# Patient Record
Sex: Male | Born: 1965 | Race: White | Hispanic: No | Marital: Single | Smoking: Current every day smoker
Health system: Southern US, Community
[De-identification: ages and names within clinical notes are randomized; demographics above are authoritative.]

## PROBLEM LIST (undated history)

## (undated) DIAGNOSIS — F32A Depression, unspecified: Secondary | ICD-10-CM

## (undated) DIAGNOSIS — S92919A Unspecified fracture of unspecified toe(s), initial encounter for closed fracture: Secondary | ICD-10-CM

## (undated) DIAGNOSIS — R200 Anesthesia of skin: Secondary | ICD-10-CM

## (undated) DIAGNOSIS — Z872 Personal history of diseases of the skin and subcutaneous tissue: Secondary | ICD-10-CM

## (undated) DIAGNOSIS — F329 Major depressive disorder, single episode, unspecified: Secondary | ICD-10-CM

## (undated) HISTORY — PX: INCISION AND DRAINAGE FOOT: SHX1800

## (undated) HISTORY — PX: OTHER SURGICAL HISTORY: SHX169

## (undated) HISTORY — PX: PYLOROMYOTOMY: SHX5274

## (undated) HISTORY — PX: CATARACT EXTRACTION W/ INTRAOCULAR LENS IMPLANT: SHX1309

---

## 1998-04-20 HISTORY — PX: HAND SURGERY: SHX662

## 1999-11-26 ENCOUNTER — Inpatient Hospital Stay (HOSPITAL_COMMUNITY): Admission: RE | Admit: 1999-11-26 | Discharge: 1999-11-29 | Payer: Self-pay | Admitting: Orthopedic Surgery

## 2010-09-11 ENCOUNTER — Emergency Department (HOSPITAL_COMMUNITY)
Admission: EM | Admit: 2010-09-11 | Discharge: 2010-09-11 | Disposition: A | Discharge status: Home / Self Care | Payer: Medicaid Other | Attending: Emergency Medicine | Admitting: Emergency Medicine

## 2010-09-11 DIAGNOSIS — R45851 Suicidal ideations: Secondary | ICD-10-CM | POA: Insufficient documentation

## 2010-09-11 DIAGNOSIS — F411 Generalized anxiety disorder: Secondary | ICD-10-CM | POA: Insufficient documentation

## 2010-09-11 DIAGNOSIS — R443 Hallucinations, unspecified: Secondary | ICD-10-CM | POA: Insufficient documentation

## 2010-09-11 LAB — CBC
Platelets: 249 10*3/uL (ref 150–400)
RDW: 12.4 % (ref 11.5–15.5)
WBC: 10.9 10*3/uL — ABNORMAL HIGH (ref 4.0–10.5)

## 2010-09-11 LAB — DIFFERENTIAL
Basophils Absolute: 0 10*3/uL (ref 0.0–0.1)
Eosinophils Absolute: 0.1 10*3/uL (ref 0.0–0.7)
Eosinophils Relative: 1 % (ref 0–5)

## 2010-09-11 LAB — COMPREHENSIVE METABOLIC PANEL
AST: 29 U/L (ref 0–37)
Alkaline Phosphatase: 89 U/L (ref 39–117)
BUN: 9 mg/dL (ref 6–23)
CO2: 23 mEq/L (ref 19–32)
Chloride: 99 mEq/L (ref 96–112)
Creatinine, Ser: 0.82 mg/dL (ref 0.4–1.5)
GFR calc Af Amer: >60 mL/min (ref >60)
GFR calc non Af Amer: >60 mL/min (ref >60)
Potassium: 3.9 mEq/L (ref 3.5–5.1)
Total Bilirubin: 0.2 mg/dL — ABNORMAL LOW (ref 0.3–1.2)

## 2010-09-11 LAB — RAPID URINE DRUG SCREEN, HOSP PERFORMED
Cocaine: NOT DETECTED
Opiates: NOT DETECTED

## 2014-09-25 ENCOUNTER — Encounter (HOSPITAL_COMMUNITY): Payer: Self-pay

## 2014-09-25 ENCOUNTER — Observation Stay (HOSPITAL_COMMUNITY)
Admission: EM | Admit: 2014-09-25 | Discharge: 2014-09-26 | Disposition: A | Discharge status: Home / Self Care | Payer: Medicaid Other | Attending: Orthopedic Surgery | Admitting: Orthopedic Surgery

## 2014-09-25 DIAGNOSIS — S92411A Displaced fracture of proximal phalanx of right great toe, initial encounter for closed fracture: Secondary | ICD-10-CM | POA: Diagnosis not present

## 2014-09-25 DIAGNOSIS — Y92410 Unspecified street and highway as the place of occurrence of the external cause: Secondary | ICD-10-CM | POA: Diagnosis not present

## 2014-09-25 DIAGNOSIS — Z23 Encounter for immunization: Secondary | ICD-10-CM | POA: Diagnosis not present

## 2014-09-25 DIAGNOSIS — M79671 Pain in right foot: Secondary | ICD-10-CM

## 2014-09-25 DIAGNOSIS — S80211A Abrasion, right knee, initial encounter: Secondary | ICD-10-CM | POA: Insufficient documentation

## 2014-09-25 DIAGNOSIS — S92521A Displaced fracture of medial phalanx of right lesser toe(s), initial encounter for closed fracture: Secondary | ICD-10-CM | POA: Diagnosis not present

## 2014-09-25 DIAGNOSIS — S92531A Displaced fracture of distal phalanx of right lesser toe(s), initial encounter for closed fracture: Secondary | ICD-10-CM | POA: Insufficient documentation

## 2014-09-25 DIAGNOSIS — F17210 Nicotine dependence, cigarettes, uncomplicated: Secondary | ICD-10-CM | POA: Diagnosis not present

## 2014-09-25 DIAGNOSIS — S92919A Unspecified fracture of unspecified toe(s), initial encounter for closed fracture: Secondary | ICD-10-CM

## 2014-09-25 DIAGNOSIS — F329 Major depressive disorder, single episode, unspecified: Secondary | ICD-10-CM | POA: Insufficient documentation

## 2014-09-25 DIAGNOSIS — S99921A Unspecified injury of right foot, initial encounter: Secondary | ICD-10-CM | POA: Diagnosis present

## 2014-09-25 DIAGNOSIS — R200 Anesthesia of skin: Secondary | ICD-10-CM

## 2014-09-25 HISTORY — DX: Anesthesia of skin: R20.0

## 2014-09-25 HISTORY — DX: Unspecified fracture of unspecified toe(s), initial encounter for closed fracture: S92.919A

## 2014-09-25 HISTORY — DX: Depression, unspecified: F32.A

## 2014-09-25 HISTORY — DX: Major depressive disorder, single episode, unspecified: F32.9

## 2014-09-25 MED ORDER — TETANUS-DIPHTH-ACELL PERTUSSIS 5-2.5-18.5 LF-MCG/0.5 IM SUSP
0.5000 mL | Freq: Once | INTRAMUSCULAR | Status: AC
  Administered 2014-09-26: 0.5 mL via INTRAMUSCULAR
  Filled 2014-09-25: qty 0.5
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
## 2014-09-26 ENCOUNTER — Emergency Department (HOSPITAL_COMMUNITY): Payer: Medicaid Other

## 2014-09-26 DIAGNOSIS — S92919A Unspecified fracture of unspecified toe(s), initial encounter for closed fracture: Secondary | ICD-10-CM | POA: Diagnosis present

## 2014-09-26 LAB — BASIC METABOLIC PANEL
ANION GAP: 9 (ref 5–15)
BUN: 8 mg/dL (ref 6–20)
CALCIUM: 8.8 mg/dL — AB (ref 8.9–10.3)
CO2: 23 mmol/L (ref 22–32)
CREATININE: 0.86 mg/dL (ref 0.61–1.24)
Chloride: 105 mmol/L (ref 101–111)
GFR calc Af Amer: >60 mL/min (ref >60)
GFR calc non Af Amer: >60 mL/min (ref >60)
GLUCOSE: 115 mg/dL — AB (ref 65–99)
Potassium: 3.7 mmol/L (ref 3.5–5.1)
Sodium: 137 mmol/L (ref 135–145)

## 2014-09-26 LAB — CBC WITH DIFFERENTIAL/PLATELET
BASOS ABS: 0 10*3/uL (ref 0.0–0.1)
Basophils Relative: 0 % (ref 0–1)
EOS ABS: 0.1 10*3/uL (ref 0.0–0.7)
EOS PCT: 1 % (ref 0–5)
HCT: 41.9 % (ref 39.0–52.0)
Hemoglobin: 14.5 g/dL (ref 13.0–17.0)
LYMPHS ABS: 2.2 10*3/uL (ref 0.7–4.0)
Lymphocytes Relative: 24 % (ref 12–46)
MCH: 30.8 pg (ref 26.0–34.0)
MCHC: 34.6 g/dL (ref 30.0–36.0)
MCV: 89 fL (ref 78.0–100.0)
Monocytes Absolute: 0.4 10*3/uL (ref 0.1–1.0)
Monocytes Relative: 4 % (ref 3–12)
NEUTROS PCT: 71 % (ref 43–77)
Neutro Abs: 6.2 10*3/uL (ref 1.7–7.7)
Platelets: 242 10*3/uL (ref 150–400)
RBC: 4.71 MIL/uL (ref 4.22–5.81)
RDW: 12.5 % (ref 11.5–15.5)
WBC: 8.9 10*3/uL (ref 4.0–10.5)

## 2014-09-26 MED ORDER — SENNOSIDES-DOCUSATE SODIUM 8.6-50 MG PO TABS
1.0000 | ORAL_TABLET | Freq: Every evening | ORAL | Status: DC | PRN
  Filled 2014-09-26: qty 1

## 2014-09-26 MED ORDER — OXYCODONE HCL 5 MG PO TABS
5.0000 mg | ORAL_TABLET | ORAL | Status: DC | PRN
  Administered 2014-09-26: 5 mg via ORAL
  Filled 2014-09-26: qty 1

## 2014-09-26 MED ORDER — HYDROMORPHONE HCL 1 MG/ML IJ SOLN
1.0000 mg | Freq: Once | INTRAMUSCULAR | Status: AC
  Administered 2014-09-26: 1 mg via INTRAVENOUS
  Filled 2014-09-26: qty 1

## 2014-09-26 NOTE — ED Notes (Signed)
Right foot is very swollen and top of his foot is blanched  However good cap refill. States he can't feel when pressure is applied to right 2nd and 4th toe. Dr. Littie DeedsGentry at bedside. Positive pulse with doppler.
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
## 2014-09-26 NOTE — ED Notes (Signed)
Hewitt MD came to see pt.  Reported to RN that pt will be discharged and will follow up with specialist next week for surgery.  Prescription for Percocet given.  ED MD made aware.

## 2014-09-26 NOTE — ED Notes (Signed)
Ice applied to right foot. And elevated

## 2014-09-26 NOTE — ED Notes (Signed)
Pt removed self from all monitoring states, "my heart is good."

## 2014-09-27 ENCOUNTER — Encounter (HOSPITAL_COMMUNITY): Payer: Self-pay | Admitting: Emergency Medicine

## 2014-09-27 ENCOUNTER — Emergency Department (HOSPITAL_COMMUNITY)
Admission: EM | Admit: 2014-09-27 | Discharge: 2014-09-28 | Disposition: A | Discharge status: Home / Self Care | Payer: Medicaid Other | Attending: Emergency Medicine | Admitting: Emergency Medicine

## 2014-09-27 DIAGNOSIS — S7011XD Contusion of right thigh, subsequent encounter: Secondary | ICD-10-CM | POA: Insufficient documentation

## 2014-09-27 DIAGNOSIS — S99921D Unspecified injury of right foot, subsequent encounter: Secondary | ICD-10-CM | POA: Diagnosis not present

## 2014-09-27 DIAGNOSIS — Z720 Tobacco use: Secondary | ICD-10-CM | POA: Insufficient documentation

## 2014-09-27 DIAGNOSIS — S9031XD Contusion of right foot, subsequent encounter: Secondary | ICD-10-CM | POA: Insufficient documentation

## 2014-09-27 DIAGNOSIS — Z8781 Personal history of (healed) traumatic fracture: Secondary | ICD-10-CM | POA: Diagnosis not present

## 2014-09-27 DIAGNOSIS — Z79899 Other long term (current) drug therapy: Secondary | ICD-10-CM | POA: Diagnosis not present

## 2014-09-27 DIAGNOSIS — F329 Major depressive disorder, single episode, unspecified: Secondary | ICD-10-CM | POA: Insufficient documentation

## 2014-09-27 DIAGNOSIS — R200 Anesthesia of skin: Secondary | ICD-10-CM | POA: Diagnosis present
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
## 2014-09-28 LAB — CBC WITH DIFFERENTIAL/PLATELET
BASOS PCT: 0 % (ref 0–1)
Basophils Absolute: 0 10*3/uL (ref 0.0–0.1)
Eosinophils Absolute: 0.3 10*3/uL (ref 0.0–0.7)
Eosinophils Relative: 3 % (ref 0–5)
HCT: 38.9 % — ABNORMAL LOW (ref 39.0–52.0)
Hemoglobin: 13.3 g/dL (ref 13.0–17.0)
LYMPHS ABS: 1.7 10*3/uL (ref 0.7–4.0)
Lymphocytes Relative: 15 % (ref 12–46)
MCH: 31.1 pg (ref 26.0–34.0)
MCHC: 34.2 g/dL (ref 30.0–36.0)
MCV: 90.9 fL (ref 78.0–100.0)
Monocytes Absolute: 0.5 10*3/uL (ref 0.1–1.0)
Monocytes Relative: 5 % (ref 3–12)
Neutro Abs: 8.8 10*3/uL — ABNORMAL HIGH (ref 1.7–7.7)
Neutrophils Relative %: 77 % (ref 43–77)
Platelets: 234 10*3/uL (ref 150–400)
RBC: 4.28 MIL/uL (ref 4.22–5.81)
RDW: 12.7 % (ref 11.5–15.5)
WBC: 11.3 10*3/uL — AB (ref 4.0–10.5)

## 2014-09-28 LAB — BASIC METABOLIC PANEL
Anion gap: 9 (ref 5–15)
BUN: 12 mg/dL (ref 6–20)
CO2: 27 mmol/L (ref 22–32)
CREATININE: 0.9 mg/dL (ref 0.61–1.24)
Calcium: 9.2 mg/dL (ref 8.9–10.3)
Chloride: 99 mmol/L — ABNORMAL LOW (ref 101–111)
GFR calc Af Amer: >60 mL/min (ref >60)
Glucose, Bld: 128 mg/dL — ABNORMAL HIGH (ref 65–99)
Potassium: 3.9 mmol/L (ref 3.5–5.1)
Sodium: 135 mmol/L (ref 135–145)

## 2014-09-28 MED ORDER — CEFAZOLIN SODIUM 1-5 GM-% IV SOLN
1.0000 g | Freq: Once | INTRAVENOUS | Status: AC
  Administered 2014-09-28: 1 g via INTRAVENOUS
  Filled 2014-09-28: qty 50

## 2014-09-28 MED ORDER — MORPHINE SULFATE 4 MG/ML IJ SOLN
4.0000 mg | Freq: Once | INTRAMUSCULAR | Status: AC
  Administered 2014-09-28: 4 mg via INTRAVENOUS
  Filled 2014-09-28: qty 1

## 2014-09-28 MED ORDER — CEPHALEXIN 500 MG PO CAPS: 500.0000 mg | ORAL_CAPSULE | Freq: Four times a day (QID) | ORAL | Status: DC

## 2014-09-28 NOTE — Discharge Instructions (Signed)
Call [facility] orthopedic office in the morning to schedule appointment to be seen tomorrow. Do not bear weight on your right lower extremity. Keep the foot elevated above the heart. Take antibiotics as prescribed. Return immediately for worsening pain, swelling, fever or any concerns.

## 2014-10-11 ENCOUNTER — Emergency Department (HOSPITAL_COMMUNITY)
Admission: EM | Admit: 2014-10-11 | Discharge: 2014-10-11 | Disposition: A | Discharge status: Home / Self Care | Payer: Medicaid Other | Attending: Emergency Medicine | Admitting: Emergency Medicine

## 2014-10-11 ENCOUNTER — Emergency Department (HOSPITAL_COMMUNITY): Payer: Medicaid Other

## 2014-10-11 ENCOUNTER — Encounter (HOSPITAL_COMMUNITY): Payer: Self-pay | Admitting: *Deleted

## 2014-10-11 DIAGNOSIS — S92512D Displaced fracture of proximal phalanx of left lesser toe(s), subsequent encounter for fracture with routine healing: Secondary | ICD-10-CM | POA: Diagnosis not present

## 2014-10-11 DIAGNOSIS — F329 Major depressive disorder, single episode, unspecified: Secondary | ICD-10-CM | POA: Diagnosis not present

## 2014-10-11 DIAGNOSIS — Z79899 Other long term (current) drug therapy: Secondary | ICD-10-CM | POA: Insufficient documentation

## 2014-10-11 DIAGNOSIS — L03119 Cellulitis of unspecified part of limb: Secondary | ICD-10-CM

## 2014-10-11 DIAGNOSIS — S92531D Displaced fracture of distal phalanx of right lesser toe(s), subsequent encounter for fracture with routine healing: Secondary | ICD-10-CM | POA: Insufficient documentation

## 2014-10-11 DIAGNOSIS — Z7982 Long term (current) use of aspirin: Secondary | ICD-10-CM | POA: Insufficient documentation

## 2014-10-11 DIAGNOSIS — L03115 Cellulitis of right lower limb: Secondary | ICD-10-CM | POA: Insufficient documentation

## 2014-10-11 DIAGNOSIS — M71071 Abscess of bursa, right ankle and foot: Secondary | ICD-10-CM | POA: Diagnosis not present

## 2014-10-11 DIAGNOSIS — S92911G Unspecified fracture of right toe(s), subsequent encounter for fracture with delayed healing: Secondary | ICD-10-CM

## 2014-10-11 DIAGNOSIS — Z720 Tobacco use: Secondary | ICD-10-CM | POA: Insufficient documentation

## 2014-10-11 DIAGNOSIS — L02619 Cutaneous abscess of unspecified foot: Secondary | ICD-10-CM

## 2014-10-11 DIAGNOSIS — M79671 Pain in right foot: Secondary | ICD-10-CM | POA: Diagnosis present

## 2014-10-11 LAB — CBC WITH DIFFERENTIAL/PLATELET
BASOS PCT: 0 % (ref 0–1)
Basophils Absolute: 0 10*3/uL (ref 0.0–0.1)
Eosinophils Absolute: 0.3 10*3/uL (ref 0.0–0.7)
Eosinophils Relative: 3 % (ref 0–5)
HCT: 36.4 % — ABNORMAL LOW (ref 39.0–52.0)
Hemoglobin: 12.3 g/dL — ABNORMAL LOW (ref 13.0–17.0)
Lymphocytes Relative: 24 % (ref 12–46)
Lymphs Abs: 2.5 10*3/uL (ref 0.7–4.0)
MCH: 30.4 pg (ref 26.0–34.0)
MCHC: 33.8 g/dL (ref 30.0–36.0)
MCV: 89.9 fL (ref 78.0–100.0)
MONOS PCT: 5 % (ref 3–12)
Monocytes Absolute: 0.5 10*3/uL (ref 0.1–1.0)
NEUTROS ABS: 7.1 10*3/uL (ref 1.7–7.7)
Neutrophils Relative %: 68 % (ref 43–77)
Platelets: 487 10*3/uL — ABNORMAL HIGH (ref 150–400)
RBC: 4.05 MIL/uL — AB (ref 4.22–5.81)
RDW: 12.2 % (ref 11.5–15.5)
WBC: 10.4 10*3/uL (ref 4.0–10.5)

## 2014-10-11 LAB — COMPREHENSIVE METABOLIC PANEL
ALT: 16 U/L — AB (ref 17–63)
AST: 16 U/L (ref 15–41)
Albumin: 3.4 g/dL — ABNORMAL LOW (ref 3.5–5.0)
Alkaline Phosphatase: 53 U/L (ref 38–126)
Anion gap: 12 (ref 5–15)
BUN: 19 mg/dL (ref 6–20)
CALCIUM: 9 mg/dL (ref 8.9–10.3)
CHLORIDE: 107 mmol/L (ref 101–111)
CO2: 20 mmol/L — ABNORMAL LOW (ref 22–32)
Creatinine, Ser: 1.01 mg/dL (ref 0.61–1.24)
GFR calc Af Amer: >60 mL/min (ref >60)
GFR calc non Af Amer: >60 mL/min (ref >60)
Glucose, Bld: 83 mg/dL (ref 65–99)
Potassium: 3.8 mmol/L (ref 3.5–5.1)
SODIUM: 139 mmol/L (ref 135–145)
Total Bilirubin: 0.3 mg/dL (ref 0.3–1.2)
Total Protein: 6.9 g/dL (ref 6.5–8.1)

## 2014-10-11 LAB — I-STAT CG4 LACTIC ACID, ED: Lactic Acid, Venous: 0.73 mmol/L (ref 0.5–2.0)

## 2014-10-11 MED ORDER — CEPHALEXIN 500 MG PO CAPS: 500.0000 mg | ORAL_CAPSULE | Freq: Four times a day (QID) | ORAL | Status: DC

## 2014-10-11 MED ORDER — PIPERACILLIN-TAZOBACTAM 3.375 G IVPB 30 MIN
3.3750 g | Freq: Once | INTRAVENOUS | Status: AC
  Administered 2014-10-11: 3.375 g via INTRAVENOUS
  Filled 2014-10-11: qty 50

## 2014-10-11 MED ORDER — ACETAMINOPHEN 500 MG PO TABS
1000.0000 mg | ORAL_TABLET | Freq: Once | ORAL | Status: AC
  Administered 2014-10-11: 1000 mg via ORAL
  Filled 2014-10-11: qty 2

## 2014-10-11 NOTE — ED Notes (Signed)
Rt foot was cleaned with normal saline and wrapped with vaseline gauze, gauze, kling, and ace wrap.
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
## 2014-10-11 NOTE — Discharge Instructions (Signed)
Take keflex as prescribed.   Take tylenol, motrin for pain.   See Dr. Victorino Dike for follow up.   Return to ER if you have fever, worse swelling, more purulent discharge from the wound.

## 2014-10-21 ENCOUNTER — Emergency Department (HOSPITAL_COMMUNITY): Payer: Medicaid Other

## 2014-10-21 ENCOUNTER — Inpatient Hospital Stay (HOSPITAL_COMMUNITY)
Admission: EM | Admit: 2014-10-21 | Discharge: 2014-10-24 | DRG: 603 | Disposition: A | Discharge status: Home / Self Care | Payer: Medicaid Other | Attending: Internal Medicine | Admitting: Internal Medicine

## 2014-10-21 ENCOUNTER — Encounter (HOSPITAL_COMMUNITY): Payer: Self-pay | Admitting: Emergency Medicine

## 2014-10-21 ENCOUNTER — Inpatient Hospital Stay (HOSPITAL_COMMUNITY): Payer: Medicaid Other

## 2014-10-21 DIAGNOSIS — S92911A Unspecified fracture of right toe(s), initial encounter for closed fracture: Secondary | ICD-10-CM | POA: Diagnosis present

## 2014-10-21 DIAGNOSIS — F32A Depression, unspecified: Secondary | ICD-10-CM | POA: Diagnosis present

## 2014-10-21 DIAGNOSIS — D649 Anemia, unspecified: Secondary | ICD-10-CM | POA: Diagnosis present

## 2014-10-21 DIAGNOSIS — Z720 Tobacco use: Secondary | ICD-10-CM | POA: Diagnosis present

## 2014-10-21 DIAGNOSIS — F17210 Nicotine dependence, cigarettes, uncomplicated: Secondary | ICD-10-CM | POA: Diagnosis present

## 2014-10-21 DIAGNOSIS — L089 Local infection of the skin and subcutaneous tissue, unspecified: Secondary | ICD-10-CM

## 2014-10-21 DIAGNOSIS — L0390 Cellulitis, unspecified: Secondary | ICD-10-CM

## 2014-10-21 DIAGNOSIS — B9689 Other specified bacterial agents as the cause of diseases classified elsewhere: Secondary | ICD-10-CM | POA: Diagnosis not present

## 2014-10-21 DIAGNOSIS — B9562 Methicillin resistant Staphylococcus aureus infection as the cause of diseases classified elsewhere: Secondary | ICD-10-CM | POA: Diagnosis not present

## 2014-10-21 DIAGNOSIS — Z872 Personal history of diseases of the skin and subcutaneous tissue: Secondary | ICD-10-CM

## 2014-10-21 DIAGNOSIS — X58XXXS Exposure to other specified factors, sequela: Secondary | ICD-10-CM | POA: Diagnosis present

## 2014-10-21 DIAGNOSIS — L03115 Cellulitis of right lower limb: Secondary | ICD-10-CM | POA: Diagnosis present

## 2014-10-21 DIAGNOSIS — Z885 Allergy status to narcotic agent status: Secondary | ICD-10-CM | POA: Diagnosis not present

## 2014-10-21 DIAGNOSIS — S8011XA Contusion of right lower leg, initial encounter: Secondary | ICD-10-CM | POA: Diagnosis present

## 2014-10-21 DIAGNOSIS — F329 Major depressive disorder, single episode, unspecified: Secondary | ICD-10-CM | POA: Diagnosis present

## 2014-10-21 DIAGNOSIS — S92911G Unspecified fracture of right toe(s), subsequent encounter for fracture with delayed healing: Secondary | ICD-10-CM

## 2014-10-21 DIAGNOSIS — L02415 Cutaneous abscess of right lower limb: Secondary | ICD-10-CM | POA: Diagnosis not present

## 2014-10-21 DIAGNOSIS — S91301S Unspecified open wound, right foot, sequela: Secondary | ICD-10-CM | POA: Diagnosis not present

## 2014-10-21 DIAGNOSIS — S92919A Unspecified fracture of unspecified toe(s), initial encounter for closed fracture: Secondary | ICD-10-CM | POA: Diagnosis present

## 2014-10-21 DIAGNOSIS — M86171 Other acute osteomyelitis, right ankle and foot: Secondary | ICD-10-CM | POA: Diagnosis not present

## 2014-10-21 HISTORY — DX: Personal history of diseases of the skin and subcutaneous tissue: Z87.2

## 2014-10-21 LAB — CBC
HEMATOCRIT: 35.3 % — AB (ref 39.0–52.0)
Hemoglobin: 12.1 g/dL — ABNORMAL LOW (ref 13.0–17.0)
MCH: 30.2 pg (ref 26.0–34.0)
MCHC: 34.3 g/dL (ref 30.0–36.0)
MCV: 88 fL (ref 78.0–100.0)
Platelets: 346 10*3/uL (ref 150–400)
RBC: 4.01 MIL/uL — AB (ref 4.22–5.81)
RDW: 12.5 % (ref 11.5–15.5)
WBC: 10.4 10*3/uL (ref 4.0–10.5)

## 2014-10-21 LAB — BASIC METABOLIC PANEL
Anion gap: 10 (ref 5–15)
BUN: 11 mg/dL (ref 6–20)
CHLORIDE: 98 mmol/L — AB (ref 101–111)
CO2: 23 mmol/L (ref 22–32)
Calcium: 8.5 mg/dL — ABNORMAL LOW (ref 8.9–10.3)
Creatinine, Ser: 0.83 mg/dL (ref 0.61–1.24)
GFR calc Af Amer: >60 mL/min (ref >60)
GFR calc non Af Amer: >60 mL/min (ref >60)
GLUCOSE: 86 mg/dL (ref 65–99)
POTASSIUM: 3.6 mmol/L (ref 3.5–5.1)
SODIUM: 131 mmol/L — AB (ref 135–145)

## 2014-10-21 MED ORDER — ONDANSETRON HCL 4 MG/2ML IJ SOLN
4.0000 mg | Freq: Once | INTRAMUSCULAR | Status: AC
  Administered 2014-10-21: 4 mg via INTRAVENOUS
  Filled 2014-10-21: qty 2

## 2014-10-21 MED ORDER — VANCOMYCIN HCL IN DEXTROSE 1-5 GM/200ML-% IV SOLN
1000.0000 mg | Freq: Three times a day (TID) | INTRAVENOUS | Status: DC
  Administered 2014-10-21 – 2014-10-24 (×8): 1000 mg via INTRAVENOUS
  Filled 2014-10-21 (×10): qty 200

## 2014-10-21 MED ORDER — PIPERACILLIN-TAZOBACTAM 3.375 G IVPB
3.3750 g | Freq: Three times a day (TID) | INTRAVENOUS | Status: DC
  Administered 2014-10-22 – 2014-10-24 (×7): 3.375 g via INTRAVENOUS
  Filled 2014-10-21 (×10): qty 50

## 2014-10-21 MED ORDER — MORPHINE SULFATE 4 MG/ML IJ SOLN
4.0000 mg | INTRAMUSCULAR | Status: DC | PRN
  Administered 2014-10-21: 4 mg via INTRAVENOUS
  Filled 2014-10-21: qty 1

## 2014-10-21 MED ORDER — PIPERACILLIN-TAZOBACTAM 3.375 G IVPB 30 MIN
3.3750 g | Freq: Once | INTRAVENOUS | Status: AC
  Administered 2014-10-22: 3.375 g via INTRAVENOUS
  Filled 2014-10-21: qty 50

## 2014-10-21 NOTE — ED Notes (Signed)
Pt now being transported to MRI first and then to [facility] after MRI.
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
## 2014-10-21 NOTE — Progress Notes (Addendum)
ANTIBIOTIC CONSULT NOTE - INITIAL  Pharmacy Consult for Vancomycin and Zosyn Indication: Pneumonia? Cellulitis?  No Known Allergies  Patient Measurements: Height: 5\' 8"  (172.7 cm) Weight: 211 lb 3 oz (95.794 kg) IBW/kg (Calculated) : 68.4  Vital Signs: Temp: 97.8 F (36.6 C) (07/03 1934) Temp Source: Oral (07/03 1934) BP: 133/69 mmHg (07/03 2043) Pulse Rate: 72 (07/03 2043) Intake/Output from previous day:   Intake/Output from this shift:    Labs:  Recent Labs  10/21/14 1940  WBC 10.4  HGB 12.1*  PLT 346  CREATININE 0.83   Estimated Creatinine Clearance: 122.2 mL/min (by C-G formula based on Cr of 0.83). No results for input(s): VANCOTROUGH, VANCOPEAK, VANCORANDOM, GENTTROUGH, GENTPEAK, GENTRANDOM, TOBRATROUGH, TOBRAPEAK, TOBRARND, AMIKACINPEAK, AMIKACINTROU, AMIKACIN in the last 72 hours.   Microbiology: No results found for this or any previous visit (from the past 720 hour(s)).  Medical History: Past Medical History  Diagnosis Date  . Depression     Assessment: [age] YOM with worsening right foot wound infection for several weeks, with swelling and drainage, no improvement with po keflex. Pharmacy to dose vancomycin. Scr 0.83, est. crcl > 100 ml/min   Goal of Therapy:  Vancomycin trough level 10-15 mcg/ml  Plan:  - Vancomycin 1g IV Q 8 hrs - Monitor renal function, f/u clinical progress and additional indications - vancomycin trough at steady state if needed.  Bayard HuggerMei Bell, PharmD, BCPS  Clinical Pharmacist  Pager: [phone]   10/21/2014,9:04 PM   ADDENDUM: Now to broaden IV ABX w/ Zosyn.  Will start Zosyn 3.375g IV Q8H for CrCl >100. Vernard GamblesVeronda Kristyna Bradstreet, PharmD, BCPS 10/21/2014 11:55 PM
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
## 2014-10-21 NOTE — ED Notes (Signed)
Pt. reports worsening right foot/toes wound infection for several weeks with swelling and drainage , pt. stated he has been treated with oral and IV antibiotics with no improvement , did not follow up with his MD due to financial/insurance constraints.

## 2014-10-22 ENCOUNTER — Encounter (HOSPITAL_COMMUNITY): Payer: Self-pay | Admitting: Internal Medicine

## 2014-10-22 DIAGNOSIS — S92911S Unspecified fracture of right toe(s), sequela: Secondary | ICD-10-CM

## 2014-10-22 DIAGNOSIS — Z720 Tobacco use: Secondary | ICD-10-CM

## 2014-10-22 DIAGNOSIS — F329 Major depressive disorder, single episode, unspecified: Secondary | ICD-10-CM

## 2014-10-22 LAB — TYPE AND SCREEN
ABO/RH(D): A POS
Antibody Screen: NEGATIVE

## 2014-10-22 LAB — RAPID URINE DRUG SCREEN, HOSP PERFORMED
AMPHETAMINES: NOT DETECTED
BENZODIAZEPINES: NOT DETECTED
Barbiturates: NOT DETECTED
COCAINE: NOT DETECTED
OPIATES: POSITIVE — AB
Tetrahydrocannabinol: POSITIVE — AB

## 2014-10-22 LAB — PROCALCITONIN

## 2014-10-22 LAB — CBC
HCT: 34.9 % — ABNORMAL LOW (ref 39.0–52.0)
Hemoglobin: 11.7 g/dL — ABNORMAL LOW (ref 13.0–17.0)
MCH: 29.8 pg (ref 26.0–34.0)
MCHC: 33.5 g/dL (ref 30.0–36.0)
MCV: 89 fL (ref 78.0–100.0)
Platelets: 295 10*3/uL (ref 150–400)
RBC: 3.92 MIL/uL — AB (ref 4.22–5.81)
RDW: 12.7 % (ref 11.5–15.5)
WBC: 9.1 10*3/uL (ref 4.0–10.5)

## 2014-10-22 LAB — COMPREHENSIVE METABOLIC PANEL
ALK PHOS: 56 U/L (ref 38–126)
ALT: 16 U/L — ABNORMAL LOW (ref 17–63)
AST: 14 U/L — ABNORMAL LOW (ref 15–41)
Albumin: 2.7 g/dL — ABNORMAL LOW (ref 3.5–5.0)
Anion gap: 7 (ref 5–15)
BUN: 10 mg/dL (ref 6–20)
CO2: 26 mmol/L (ref 22–32)
CREATININE: 0.85 mg/dL (ref 0.61–1.24)
Calcium: 8.5 mg/dL — ABNORMAL LOW (ref 8.9–10.3)
Chloride: 105 mmol/L (ref 101–111)
GFR calc Af Amer: >60 mL/min (ref >60)
Glucose, Bld: 103 mg/dL — ABNORMAL HIGH (ref 65–99)
POTASSIUM: 4.2 mmol/L (ref 3.5–5.1)
Sodium: 138 mmol/L (ref 135–145)
Total Bilirubin: 0.4 mg/dL (ref 0.3–1.2)
Total Protein: 5.7 g/dL — ABNORMAL LOW (ref 6.5–8.1)

## 2014-10-22 LAB — ABO/RH: ABO/RH(D): A POS

## 2014-10-22 LAB — LACTIC ACID, PLASMA
LACTIC ACID, VENOUS: 0.9 mmol/L (ref 0.5–2.0)
Lactic Acid, Venous: 0.7 mmol/L (ref 0.5–2.0)

## 2014-10-22 LAB — PROTIME-INR
INR: 1.02 (ref 0.00–1.49)
PROTHROMBIN TIME: 13.6 s (ref 11.6–15.2)

## 2014-10-22 LAB — GLUCOSE, CAPILLARY: Glucose-Capillary: 109 mg/dL — ABNORMAL HIGH (ref 65–99)

## 2014-10-22 LAB — APTT: aPTT: 35 seconds (ref 24–37)

## 2014-10-22 LAB — HIV ANTIBODY (ROUTINE TESTING W REFLEX): HIV SCREEN 4TH GENERATION: NONREACTIVE

## 2014-10-22 LAB — SURGICAL PCR SCREEN
MRSA, PCR: NEGATIVE
Staphylococcus aureus: NEGATIVE

## 2014-10-22 MED ORDER — HEPARIN SODIUM (PORCINE) 5000 UNIT/ML IJ SOLN
5000.0000 [IU] | Freq: Three times a day (TID) | INTRAMUSCULAR | Status: DC
  Administered 2014-10-22 – 2014-10-24 (×7): 5000 [IU] via SUBCUTANEOUS
  Filled 2014-10-22 (×7): qty 1

## 2014-10-22 MED ORDER — MORPHINE SULFATE 2 MG/ML IJ SOLN
2.0000 mg | INTRAMUSCULAR | Status: AC | PRN
  Administered 2014-10-22 (×2): 2 mg via INTRAVENOUS
  Filled 2014-10-22 (×2): qty 1

## 2014-10-22 MED ORDER — ALUM & MAG HYDROXIDE-SIMETH 200-200-20 MG/5ML PO SUSP: 30.0000 mL | Freq: Four times a day (QID) | ORAL | Status: DC | PRN

## 2014-10-22 MED ORDER — NICOTINE 21 MG/24HR TD PT24
21.0000 mg | MEDICATED_PATCH | Freq: Every day | TRANSDERMAL | Status: DC
  Administered 2014-10-22: 21 mg via TRANSDERMAL
  Filled 2014-10-22 (×3): qty 1

## 2014-10-22 MED ORDER — ACETAMINOPHEN 650 MG RE SUPP
650.0000 mg | Freq: Four times a day (QID) | RECTAL | Status: DC | PRN
Start: 2014-10-22 — End: 2014-10-24

## 2014-10-22 MED ORDER — OXYCODONE-ACETAMINOPHEN 5-325 MG PO TABS
2.0000 | ORAL_TABLET | Freq: Four times a day (QID) | ORAL | Status: DC | PRN
  Administered 2014-10-22: 2 via ORAL
  Filled 2014-10-22: qty 2

## 2014-10-22 MED ORDER — ACETAMINOPHEN 325 MG PO TABS
650.0000 mg | ORAL_TABLET | Freq: Four times a day (QID) | ORAL | Status: DC | PRN
  Administered 2014-10-22: 650 mg via ORAL
  Filled 2014-10-22: qty 2

## 2014-10-22 MED ORDER — SODIUM CHLORIDE 0.9 % IV BOLUS (SEPSIS): 1000.0000 mL | Freq: Once | INTRAVENOUS | Status: DC

## 2014-10-22 MED ORDER — OXYCODONE-ACETAMINOPHEN 5-325 MG PO TABS
1.0000 | ORAL_TABLET | Freq: Four times a day (QID) | ORAL | Status: DC | PRN
  Administered 2014-10-23 – 2014-10-24 (×5): 2 via ORAL
  Filled 2014-10-22 (×6): qty 2

## 2014-10-22 MED ORDER — ONDANSETRON HCL 4 MG/2ML IJ SOLN: 4.0000 mg | Freq: Three times a day (TID) | INTRAMUSCULAR | Status: DC | PRN

## 2014-10-22 MED ORDER — CITALOPRAM HYDROBROMIDE 20 MG PO TABS
20.0000 mg | ORAL_TABLET | Freq: Every day | ORAL | Status: DC
  Administered 2014-10-22 – 2014-10-24 (×3): 20 mg via ORAL
  Filled 2014-10-22 (×3): qty 1

## 2014-10-22 MED ORDER — SODIUM CHLORIDE 0.9 % IV SOLN
INTRAVENOUS | Status: DC
  Administered 2014-10-22: 1 mL via INTRAVENOUS

## 2014-10-22 NOTE — Care Management (Signed)
UR completed 

## 2014-10-22 NOTE — Care Management (Signed)
Important Message  Patient Details  Name: [name] [name] MRN: [id number] Date of Birth: [birth year]   Medicare Important Message Given:  Yes-second notification given    Bernadette HoitShoffner, Riddik Senna Coleman 10/22/2014, 10:07 AM
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
## 2014-10-23 ENCOUNTER — Inpatient Hospital Stay (HOSPITAL_COMMUNITY): Payer: Medicaid Other

## 2014-10-23 DIAGNOSIS — S92911D Unspecified fracture of right toe(s), subsequent encounter for fracture with routine healing: Secondary | ICD-10-CM

## 2014-10-23 DIAGNOSIS — M7989 Other specified soft tissue disorders: Secondary | ICD-10-CM

## 2014-10-23 DIAGNOSIS — M79609 Pain in unspecified limb: Secondary | ICD-10-CM

## 2014-10-23 DIAGNOSIS — D649 Anemia, unspecified: Secondary | ICD-10-CM

## 2014-10-23 LAB — GLUCOSE, CAPILLARY: GLUCOSE-CAPILLARY: 112 mg/dL — AB (ref 65–99)

## 2014-10-23 NOTE — Care Management Note (Signed)
Case Management Note  Patient Details  Name: [name] [name] MRN: [id number] Date of Birth: [birth year]  Subjective/Objective:                    Action/Plan: [facility] . Provided patient with MATCH letter , patient aware pain medications not covered . Patient states he has no insurance .   Expected Discharge Date:                  Expected Discharge Plan:  Home/Self Care  In-House Referral:     Discharge planning Services  CM Consult, MATCH Program, Medication Assistance, Indigent Health Clinic  Post Acute Care Choice:  Durable Medical Equipment Choice offered to:     DME Arranged:  Walker rolling DME Agency:  [facility]  [facility]     Status of Service:  Completed, signed off  Medicare Important Message Given:  Yes-second notification given Date Medicare IM Given:    Medicare IM give by:    Date Additional Medicare IM Given:    Additional Medicare Important Message give by:     If discussed at Long Length of Stay Meetings, dates discussed:    Additional Comments:  Kingsley PlanWile, Casy Brunetto Marie, RN 10/23/2014, 3:08 PM
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
## 2014-10-23 NOTE — Progress Notes (Signed)
Preliminary results by tech -  Right Lower Ext. Venous Duplex Completed. Negative for deep and superficial vein thrombosis in the right leg. A large hematoma greater than 11 cm is noted in the distal thigh/knee area.  Marilynne Halstedita Lalena Salas, BS, RDMS, RVT

## 2014-10-24 DIAGNOSIS — S92911A Unspecified fracture of right toe(s), initial encounter for closed fracture: Secondary | ICD-10-CM

## 2014-10-24 DIAGNOSIS — L03115 Cellulitis of right lower limb: Principal | ICD-10-CM

## 2014-10-24 LAB — CBC
HCT: 36.7 % — ABNORMAL LOW (ref 39.0–52.0)
Hemoglobin: 12.3 g/dL — ABNORMAL LOW (ref 13.0–17.0)
MCH: 30.1 pg (ref 26.0–34.0)
MCHC: 33.5 g/dL (ref 30.0–36.0)
MCV: 89.7 fL (ref 78.0–100.0)
Platelets: 328 10*3/uL (ref 150–400)
RBC: 4.09 MIL/uL — ABNORMAL LOW (ref 4.22–5.81)
RDW: 12.8 % (ref 11.5–15.5)
WBC: 8.9 10*3/uL (ref 4.0–10.5)

## 2014-10-24 LAB — GLUCOSE, CAPILLARY: Glucose-Capillary: 102 mg/dL — ABNORMAL HIGH (ref 65–99)

## 2014-10-24 LAB — VANCOMYCIN, TROUGH: Vancomycin Tr: 20 ug/mL (ref 10.0–20.0)

## 2014-10-24 MED ORDER — VANCOMYCIN HCL IN DEXTROSE 1-5 GM/200ML-% IV SOLN
1000.0000 mg | Freq: Two times a day (BID) | INTRAVENOUS | Status: DC
  Filled 2014-10-24: qty 200

## 2014-10-24 MED ORDER — IBUPROFEN 200 MG PO TABS: 800.0000 mg | ORAL_TABLET | Freq: Three times a day (TID) | ORAL | Status: DC | PRN

## 2014-10-24 MED ORDER — SULFAMETHOXAZOLE-TRIMETHOPRIM 800-160 MG PO TABS: 1.0000 | ORAL_TABLET | Freq: Two times a day (BID) | ORAL | Status: DC

## 2014-10-24 NOTE — Progress Notes (Signed)
Subjective: Pt says his foot is feeling much better.  Denies f/c//n/v/.   Objective: Vital signs in last 24 hours: Temp:  [98.2 F (36.8 C)-98.9 F (37.2 C)] 98.4 F (36.9 C) (07/06 0558) Pulse Rate:  [78-96] 96 (07/06 0558) Resp:  [16-18] 16 (07/06 0558) BP: (114-117)/(47-66) 115/54 mmHg (07/06 0558) SpO2:  [96 %-98 %] 97 % (07/06 0558)  Intake/Output from previous day: 07/05 0701 - 07/06 0700 In: 702 [P.O.:702] Out: -  Intake/Output this shift:     Recent Labs  10/21/14 1940 10/22/14 0311  HGB 12.1* 11.7*    Recent Labs  10/21/14 1940 10/22/14 0311  WBC 10.4 9.1  RBC 4.01* 3.92*  HCT 35.3* 34.9*  PLT 346 295    Recent Labs  10/21/14 1940 10/22/14 0311  NA 131* 138  K 3.6 4.2  CL 98* 105  CO2 23 26  BUN 11 10  CREATININE 0.83 0.85  GLUCOSE 86 103*  CALCIUM 8.5* 8.5*    Recent Labs  10/22/14 0128  INR 1.02    PE:  wn wd male i nad.  Foot is dressed and dry.  3rd toe with eschar.  No lymphangiitis  Assessment/Plan: R foot cellulitis after crush injury and multiple forefoot fractures - continue NWB immobilization and elevation.  Oral abx per hospitalist.  We'll plan OR for next Thursday for the hallux and hopefully debride some of the dead skin then.   Toni ArthursHEWITT, Vernetta Dizdarevic 10/24/2014, 7:41 AM

## 2014-10-24 NOTE — Progress Notes (Signed)
ANTIBIOTIC CONSULT NOTE - FOLLOW UP  Pharmacy Consult:  Vancomycin / Zosyn Indication:  Cellulitis  Allergies  Allergen Reactions  . Hydrocodone-Acetaminophen     Constipation, bleeding when urinating    Patient Measurements: Height: 5\' 8"  (172.7 cm) Weight: 213 lb 3 oz (96.7 kg) IBW/kg (Calculated) : 68.4  Vital Signs: Temp: 98.4 F (36.9 C) (07/06 0558) Temp Source: Oral (07/06 0558) BP: 115/54 mmHg (07/06 0558) Pulse Rate: 96 (07/06 0558) Intake/Output from previous day: 07/05 0701 - 07/06 0700 In: 702 [P.O.:702] Out: -   Labs:  Recent Labs  10/21/14 1940 10/22/14 0311  WBC 10.4 9.1  HGB 12.1* 11.7*  PLT 346 295  CREATININE 0.83 0.85   Estimated Creatinine Clearance: 119.8 mL/min (by C-G formula based on Cr of 0.85). No results for input(s): VANCOTROUGH, VANCOPEAK, VANCORANDOM, GENTTROUGH, GENTPEAK, GENTRANDOM, TOBRATROUGH, TOBRAPEAK, TOBRARND, AMIKACINPEAK, AMIKACINTROU, AMIKACIN in the last 72 hours.   Microbiology: Recent Results (from the past 720 hour(s))  Surgical pcr screen     Status: None   Collection Time: 10/22/14 12:52 AM  Result Value Ref Range Status   MRSA, PCR NEGATIVE NEGATIVE Final   Staphylococcus aureus NEGATIVE NEGATIVE Final    Comment:        The Xpert SA Assay (FDA approved for NASAL specimens in patients over 49 years of age), is one component of a comprehensive surveillance program.  Test performance has been validated by [facility] for patients greater than or equal to 49 year old. It is not intended to diagnose infection nor to guide or monitor treatment.   Culture, blood (x 2)     Status: None (Preliminary result)   Collection Time: 10/22/14  1:28 AM  Result Value Ref Range Status   Specimen Description BLOOD LEFT ANTECUBITAL  Final   Special Requests BOTTLES DRAWN AEROBIC AND ANAEROBIC 10CC  Final   Culture NO GROWTH 1 DAY  Final   Report Status PENDING  Incomplete  Culture, blood (x 2)     Status: None (Preliminary  result)   Collection Time: 10/22/14  1:38 AM  Result Value Ref Range Status   Specimen Description BLOOD LEFT HAND  Final   Special Requests BOTTLES DRAWN AEROBIC ONLY 10CC  Final   Culture NO GROWTH 1 DAY  Final   Report Status PENDING  Incomplete      Assessment: [age] YOM presented with right foot and leg pain, swelling, and redness.  Pharmacy consulted to manage vancomycin and Zosyn for cellulitis.  MRI negative for osteomyelitis.  Patient's renal function is stable and vancomycin trough is supra-therapeutic.  Vanc 7/3 >> Zosyn 7/4 >>  7/6 VT = 20 mcg/mL  7/4 BCx2 - NGTD 6/4 MRSA - negative   Goal of Therapy:  Vancomycin trough level 10-15 mcg/ml   Plan:  - Change vanc to 1gm IV Q12H - Continue Zosyn 3.375gm IV Q8H, 4 hr infusion - Monitor renal fxn, clinical progress, repeat vanc trough if continues on therapy   Jonuel Butterfield D. Laney Potashang, PharmD, BCPS Pager:  [phone] 10/24/2014, 7:40 AM
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
## 2014-10-26 ENCOUNTER — Other Ambulatory Visit: Payer: Self-pay | Admitting: Orthopedic Surgery

## 2014-10-27 LAB — CULTURE, BLOOD (ROUTINE X 2)
CULTURE: NO GROWTH
Culture: NO GROWTH

## 2014-10-29 ENCOUNTER — Encounter (HOSPITAL_BASED_OUTPATIENT_CLINIC_OR_DEPARTMENT_OTHER): Payer: Self-pay | Admitting: *Deleted

## 2014-11-01 ENCOUNTER — Encounter (HOSPITAL_BASED_OUTPATIENT_CLINIC_OR_DEPARTMENT_OTHER): Payer: Self-pay

## 2014-11-01 ENCOUNTER — Ambulatory Visit (HOSPITAL_BASED_OUTPATIENT_CLINIC_OR_DEPARTMENT_OTHER): Payer: Medicaid Other | Admitting: Certified Registered"

## 2014-11-01 ENCOUNTER — Ambulatory Visit (HOSPITAL_BASED_OUTPATIENT_CLINIC_OR_DEPARTMENT_OTHER)
Admission: RE | Admit: 2014-11-01 | Discharge: 2014-11-01 | Disposition: A | Discharge status: Home / Self Care | Payer: Medicaid Other | Source: Ambulatory Visit | Attending: Orthopedic Surgery | Admitting: Orthopedic Surgery

## 2014-11-01 ENCOUNTER — Encounter (HOSPITAL_BASED_OUTPATIENT_CLINIC_OR_DEPARTMENT_OTHER)
Admission: RE | Disposition: A | Discharge status: Home / Self Care | Payer: Self-pay | Source: Ambulatory Visit | Attending: Orthopedic Surgery

## 2014-11-01 DIAGNOSIS — Z791 Long term (current) use of non-steroidal anti-inflammatories (NSAID): Secondary | ICD-10-CM | POA: Insufficient documentation

## 2014-11-01 DIAGNOSIS — F329 Major depressive disorder, single episode, unspecified: Secondary | ICD-10-CM | POA: Insufficient documentation

## 2014-11-01 DIAGNOSIS — S92411A Displaced fracture of proximal phalanx of right great toe, initial encounter for closed fracture: Secondary | ICD-10-CM | POA: Diagnosis present

## 2014-11-01 DIAGNOSIS — S91301A Unspecified open wound, right foot, initial encounter: Secondary | ICD-10-CM | POA: Diagnosis not present

## 2014-11-01 DIAGNOSIS — S9781XA Crushing injury of right foot, initial encounter: Secondary | ICD-10-CM | POA: Insufficient documentation

## 2014-11-01 DIAGNOSIS — F17210 Nicotine dependence, cigarettes, uncomplicated: Secondary | ICD-10-CM | POA: Insufficient documentation

## 2014-11-01 HISTORY — PX: I&D EXTREMITY: SHX5045

## 2014-11-01 HISTORY — DX: Anesthesia of skin: R20.0

## 2014-11-01 HISTORY — DX: Unspecified fracture of unspecified toe(s), initial encounter for closed fracture: S92.919A

## 2014-11-01 HISTORY — PX: APPLICATION OF A-CELL OF EXTREMITY: SHX6303

## 2014-11-01 HISTORY — PX: ORIF TOE FRACTURE: SHX5032

## 2014-11-01 HISTORY — DX: Personal history of diseases of the skin and subcutaneous tissue: Z87.2

## 2014-11-01 LAB — POCT HEMOGLOBIN-HEMACUE: HEMOGLOBIN: 13.2 g/dL (ref 13.0–17.0)

## 2014-11-01 SURGERY — OPEN REDUCTION INTERNAL FIXATION (ORIF) METATARSAL (TOE) FRACTURE
Anesthesia: General | Site: Toe | Laterality: Right

## 2014-11-01 MED ORDER — OXYCODONE HCL 5 MG PO TABS: 5.0000 mg | ORAL_TABLET | Freq: Once | ORAL | Status: DC | PRN

## 2014-11-01 MED ORDER — MEPERIDINE HCL 25 MG/ML IJ SOLN: 6.2500 mg | INTRAMUSCULAR | Status: DC | PRN

## 2014-11-01 MED ORDER — GLYCOPYRROLATE 0.2 MG/ML IJ SOLN: 0.2000 mg | Freq: Once | INTRAMUSCULAR | Status: DC | PRN

## 2014-11-01 MED ORDER — HYDROMORPHONE HCL 1 MG/ML IJ SOLN
INTRAMUSCULAR | Status: AC
  Filled 2014-11-01: qty 1

## 2014-11-01 MED ORDER — MIDAZOLAM HCL 2 MG/2ML IJ SOLN
1.0000 mg | INTRAMUSCULAR | Status: DC | PRN
  Administered 2014-11-01: 2 mg via INTRAVENOUS

## 2014-11-01 MED ORDER — PROPOFOL 10 MG/ML IV BOLUS
INTRAVENOUS | Status: DC | PRN
  Administered 2014-11-01: 200 mg via INTRAVENOUS

## 2014-11-01 MED ORDER — LACTATED RINGERS IV SOLN
INTRAVENOUS | Status: DC
  Administered 2014-11-01 (×2): via INTRAVENOUS
  Administered 2014-11-01: 10 mL/h via INTRAVENOUS

## 2014-11-01 MED ORDER — 0.9 % SODIUM CHLORIDE (POUR BTL) OPTIME
TOPICAL | Status: DC | PRN
  Administered 2014-11-01: 500 mL

## 2014-11-01 MED ORDER — MIDAZOLAM HCL 2 MG/2ML IJ SOLN
INTRAMUSCULAR | Status: AC
  Filled 2014-11-01: qty 2

## 2014-11-01 MED ORDER — FENTANYL CITRATE (PF) 100 MCG/2ML IJ SOLN
INTRAMUSCULAR | Status: AC
  Filled 2014-11-01: qty 6

## 2014-11-01 MED ORDER — SCOPOLAMINE 1 MG/3DAYS TD PT72: 1.0000 | MEDICATED_PATCH | Freq: Once | TRANSDERMAL | Status: DC | PRN

## 2014-11-01 MED ORDER — OXYCODONE HCL 5 MG/5ML PO SOLN: 5.0000 mg | Freq: Once | ORAL | Status: DC | PRN

## 2014-11-01 MED ORDER — LIDOCAINE HCL (CARDIAC) 20 MG/ML IV SOLN
INTRAVENOUS | Status: DC | PRN
  Administered 2014-11-01: 60 mg via INTRAVENOUS

## 2014-11-01 MED ORDER — OXYCODONE HCL 5 MG PO TABS: 5.0000 mg | ORAL_TABLET | ORAL | Status: DC | PRN

## 2014-11-01 MED ORDER — HYDROMORPHONE HCL 1 MG/ML IJ SOLN
0.2500 mg | INTRAMUSCULAR | Status: DC | PRN
  Administered 2014-11-01 (×3): 0.5 mg via INTRAVENOUS

## 2014-11-01 MED ORDER — DEXAMETHASONE SODIUM PHOSPHATE 10 MG/ML IJ SOLN
INTRAMUSCULAR | Status: DC | PRN
  Administered 2014-11-01: 10 mg via INTRAVENOUS

## 2014-11-01 MED ORDER — CEFAZOLIN SODIUM-DEXTROSE 2-3 GM-% IV SOLR
2.0000 g | INTRAVENOUS | Status: AC
  Administered 2014-11-01: 2 g via INTRAVENOUS

## 2014-11-01 MED ORDER — SODIUM CHLORIDE 0.9 % IR SOLN
Status: DC | PRN
  Administered 2014-11-01: 3000 mL

## 2014-11-01 MED ORDER — FENTANYL CITRATE (PF) 100 MCG/2ML IJ SOLN
50.0000 ug | INTRAMUSCULAR | Status: AC | PRN
  Administered 2014-11-01: 50 ug via INTRAVENOUS
  Administered 2014-11-01: 25 ug via INTRAVENOUS
  Administered 2014-11-01: 50 ug via INTRAVENOUS
  Administered 2014-11-01: 25 ug via INTRAVENOUS
  Administered 2014-11-01: 50 ug via INTRAVENOUS

## 2014-11-01 MED ORDER — CHLORHEXIDINE GLUCONATE 4 % EX LIQD: 60.0000 mL | Freq: Once | CUTANEOUS | Status: DC

## 2014-11-01 SURGICAL SUPPLY — 104 items
BAG DECANTER FOR FLEXI CONT (MISCELLANEOUS) ×2 IMPLANT
BANDAGE ELASTIC 4 VELCRO ST LF (GAUZE/BANDAGES/DRESSINGS) ×2 IMPLANT
BANDAGE ELASTIC 6 VELCRO ST LF (GAUZE/BANDAGES/DRESSINGS) IMPLANT
BANDAGE ESMARK 6X9 LF (GAUZE/BANDAGES/DRESSINGS) IMPLANT
BANDAGE GAUZE 4  KLING STR (GAUZE/BANDAGES/DRESSINGS) IMPLANT
BLADE 10 SAFETY STRL DISP (BLADE) ×2 IMPLANT
BLADE SURG 15 STRL LF DISP TIS (BLADE) ×4 IMPLANT
BLADE SURG 15 STRL SS (BLADE) ×8
BNDG CMPR 9X4 STRL LF SNTH (GAUZE/BANDAGES/DRESSINGS) ×2
BNDG CMPR 9X6 STRL LF SNTH (GAUZE/BANDAGES/DRESSINGS) ×2
BNDG COHESIVE 3X5 TAN STRL LF (GAUZE/BANDAGES/DRESSINGS) IMPLANT
BNDG COHESIVE 4X5 TAN STRL (GAUZE/BANDAGES/DRESSINGS) ×4 IMPLANT
BNDG COHESIVE 6X5 TAN STRL LF (GAUZE/BANDAGES/DRESSINGS) IMPLANT
BNDG CONFORM 2 STRL LF (GAUZE/BANDAGES/DRESSINGS) ×2 IMPLANT
BNDG CONFORM 3 STRL LF (GAUZE/BANDAGES/DRESSINGS) ×2 IMPLANT
BNDG ESMARK 4X9 LF (GAUZE/BANDAGES/DRESSINGS) ×4 IMPLANT
BNDG ESMARK 6X9 LF (GAUZE/BANDAGES/DRESSINGS) ×4
BNDG GAUZE ELAST 4 BULKY (GAUZE/BANDAGES/DRESSINGS) ×2 IMPLANT
BNDG PLASTER X FAST 5X5 WHT LF (CAST SUPPLIES) IMPLANT
BNDG PLSTR 5X5 XFST ST WHT LF (CAST SUPPLIES)
CANISTER SUCT 1200ML W/VALVE (MISCELLANEOUS) ×4 IMPLANT
CAP PIN ORTHO PINK (CAP) ×4 IMPLANT
CAP PIN PROTECTOR ORTHO WHT (CAP) IMPLANT
CHLORAPREP W/TINT 26ML (MISCELLANEOUS) ×4 IMPLANT
COVER BACK TABLE 60X90IN (DRAPES) ×4 IMPLANT
CUFF TOURNIQUET SINGLE 24IN (TOURNIQUET CUFF) ×2 IMPLANT
CUFF TOURNIQUET SINGLE 34IN LL (TOURNIQUET CUFF) IMPLANT
DECANTER SPIKE VIAL GLASS SM (MISCELLANEOUS) IMPLANT
DRAPE EXTREMITY T 121X128X90 (DRAPE) ×4 IMPLANT
DRAPE INCISE IOBAN 66X45 STRL (DRAPES) IMPLANT
DRAPE OEC MINIVIEW 54X84 (DRAPES) ×4 IMPLANT
DRAPE SURG 17X23 STRL (DRAPES) ×4 IMPLANT
DRAPE U-SHAPE 47X51 STRL (DRAPES) ×4 IMPLANT
DRSG ADAPTIC 3X8 NADH LF (GAUZE/BANDAGES/DRESSINGS) IMPLANT
DRSG EMULSION OIL 3X3 NADH (GAUZE/BANDAGES/DRESSINGS) IMPLANT
DRSG MEPITEL 4X7.2 (GAUZE/BANDAGES/DRESSINGS) ×2 IMPLANT
DRSG PAD ABDOMINAL 8X10 ST (GAUZE/BANDAGES/DRESSINGS) ×8 IMPLANT
ELECT REM PT RETURN 9FT ADLT (ELECTROSURGICAL) ×4
ELECTRODE REM PT RTRN 9FT ADLT (ELECTROSURGICAL) ×2 IMPLANT
GAUZE SPONGE 4X4 12PLY STRL (GAUZE/BANDAGES/DRESSINGS) ×4 IMPLANT
GLOVE BIO SURGEON STRL SZ8 (GLOVE) ×4 IMPLANT
GLOVE BIOGEL PI IND STRL 7.5 (GLOVE) IMPLANT
GLOVE BIOGEL PI IND STRL 8 (GLOVE) ×4 IMPLANT
GLOVE BIOGEL PI INDICATOR 7.5 (GLOVE) ×2
GLOVE BIOGEL PI INDICATOR 8 (GLOVE) ×4
GLOVE ECLIPSE 7.5 STRL STRAW (GLOVE) ×4 IMPLANT
GLOVE EXAM NITRILE MD LF STRL (GLOVE) ×2 IMPLANT
GLOVE SURG SS PI 7.5 STRL IVOR (GLOVE) ×2 IMPLANT
GOWN STRL REUS W/ TWL LRG LVL3 (GOWN DISPOSABLE) ×2 IMPLANT
GOWN STRL REUS W/ TWL XL LVL3 (GOWN DISPOSABLE) ×4 IMPLANT
GOWN STRL REUS W/TWL LRG LVL3 (GOWN DISPOSABLE) ×4
GOWN STRL REUS W/TWL XL LVL3 (GOWN DISPOSABLE) ×8
HANDPIECE INTERPULSE COAX TIP (DISPOSABLE)
K-WIRE .054X4 (WIRE) IMPLANT
K-WIRE .062X4 (WIRE) ×8 IMPLANT
MANIFOLD NEPTUNE II (INSTRUMENTS) ×2 IMPLANT
MATRIX SURGICAL PSM 7X10CM (Tissue) ×2 IMPLANT
MICROMATRIX 500MG (Tissue) ×4 IMPLANT
NDL HYPO 25X1 1.5 SAFETY (NEEDLE) IMPLANT
NDL SAFETY ECLIPSE 18X1.5 (NEEDLE) IMPLANT
NEEDLE HYPO 18GX1.5 SHARP (NEEDLE)
NEEDLE HYPO 22GX1.5 SAFETY (NEEDLE) ×2 IMPLANT
NEEDLE HYPO 25X1 1.5 SAFETY (NEEDLE) ×4 IMPLANT
NS IRRIG 1000ML POUR BTL (IV SOLUTION) ×4 IMPLANT
PACK BASIN DAY SURGERY FS (CUSTOM PROCEDURE TRAY) ×4 IMPLANT
PAD CAST 4YDX4 CTTN HI CHSV (CAST SUPPLIES) ×4 IMPLANT
PADDING CAST ABS 4INX4YD NS (CAST SUPPLIES)
PADDING CAST ABS COTTON 4X4 ST (CAST SUPPLIES) IMPLANT
PADDING CAST COTTON 4X4 STRL (CAST SUPPLIES) ×8
PADDING CAST COTTON 6X4 STRL (CAST SUPPLIES) IMPLANT
PENCIL BUTTON HOLSTER BLD 10FT (ELECTRODE) ×4 IMPLANT
SANITIZER HAND PURELL 535ML FO (MISCELLANEOUS) ×4 IMPLANT
SET HNDPC FAN SPRY TIP SCT (DISPOSABLE) IMPLANT
SET IRRIG Y TYPE TUR BLADDER L (SET/KITS/TRAYS/PACK) ×2 IMPLANT
SHEET MEDIUM DRAPE 40X70 STRL (DRAPES) ×4 IMPLANT
SLEEVE SCD COMPRESS KNEE MED (MISCELLANEOUS) ×4 IMPLANT
SOLUTION PARTIC MCRMTRX 500MG (Tissue) IMPLANT
SPLINT FAST PLASTER 5X30 (CAST SUPPLIES)
SPLINT PLASTER CAST FAST 5X30 (CAST SUPPLIES) IMPLANT
SPONGE LAP 18X18 X RAY DECT (DISPOSABLE) ×6 IMPLANT
STAPLER VISISTAT 35W (STAPLE) IMPLANT
STOCKINETTE 6  STRL (DRAPES) ×2
STOCKINETTE 6 STRL (DRAPES) ×2 IMPLANT
SUCTION FRAZIER TIP 10 FR DISP (SUCTIONS) ×4 IMPLANT
SURGILUBE 2OZ TUBE FLIPTOP (MISCELLANEOUS) ×2 IMPLANT
SUT ETHILON 3 0 PS 1 (SUTURE) ×6 IMPLANT
SUT ETHILON 4 0 PS 2 18 (SUTURE) IMPLANT
SUT FIBERWIRE #2 38 T-5 BLUE (SUTURE)
SUT MNCRL AB 3-0 PS2 18 (SUTURE) IMPLANT
SUT SILK 2 0 FS (SUTURE) ×2 IMPLANT
SUT VIC AB 0 SH 27 (SUTURE) IMPLANT
SUT VIC AB 2-0 SH 27 (SUTURE)
SUT VIC AB 2-0 SH 27XBRD (SUTURE) IMPLANT
SUT VIC AB 3-0 PS1 18 (SUTURE)
SUT VIC AB 3-0 PS1 18XBRD (SUTURE) IMPLANT
SUT VIC AB 5-0 PS2 18 (SUTURE) ×2 IMPLANT
SUTURE FIBERWR #2 38 T-5 BLUE (SUTURE) IMPLANT
SYR 20CC LL (SYRINGE) ×2 IMPLANT
SYR BULB 3OZ (MISCELLANEOUS) ×4 IMPLANT
SYR CONTROL 10ML LL (SYRINGE) ×2 IMPLANT
TOWEL OR 17X24 6PK STRL BLUE (TOWEL DISPOSABLE) ×10 IMPLANT
TUBE CONNECTING 20'X1/4 (TUBING) ×1
TUBE CONNECTING 20X1/4 (TUBING) ×3 IMPLANT
UNDERPAD 30X30 (UNDERPADS AND DIAPERS) ×6 IMPLANT

## 2014-11-01 NOTE — Anesthesia Preprocedure Evaluation (Addendum)
Anesthesia Evaluation  Patient identified by MRN, date of birth, ID band Patient awake    Reviewed: Allergy & Precautions, NPO status , Patient's Chart, lab work & pertinent test results  Airway Mallampati: I  TM Distance: >3 FB Neck ROM: Full    Dental  (+) Teeth Intact, Dental Advisory Given   Pulmonary Current Smoker,  breath sounds clear to auscultation        Cardiovascular Rhythm:Regular Rate:Normal     Neuro/Psych PSYCHIATRIC DISORDERS Depression    GI/Hepatic   Endo/Other    Renal/GU      Musculoskeletal   Abdominal   Peds  Hematology   Anesthesia Other Findings   Reproductive/Obstetrics                            Anesthesia Physical Anesthesia Plan  ASA: II  Anesthesia Plan: General   Post-op Pain Management:    Induction: Intravenous  Airway Management Planned: LMA  Additional Equipment:   Intra-op Plan:   Post-operative Plan: Extubation in OR  Informed Consent: I have reviewed the patients History and Physical, chart, labs and discussed the procedure including the risks, benefits and alternatives for the proposed anesthesia with the patient or authorized representative who has indicated his/her understanding and acceptance.   Dental advisory given  Plan Discussed with: CRNA, Anesthesiologist and Surgeon  Anesthesia Plan Comments:         Anesthesia Quick Evaluation

## 2014-11-01 NOTE — Brief Op Note (Signed)
11/01/2014  2:48 PM  PATIENT:  [name] [name]  49 y.o. male  PRE-OPERATIVE DIAGNOSIS:  RIGHT HALLUX FRACTURE AND RIGHT FOOT WOUNDS   POST-OPERATIVE DIAGNOSIS: 1.  Right hallux fracture      2.  Right dorsal foot crush injury with full thickness skin loss 5 cm x 7 cm      3.  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  Procedure(s): 1.  OPEN REDUCTION INTERNAL FIXATION (ORIF) OF RIGHT HALLUX FRACTURE  2.  IRRIGATION AND DEBRIDEMENT RIGHT FOOT WOUNDS 3.  Amputation of right 4th toe tuft 4.  Amputation of right 5th toe distal phalanx 5.  APPLICATION OF A-CELL TO RIGHT DORSAL FOOT wound  SURGEON:  Toni ArthursJohn Aristotle Lieb, MD  ASSISTANT: n/a  ANESTHESIA:   General  EBL:  10 cc  TOURNIQUET:   Total Tourniquet Time Documented: Calf (Right) - 42 minutes Total: Calf (Right) - 42 minutes  COMPLICATIONS:  None apparent  DISPOSITION:  Extubated, awake and stable to recovery.  DICTATION ID:  [id number]

## 2014-11-01 NOTE — Discharge Instructions (Signed)
Toni ArthursJohn Hewitt, MD [facility]  Please read the following information regarding your care after surgery.  Medications  You only need a prescription for the narcotic pain medicine (ex. oxycodone, Percocet, Norco).  All of the other medicines listed below are available over the counter. X acetominophen (Tylenol) 650 mg every 4-6 hours as you need for minor pain X oxycodone as prescribed for moderate to severe pain  Narcotic pain medicine (ex. oxycodone, Percocet, Vicodin) will cause constipation.  To prevent this problem, take the following medicines while you are taking any pain medicine. X docusate sodium (Colace) 100 mg twice a day X senna (Senokot) 2 tablets twice a day  Weight Bearing ? Bear weight when you are able on your operated leg or foot. X Bear weight only on the heel of your operated foot in the post-op shoe. ? Do not bear any weight on the operated leg or foot.  Cast / Splint / Dressing Starting Saturday, change your dressing daily with K-Y jelly on the silicone dressing followed by 4x4 gauze dressings and Kerlix.  Wrap the ace bandage around the Kerlix.  Do not get the wound wet.  Swelling It is normal for you to have swelling where you had surgery.  To reduce swelling and pain, keep your toes above your nose for at least 3 days after surgery.  It may be necessary to keep your foot or leg elevated for several weeks.  If it hurts, it should be elevated.  Follow Up Call my office at [phone] when you are discharged from the hospital or surgery center to schedule an appointment to be seen two weeks after surgery.  Call my office at [phone] if you develop a fever >101.5 F, nausea, vomiting, bleeding from the surgical site or severe pain.      Post Anesthesia Home Care Instructions  Activity: Get plenty of rest for the remainder of the day. A responsible adult should stay with you for 24 hours following the procedure.  For the next 24 hours, DO  NOT: -Drive a car -Advertising copywriterperate machinery -Drink alcoholic beverages -Take any medication unless instructed by your physician -Make any legal decisions or sign important papers.  Meals: Start with liquid foods such as gelatin or soup. Progress to regular foods as tolerated. Avoid greasy, spicy, heavy foods. If nausea and/or vomiting occur, drink only clear liquids until the nausea and/or vomiting subsides. Call your physician if vomiting continues.  Special Instructions/Symptoms: Your throat may feel dry or sore from the anesthesia or the breathing tube placed in your throat during surgery. If this causes discomfort, gargle with warm salt water. The discomfort should disappear within 24 hours.  If you had a scopolamine patch placed behind your ear for the management of post- operative nausea and/or vomiting:  1. The medication in the patch is effective for 72 hours, after which it should be removed.  Wrap patch in a tissue and discard in the trash. Wash hands thoroughly with soap and water. 2. You may remove the patch earlier than 72 hours if you experience unpleasant side effects which may include dry mouth, dizziness or visual disturbances. 3. Avoid touching the patch. Wash your hands with soap and water after contact with the patch.

## 2014-11-01 NOTE — Anesthesia Postprocedure Evaluation (Signed)
  Anesthesia Post-op Note  Patient: [name] [name]  Procedure(s) Performed: Procedure(s): OPEN REDUCTION INTERNAL FIXATION (ORIF) OF RIGHT HALLUX FRACTURE  (Right) IRRIGATION AND DEBRIDEMENT RIGHT FOOT WOUNDS (Right) APPLICATION OF A-CELL TO RIGHT DORSAL FOOT (Right)  Patient Location: PACU  Anesthesia Type: General   Level of Consciousness: awake, alert  and oriented  Airway and Oxygen Therapy: Patient Spontanous Breathing  Post-op Pain: mild  Post-op Assessment: Post-op Vital signs reviewed  Post-op Vital Signs: Reviewed  Last Vitals:  Filed Vitals:   11/01/14 1558  BP: 143/59  Pulse: 82  Temp: 36.9 C  Resp: 18    Complications: No apparent anesthesia complications

## 2014-11-01 NOTE — Transfer of Care (Signed)
Immediate Anesthesia Transfer of Care Note  Patient: Jenkins RougeStephen Loya  Procedure(s) Performed: Procedure(s): OPEN REDUCTION INTERNAL FIXATION (ORIF) OF RIGHT HALLUX FRACTURE  (Right) IRRIGATION AND DEBRIDEMENT RIGHT FOOT WOUNDS (Right) APPLICATION OF A-CELL TO RIGHT DORSAL FOOT (Right)  Patient Location: PACU  Anesthesia Type:General  Level of Consciousness: sedated  Airway & Oxygen Therapy: Patient Spontanous Breathing and Patient connected to face mask oxygen  Post-op Assessment: Report given to RN and Post -op Vital signs reviewed and stable  Post vital signs: Reviewed and stable  Last Vitals:  Filed Vitals:   11/01/14 1113  BP: 126/63  Pulse: 74  Temp: 36.8 C  Resp: 20    Complications: No apparent anesthesia complications

## 2014-11-01 NOTE — Anesthesia Procedure Notes (Signed)
Procedure Name: LMA Insertion Date/Time: 11/01/2014 1:04 PM Performed by: Jerris Keltz D Pre-anesthesia Checklist: Patient identified, Emergency Drugs available, Suction available and Patient being monitored Patient Re-evaluated:Patient Re-evaluated prior to inductionOxygen Delivery Method: Circle System Utilized Preoxygenation: Pre-oxygenation with 100% oxygen Intubation Type: IV induction Ventilation: Mask ventilation without difficulty LMA: LMA inserted LMA Size: 5.0 Number of attempts: 1 Airway Equipment and Method: Bite block Placement Confirmation: positive ETCO2 Tube secured with: Tape Dental Injury: Teeth and Oropharynx as per pre-operative assessment
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
## 2014-11-02 ENCOUNTER — Encounter (HOSPITAL_BASED_OUTPATIENT_CLINIC_OR_DEPARTMENT_OTHER): Payer: Self-pay | Admitting: Orthopedic Surgery
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
## 2014-11-07 ENCOUNTER — Emergency Department (HOSPITAL_COMMUNITY): Payer: Medicaid Other

## 2014-11-07 ENCOUNTER — Inpatient Hospital Stay (HOSPITAL_COMMUNITY)
Admission: EM | Admit: 2014-11-07 | Discharge: 2014-11-16 | DRG: 575 | Disposition: A | Discharge status: Home Health | Payer: Medicaid Other | Attending: Family Medicine | Admitting: Family Medicine

## 2014-11-07 ENCOUNTER — Encounter (HOSPITAL_BASED_OUTPATIENT_CLINIC_OR_DEPARTMENT_OTHER): Payer: Medicaid Other | Attending: Surgery

## 2014-11-07 ENCOUNTER — Encounter (HOSPITAL_COMMUNITY): Payer: Self-pay

## 2014-11-07 DIAGNOSIS — T17908A Unspecified foreign body in respiratory tract, part unspecified causing other injury, initial encounter: Secondary | ICD-10-CM

## 2014-11-07 DIAGNOSIS — F329 Major depressive disorder, single episode, unspecified: Secondary | ICD-10-CM | POA: Diagnosis present

## 2014-11-07 DIAGNOSIS — L97509 Non-pressure chronic ulcer of other part of unspecified foot with unspecified severity: Secondary | ICD-10-CM | POA: Insufficient documentation

## 2014-11-07 DIAGNOSIS — Z885 Allergy status to narcotic agent status: Secondary | ICD-10-CM | POA: Diagnosis not present

## 2014-11-07 DIAGNOSIS — S9781XD Crushing injury of right foot, subsequent encounter: Secondary | ICD-10-CM | POA: Diagnosis not present

## 2014-11-07 DIAGNOSIS — L03115 Cellulitis of right lower limb: Principal | ICD-10-CM | POA: Diagnosis present

## 2014-11-07 DIAGNOSIS — B965 Pseudomonas (aeruginosa) (mallei) (pseudomallei) as the cause of diseases classified elsewhere: Secondary | ICD-10-CM | POA: Diagnosis present

## 2014-11-07 DIAGNOSIS — Z9841 Cataract extraction status, right eye: Secondary | ICD-10-CM | POA: Diagnosis not present

## 2014-11-07 DIAGNOSIS — Z79899 Other long term (current) drug therapy: Secondary | ICD-10-CM | POA: Diagnosis not present

## 2014-11-07 DIAGNOSIS — Z89421 Acquired absence of other right toe(s): Secondary | ICD-10-CM

## 2014-11-07 DIAGNOSIS — Z961 Presence of intraocular lens: Secondary | ICD-10-CM | POA: Diagnosis present

## 2014-11-07 DIAGNOSIS — F17210 Nicotine dependence, cigarettes, uncomplicated: Secondary | ICD-10-CM | POA: Insufficient documentation

## 2014-11-07 DIAGNOSIS — M869 Osteomyelitis, unspecified: Secondary | ICD-10-CM | POA: Diagnosis not present

## 2014-11-07 DIAGNOSIS — L0390 Cellulitis, unspecified: Secondary | ICD-10-CM | POA: Diagnosis present

## 2014-11-07 DIAGNOSIS — Z791 Long term (current) use of non-steroidal anti-inflammatories (NSAID): Secondary | ICD-10-CM | POA: Diagnosis not present

## 2014-11-07 DIAGNOSIS — Z720 Tobacco use: Secondary | ICD-10-CM

## 2014-11-07 DIAGNOSIS — R1110 Vomiting, unspecified: Secondary | ICD-10-CM | POA: Diagnosis not present

## 2014-11-07 DIAGNOSIS — Z9889 Other specified postprocedural states: Secondary | ICD-10-CM | POA: Insufficient documentation

## 2014-11-07 DIAGNOSIS — Z1623 Resistance to quinolones and fluoroquinolones: Secondary | ICD-10-CM | POA: Diagnosis present

## 2014-11-07 DIAGNOSIS — S9781XA Crushing injury of right foot, initial encounter: Secondary | ICD-10-CM | POA: Diagnosis present

## 2014-11-07 DIAGNOSIS — L97513 Non-pressure chronic ulcer of other part of right foot with necrosis of muscle: Secondary | ICD-10-CM

## 2014-11-07 LAB — CBC WITH DIFFERENTIAL/PLATELET
BASOS ABS: 0 10*3/uL (ref 0.0–0.1)
BASOS PCT: 0 % (ref 0–1)
EOS ABS: 1.1 10*3/uL — AB (ref 0.0–0.7)
Eosinophils Relative: 13 % — ABNORMAL HIGH (ref 0–5)
HCT: 36.9 % — ABNORMAL LOW (ref 39.0–52.0)
Hemoglobin: 12.3 g/dL — ABNORMAL LOW (ref 13.0–17.0)
Lymphocytes Relative: 23 % (ref 12–46)
Lymphs Abs: 2 10*3/uL (ref 0.7–4.0)
MCH: 30.3 pg (ref 26.0–34.0)
MCHC: 33.3 g/dL (ref 30.0–36.0)
MCV: 90.9 fL (ref 78.0–100.0)
MONOS PCT: 5 % (ref 3–12)
Monocytes Absolute: 0.4 10*3/uL (ref 0.1–1.0)
Neutro Abs: 4.9 10*3/uL (ref 1.7–7.7)
Neutrophils Relative %: 59 % (ref 43–77)
Platelets: 339 10*3/uL (ref 150–400)
RBC: 4.06 MIL/uL — ABNORMAL LOW (ref 4.22–5.81)
RDW: 13.4 % (ref 11.5–15.5)
WBC: 8.4 10*3/uL (ref 4.0–10.5)

## 2014-11-07 LAB — COMPREHENSIVE METABOLIC PANEL
ALT: 17 U/L (ref 17–63)
AST: 18 U/L (ref 15–41)
Albumin: 3.3 g/dL — ABNORMAL LOW (ref 3.5–5.0)
Alkaline Phosphatase: 61 U/L (ref 38–126)
Anion gap: 10 (ref 5–15)
BUN: 16 mg/dL (ref 6–20)
CHLORIDE: 101 mmol/L (ref 101–111)
CO2: 24 mmol/L (ref 22–32)
Calcium: 8.9 mg/dL (ref 8.9–10.3)
Creatinine, Ser: 1.06 mg/dL (ref 0.61–1.24)
GFR calc non Af Amer: >60 mL/min (ref >60)
GLUCOSE: 164 mg/dL — AB (ref 65–99)
Potassium: 4.1 mmol/L (ref 3.5–5.1)
Sodium: 135 mmol/L (ref 135–145)
Total Bilirubin: 0.2 mg/dL — ABNORMAL LOW (ref 0.3–1.2)
Total Protein: 7.1 g/dL (ref 6.5–8.1)

## 2014-11-07 LAB — SEDIMENTATION RATE: Sed Rate: 53 mm/hr — ABNORMAL HIGH (ref 0–16)

## 2014-11-07 MED ORDER — SODIUM CHLORIDE 0.9 % IV SOLN: 250.0000 mL | INTRAVENOUS | Status: DC | PRN

## 2014-11-07 MED ORDER — OXYCODONE-ACETAMINOPHEN 5-325 MG PO TABS
2.0000 | ORAL_TABLET | Freq: Once | ORAL | Status: AC
  Administered 2014-11-07: 2 via ORAL
  Filled 2014-11-07: qty 2

## 2014-11-07 MED ORDER — VANCOMYCIN HCL IN DEXTROSE 1-5 GM/200ML-% IV SOLN
1000.0000 mg | Freq: Three times a day (TID) | INTRAVENOUS | Status: DC
  Administered 2014-11-08 – 2014-11-15 (×22): 1000 mg via INTRAVENOUS
  Filled 2014-11-07 (×28): qty 200

## 2014-11-07 MED ORDER — ACETAMINOPHEN 650 MG RE SUPP: 650.0000 mg | Freq: Four times a day (QID) | RECTAL | Status: DC | PRN

## 2014-11-07 MED ORDER — HYDROCODONE-ACETAMINOPHEN 5-325 MG PO TABS
1.0000 | ORAL_TABLET | Freq: Four times a day (QID) | ORAL | Status: DC | PRN
  Administered 2014-11-08 (×2): 1 via ORAL
  Filled 2014-11-07 (×2): qty 1

## 2014-11-07 MED ORDER — PIPERACILLIN-TAZOBACTAM 3.375 G IVPB
3.3750 g | Freq: Three times a day (TID) | INTRAVENOUS | Status: DC
  Administered 2014-11-08 – 2014-11-16 (×24): 3.375 g via INTRAVENOUS
  Filled 2014-11-07 (×29): qty 50

## 2014-11-07 MED ORDER — VANCOMYCIN HCL IN DEXTROSE 1-5 GM/200ML-% IV SOLN: 1000.0000 mg | Freq: Once | INTRAVENOUS | Status: DC

## 2014-11-07 MED ORDER — SODIUM CHLORIDE 0.9 % IJ SOLN
3.0000 mL | Freq: Two times a day (BID) | INTRAMUSCULAR | Status: DC
  Administered 2014-11-09 – 2014-11-15 (×9): 3 mL via INTRAVENOUS

## 2014-11-07 MED ORDER — POLYETHYLENE GLYCOL 3350 17 G PO PACK: 17.0000 g | PACK | Freq: Every day | ORAL | Status: DC | PRN

## 2014-11-07 MED ORDER — PIPERACILLIN-TAZOBACTAM 3.375 G IVPB 30 MIN
3.3750 g | Freq: Once | INTRAVENOUS | Status: AC
  Administered 2014-11-07: 3.375 g via INTRAVENOUS
  Filled 2014-11-07: qty 50

## 2014-11-07 MED ORDER — CITALOPRAM HYDROBROMIDE 10 MG PO TABS
10.0000 mg | ORAL_TABLET | Freq: Every day | ORAL | Status: DC
  Administered 2014-11-08 – 2014-11-16 (×9): 10 mg via ORAL
  Filled 2014-11-07 (×10): qty 1

## 2014-11-07 MED ORDER — ACETAMINOPHEN 325 MG PO TABS
650.0000 mg | ORAL_TABLET | Freq: Four times a day (QID) | ORAL | Status: DC | PRN
  Administered 2014-11-09 – 2014-11-12 (×5): 650 mg via ORAL
  Filled 2014-11-07 (×5): qty 2

## 2014-11-07 MED ORDER — SODIUM CHLORIDE 0.9 % IJ SOLN: 3.0000 mL | INTRAMUSCULAR | Status: DC | PRN

## 2014-11-07 MED ORDER — HEPARIN SODIUM (PORCINE) 5000 UNIT/ML IJ SOLN
5000.0000 [IU] | Freq: Three times a day (TID) | INTRAMUSCULAR | Status: DC
  Administered 2014-11-08 – 2014-11-14 (×19): 5000 [IU] via SUBCUTANEOUS
  Filled 2014-11-07 (×20): qty 1
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
## 2014-11-07 NOTE — ED Notes (Signed)
Attempted report 
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
## 2014-11-08 MED ORDER — MORPHINE SULFATE 2 MG/ML IJ SOLN
2.0000 mg | INTRAMUSCULAR | Status: DC | PRN
  Administered 2014-11-08 – 2014-11-13 (×15): 2 mg via INTRAVENOUS
  Filled 2014-11-08 (×18): qty 1
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
## 2014-11-08 NOTE — Progress Notes (Addendum)
Family Medicine Teaching Service Daily Progress Note Intern Pager: [phone]  Patient name: [name] Medical record number: [id number] Date of birth: 06/03/1965 Age: 49 y.o. Gender: male  Primary Care Provider: No PCP Per Patient Consultants: ortho Code Status: full  Pt Overview and Major Events to Date:  7/20: patient admitted with RLE swelling. Started on vanc and zosyn  Assessment and Plan:  [name] is a 49 y.o. male presenting with right foot cellulitis . PMH is significant for depression and smoking  RLE cellulitis: improved. Concerning for osteomyelitis. S/p three days of vanc and zosyn and two weeks of Bactrim. Circular clean appearing wound about 2 inch in diameter over the dorsum right foot on admission. Swelling and erythema to knee level on admission and improved this morning. Still warm to touch. No tenderness to palpation, reports burning pain that is not controlled on vicodin. Some diminished sensation over medial aspect of right leg that has resolved. Good range of motion at knee and ankle. No s&s of systemic infection. CMP and CBC WNL. Sed R of 53. Foot X-ray positive for increased osteolysis involving second through fifth metatarsal heads, couldn't exclude osteomyelitis. Less likely to be DVT given wells score of 1. Paraesthesia and swelling concerning for compartment syndrome but no tenderness, weakness or pain with dorsi- and plantar flexion at ankle. His swelling and paraesthesia improved this morning. Ortho (Dr. Victorino Dike) saw him this morning and discussed with him about a plan for possible further debridement under nerve block. - Admit to med-surge, Dr. Randolm Idol attending  - Ortho on board - Vanc and zosyn - Morphine 2 mg q3h prn severe pain. - Vicodin 5/325 mg q6h prn moderate to severe pain - Tylenol 650 mg q6h prn mild pain  Depression: on celexa 10 mg at home - continue home celexa  Smoking: 25PY - stress quit smoking - Nicotine patch if  requested  FEN/GI: Miralax daily PRN, protonix Prophylaxis: heparin SubQ  FEN/GI: protonix  PPx: hepain Subq  Disposition: floor.  Subjective:  Reports burning pain. Reports his numbness has resolved, swelling and skin redness has improved. Reports that Dr. Victorino Dike saw him this morning and discussed about possible debridement with nerve block tomorrow.   Objective: Temp:  [98.4 F (36.9 C)-98.7 F (37.1 C)] 98.4 F (36.9 C) (07/20 2330) Pulse Rate:  [60-97] 62 (07/20 2330) Resp:  [18] 18 (07/20 2330) BP: (103-143)/(57-79) 131/79 mmHg (07/20 2330) SpO2:  [92 %-100 %] 100 % (07/20 2330) Weight:  [205 lb 8 oz (93.214 kg)] 205 lb 8 oz (93.214 kg) (07/20 1945) Physical Exam: Gen: No acute distress. Alert, cooperative with exam CV: RRR. No murmurs Resp: CTAB. No wheezing, crackles, or rhonchi noted. Abd: +BS. Soft, non-distended, non-tender. No rebound or guarding.  RLE: foot dressed this morning. Swelling and erythema improved. No tenderness to palpation,  sensation intact. good range of motion at knee and ankle, skin warmer to touch. Pins in place in his right great toe . Neuro: Alert and oriented, No gross focal deficits Laboratory:  Recent Labs Lab 11/01/14 1138 11/07/14 2006  WBC  --  8.4  HGB 13.2 12.3*  HCT  --  36.9*  PLT  --  339    Recent Labs Lab 11/07/14 2006  NA 135  K 4.1  CL 101  CO2 24  BUN 16  CREATININE 1.06  CALCIUM 8.9  PROT 7.1  BILITOT 0.2*  ALKPHOS 61  ALT 17  AST 18  GLUCOSE 164*    Imaging/Diagnostic Tests: Dg Foot  Complete Right  11/07/2014   IMPRESSION: Internal fixation of comminuted fracture of proximal phalanx of great toe. Stable appearance of fractures of the phalanges of the second through fifth toes.  Increased osteolysis involving second through fifth metatarsal heads ; osteomyelitis cannot be excluded.   Electronically Signed   By: Myles Rosenthal M.D.   On: 11/07/2014 20:44   The above note was prepared by: Almon Hercules,  MD 11/08/2014, 9:30 AM PGY-1, [facility] Family Medicine FPTS Intern pager: [phone], text pages welcome

## 2014-11-08 NOTE — Progress Notes (Signed)
Utilization review completed.  

## 2014-11-08 NOTE — Consult Note (Signed)
Attempted to see patient but he was not in room.  Nurse said he had left the unit.  Will try again.

## 2014-11-09 ENCOUNTER — Encounter (HOSPITAL_COMMUNITY): Payer: Self-pay | Admitting: Physician Assistant

## 2014-11-09 ENCOUNTER — Inpatient Hospital Stay (HOSPITAL_COMMUNITY): Payer: Medicaid Other

## 2014-11-09 MED ORDER — PANTOPRAZOLE SODIUM 40 MG PO TBEC
40.0000 mg | DELAYED_RELEASE_TABLET | Freq: Every day | ORAL | Status: DC
  Administered 2014-11-09 – 2014-11-16 (×8): 40 mg via ORAL
  Filled 2014-11-09 (×8): qty 1

## 2014-11-09 MED ORDER — RENA-VITE PO TABS
1.0000 | ORAL_TABLET | Freq: Every day | ORAL | Status: DC
  Administered 2014-11-09 – 2014-11-15 (×7): 1 via ORAL
  Filled 2014-11-09 (×7): qty 1

## 2014-11-09 MED ORDER — VITAMIN C 500 MG PO TABS
500.0000 mg | ORAL_TABLET | Freq: Two times a day (BID) | ORAL | Status: DC
  Administered 2014-11-09 – 2014-11-16 (×15): 500 mg via ORAL
  Filled 2014-11-09 (×15): qty 1

## 2014-11-09 MED ORDER — IBUPROFEN 200 MG PO TABS
800.0000 mg | ORAL_TABLET | Freq: Three times a day (TID) | ORAL | Status: DC
  Administered 2014-11-09 – 2014-11-16 (×21): 800 mg via ORAL
  Filled 2014-11-09 (×23): qty 4

## 2014-11-09 MED ORDER — ENSURE ENLIVE PO LIQD
237.0000 mL | Freq: Every day | ORAL | Status: DC
  Administered 2014-11-09 – 2014-11-13 (×4): 237 mL via ORAL

## 2014-11-09 MED ORDER — GADOBENATE DIMEGLUMINE 529 MG/ML IV SOLN
20.0000 mL | Freq: Once | INTRAVENOUS | Status: AC | PRN
  Administered 2014-11-09: 20 mL via INTRAVENOUS

## 2014-11-09 NOTE — Consult Note (Signed)
Reason for Consult: soft tissue deficit to right foot after crush injury Referring Physician:Dr. Desmin [name] is an 49 y.o. male.  HPI: [name] [name] is a 49 yo male who sustained a crush injury to the right foot on 09/25/14. He was found to have 2nd and 5th toe fractures and was going to undergo surgery, but was arrested. He was readmitted to the hospital 10/21/14-10/24/14 with cellulitis of the foot and an MRI during that admission showed fractures of the 1st through 5th toes and hematoma. He underwent ORIF of the right hallux fracture, amputation of the 4th toe tuft, right 5th toe amputation and I&D of the wounds with Acell placement on 11/01/14. He was seen in the [facility] and readmitted due to worsening cellulitis and edema. He is currently on IV Zosyn and Vancomycin. We are asked to see to assist with wound management.  We are planning to take the patient to the OR on Monday afternoon for repeat I&D, surgical prep and placement of Acell and possibly the VAC.   Past Medical History  Diagnosis Date  . Depression   . Fracture of multiple toes 09/25/2014    right foot  . History of cellulitis 10/21/2014    right foot  . Numbness in right leg 09/25/2014    from knee down, per pt.    Past Surgical History  Procedure Laterality Date  . Cataract extraction w/ intraocular lens implant Right   . Pyloromyotomy      age 1 weeks  . Hand surgery Left 2000    dog bite  . Orif toe fracture Right 11/01/2014    Procedure: OPEN REDUCTION INTERNAL FIXATION (ORIF) OF RIGHT HALLUX FRACTURE ;  Surgeon: [name] Simmer, MD;  Location: [facility];  Service: Orthopedics;  Laterality: Right;  . I&d extremity Right 11/01/2014    Procedure: IRRIGATION AND DEBRIDEMENT RIGHT FOOT WOUNDS;  Surgeon: [name] Simmer, MD;  Location: [facility];  Service: Orthopedics;  Laterality: Right;  . Application of a-cell of extremity Right 11/01/2014    Procedure: APPLICATION OF A-CELL TO RIGHT  DORSAL FOOT;  Surgeon: [name] Simmer, MD;  Location: [facility];  Service: Orthopedics;  Laterality: Right;    Family History  Problem Relation Age of Onset  . Stroke Father   . Heart attack Father   . Bipolar disorder Brother   . Heart attack Brother     Social History:  reports that he has been smoking Cigarettes.  He has a 30 pack-year smoking history. He has never used smokeless tobacco. He reports that he drinks alcohol. He reports that he does not use illicit drugs.  Allergies:  Allergies  Allergen Reactions  . Hydrocodone-Acetaminophen Itching    Medications: I have reviewed the patient's current medications.  Results for orders placed or performed during the hospital encounter of 11/07/14 (from the past 48 hour(s))  Sedimentation rate     Status: Abnormal   Collection Time: 11/07/14  7:50 PM  Result Value Ref Range   Sed Rate 53 (H) 0 - 16 mm/hr  CBC with Differential     Status: Abnormal   Collection Time: 11/07/14  8:06 PM  Result Value Ref Range   WBC 8.4 4.0 - 10.5 K/uL   RBC 4.06 (L) 4.22 - 5.81 MIL/uL   Hemoglobin 12.3 (L) 13.0 - 17.0 g/dL   HCT 36.9 (L) 39.0 - 52.0 %   MCV 90.9 78.0 - 100.0 fL   MCH 30.3 26.0 -  34.0 pg   MCHC 33.3 30.0 - 36.0 g/dL   RDW 13.4 11.5 - 15.5 %   Platelets 339 150 - 400 K/uL   Neutrophils Relative % 59 43 - 77 %   Neutro Abs 4.9 1.7 - 7.7 K/uL   Lymphocytes Relative 23 12 - 46 %   Lymphs Abs 2.0 0.7 - 4.0 K/uL   Monocytes Relative 5 3 - 12 %   Monocytes Absolute 0.4 0.1 - 1.0 K/uL   Eosinophils Relative 13 (H) 0 - 5 %   Eosinophils Absolute 1.1 (H) 0.0 - 0.7 K/uL   Basophils Relative 0 0 - 1 %   Basophils Absolute 0.0 0.0 - 0.1 K/uL  Comprehensive metabolic panel     Status: Abnormal   Collection Time: 11/07/14  8:06 PM  Result Value Ref Range   Sodium 135 135 - 145 mmol/L   Potassium 4.1 3.5 - 5.1 mmol/L   Chloride 101 101 - 111 mmol/L   CO2 24 22 - 32 mmol/L   Glucose, Bld 164 (H) 65 - 99 mg/dL   BUN 16  6 - 20 mg/dL   Creatinine, Ser 1.06 0.61 - 1.24 mg/dL   Calcium 8.9 8.9 - 10.3 mg/dL   Total Protein 7.1 6.5 - 8.1 g/dL   Albumin 3.3 (L) 3.5 - 5.0 g/dL   AST 18 15 - 41 U/L   ALT 17 17 - 63 U/L   Alkaline Phosphatase 61 38 - 126 U/L   Total Bilirubin 0.2 (L) 0.3 - 1.2 mg/dL   GFR calc non Af Amer >60 >60 mL/min   GFR calc Af Amer >60 >60 mL/min    Comment: (NOTE) The eGFR has been calculated using the CKD EPI equation. This calculation has not been validated in all clinical situations. eGFR's persistently <60 mL/min signify possible Chronic Kidney Disease.    Anion gap 10 5 - 15    Dg Foot Complete Right  11/07/2014   CLINICAL DATA:  Right foot cellulitis. Recent right foot surgery for great toe fracture.  EXAM: RIGHT FOOT COMPLETE - 3+ VIEW  COMPARISON:  10/21/2014  FINDINGS: K-wires are now seen transfixing a comminuted fracture of the proximal phalanx of the great toe. No fractures of the phalanges of the second through fifth toes are again demonstrated.  Increased osteolysis is seen involving the second through fifth metatarsal heads, and osteomyelitis cannot be excluded. Plantar calcaneal bone spur again noted.  IMPRESSION: Internal fixation of comminuted fracture of proximal phalanx of great toe. Stable appearance of fractures of the phalanges of the second through fifth toes.  Increased osteolysis involving second through fifth metatarsal heads ; osteomyelitis cannot be excluded.   Electronically Signed   By: [name] [name] M.D.   On: 11/07/2014 20:44    Review of Systems  Constitutional: Positive for fever (low grade).  HENT: Negative.   Eyes: Negative.   Respiratory:       Continues to smoke cigarettes daily  Cardiovascular: Negative.   Genitourinary: Negative.   Skin: Negative.   Neurological:       Reports numbness of right lower leg and foot following injury  Psychiatric/Behavioral: Positive for depression.   Blood pressure 119/53, pulse 82, temperature 98 F (36.7 C),  temperature source Oral, resp. rate 18, height _0  (1.727 m), weight 93.214 kg (205 lb 8 oz), SpO2 96 %. Physical Exam  Constitutional: He is oriented to person, place, and time. He appears well-developed and well-nourished. No distress.  Sitting up in bed  eating breakfast  HENT:  Head: Normocephalic and atraumatic.  Nose: Nose normal.  Eyes: Conjunctivae and EOM are normal. Pupils are equal, round, and reactive to light.  Neck: Normal range of motion. Neck supple. No tracheal deviation present. No thyromegaly present.  Cardiovascular: Normal rate, regular rhythm and normal heart sounds.   Respiratory: Effort normal. No stridor. No respiratory distress.  GI: Soft. He exhibits no distension.  Musculoskeletal:  Right lower leg with pitting edema, erythema from knee through foot. Right foot dressings not removed. Pin intact over right great toe.   Neurological: He is alert and oriented to person, place, and time.    Assessment/Plan: Crush injury with multiple fractures of right toes with open wound of dorsal foot-We are planning to take the patient to the OR on Monday afternoon for repeat I&D, surgical prep and placement of Acell and possibly the VAC. Continue IV antibiotics, add protein supplement and vitamins.  Plan OR for Monday 11/12/14.   Maebelle Sulton,PA-C Plastic Surgery [phone]

## 2014-11-09 NOTE — Discharge Summary (Signed)
Family Medicine Teaching [facility] Discharge Summary  Patient name: [name] [name] Medical record number: [id number] Date of birth: 03-May-1965 Age: 49 y.o. Gender: male Date of Admission: 11/07/2014  Date of Discharge:11/16/2014 Admitting Physician: Uvaldo Rising, MD  Primary Care Provider: No PCP Per Patient Consultants: ortho, plastics  Indication for Hospitalization: Cellulitis  Discharge Diagnoses/Problem List:  Patient Active Problem List   Diagnosis Date Noted  . Cellulitis of right foot 11/07/2014  . Depression   . Tobacco abuse   . Fracture of multiple toes 09/26/2014   Disposition: home  Discharge Condition: stable  Discharge Exam: see the progress note from the date of discharge  Brief Hospital Course:  [name] [name] is a 48 y.o. male presenting with right foot cellulitis . PMH is significant for depression and smoking  RLE cellulitis: improved. S/p three days of vanc and zosyn and two weeks of Bactrim in the past. Circular clean appearing wound about 2 inch in diameter over the dorsum right foot on admission. Swelling and erythema to knee level and paraesthesia over medial aspect of right leg on admission . Good range of motion at knee and ankle. No s&s of systemic infection. CMP and CBC WNL. Sed R of 53. Foot X-ray positive for increased osteolysis involving second through fifth metatarsal heads, couldn't exclude osteomyelitis. Less likely to be DVT given wells score of 1. Started vanc and zosyn (7/20-7/28). Swelling, erythema and pain improved over the course of hospitalization. MRI of the right foot negative for osteo. Irrigation and debridement on 11/12/2014. Deep wound culture grew Pseudomonas. ID consulted and recommended continuing Zosyn until 12/06/2014. PICC line placed on 11/16/2014. Patient discharged with home wound vac, paper prescription for zosyn and HH for IV medicine at home.  Follow up apts set with plastic surgery & PCP. He will call set up a follow up  with orthopedics.  Smoking: 25PY. Discussed about the impact on his wound healing multiple times. He declined nicotine patch or meds saying that he can quit on his own.  Issues for Follow Up:  1. Wound vac change 2. Orthopedics in three weeks 3. Smoking: discussed about the impact this has on his wound healing multiple times. Declined patches or pills saying that he quit cold [place] in the past. Recommend stressing on this again at follow up.   Significant Procedures: irrigation, debridement, A-cell placement and wound vac  Significant Labs and Imaging:   Recent Labs Lab 11/13/14 0544 11/16/14 0422  WBC 8.8 11.1*  HGB 11.5* 11.2*  HCT 35.2* 33.2*  PLT 346 322    Recent Labs Lab 11/13/14 0544 11/16/14 0422  NA 137 138  K 4.1 4.2  CL 103 104  CO2 25 27  GLUCOSE 115* 109*  BUN 11 16  CREATININE 0.82 1.06  CALCIUM 9.1 9.4    Results/Tests Pending at Time of Discharge: none  Discharge Medications:    Medication List    STOP taking these medications        sulfamethoxazole-trimethoprim 800-160 MG per tablet  Commonly known as:  BACTRIM DS,SEPTRA DS      TAKE these medications        citalopram 10 MG tablet  Commonly known as:  CELEXA  Take 10 mg by mouth daily.     feeding supplement (ENSURE ENLIVE) Liqd  Take 237 mLs by mouth 2 (two) times daily between meals.     HYDROcodone-acetaminophen 5-325 MG per tablet  Commonly known as:  NORCO/VICODIN  Take 1 tablet by mouth every 6 (  six) hours as needed for moderate pain.     ibuprofen 200 MG tablet  Commonly known as:  ADVIL,MOTRIN  Take 4 tablets (800 mg total) by mouth every 8 (eight) hours as needed for mild pain or moderate pain.     oxyCODONE 5 MG immediate release tablet  Commonly known as:  ROXICODONE  Take 1-2 tablets (5-10 mg total) by mouth every 4 (four) hours as needed for moderate pain or severe pain.     piperacillin-tazobactam 3.375 GM/50ML IVPB  Commonly known as:  ZOSYN  Inject 50 mLs  (3.375 g total) into the vein every 8 (eight) hours. Stop on 12/06/2014.     polyethylene glycol packet  Commonly known as:  MIRALAX / GLYCOLAX  Take 17 g by mouth daily as needed for mild constipation.        Discharge Instructions: Please refer to Patient Instructions section of EMR for full details.  Patient was counseled important signs and symptoms that should prompt return to medical care, changes in medications, dietary instructions, activity restrictions, and follow up appointments.   Follow-Up Appointments: Follow-up Information    Follow up with [facility]              On 11/19/2014.   Why:  You have an appointment for 11:00 am on Monday 11/19/2014   Contact information:   [address] [place] [address] [phone]      Follow up with Toni Arthurs, MD. Schedule an appointment as soon as possible for a visit in 3 weeks.   Specialty:  Orthopedic Surgery   Contact information:   [address] [place] [address] [phone]       Follow up with [facility].   Why:  They will contact you to schedule nurse visits.   Contact information:   [address] [place] [address] [phone]       Follow up with [facility]     On 11/23/2014.   Why:  appointment at 11:30am Friday 11/23/14, arrive at 11:56min    Contact information:   [address] [place] [address] [phone]      Follow up with Toni Arthurs, MD. Schedule an appointment as soon as possible for a visit in 3 weeks.   Specialty:  Orthopedic Surgery   Contact information:   [address] [place] [address] [phone]      Almon Hercules, MD 11/17/2014, 11:11 AM PGY-1, [facility]

## 2014-11-09 NOTE — Progress Notes (Signed)
Pt went off floor again had disconnected  IV.

## 2014-11-09 NOTE — Progress Notes (Signed)
Pt went off floor security found him going to smoke escorted back to floor told pt he is not to leave floor to smoke.

## 2014-11-09 NOTE — Progress Notes (Signed)
Subjective:   Procedure(s) (LRB): IRRIGATION AND DEBRIDEMENT WITH SURGICAL PREP OF RIGHT FOOT WOUND  (Right) APPLICATION OF A-CELL AND POSSIBLE VAC  (Right)  Patient reports pain as mild.  Nurse reports that patient was outside smoking with Dr. Kelly Splinter attempted to evaluate him last night.  Reports that his foot and leg feel better after being on IV abx.  He was resting in NAD eating breakfast this morning.  Objective:   VITALS:  Temp:  [98 F (36.7 C)-98.9 F (37.2 C)] 98 F (36.7 C) (07/22 0536) Pulse Rate:  [82-84] 82 (07/22 0536) Resp:  [18-20] 18 (07/22 0536) BP: (119-143)/(53-74) 119/53 mmHg (07/22 0536) SpO2:  [96 %-100 %] 96 % (07/22 0536)  Neurologically intact ABD soft Neurovascular intact Sensation intact distally Dorsiflexion/Plantar flexion intact Erythema noted from distal R LE to just above medial knee with mild-mod edema.   Popliteal pulse 2+ Able to perform quad set.  Full ankle ROM compared bilaterally.  LABS  Recent Labs  11/07/14 2006  HGB 12.3*  WBC 8.4  PLT 339    Recent Labs  11/07/14 2006  NA 135  K 4.1  CL 101  CO2 24  BUN 16  CREATININE 1.06  GLUCOSE 164*   No results for input(s): LABPT, INR in the last 72 hours.   Assessment/Plan:   Procedure(s) (LRB): IRRIGATION AND DEBRIDEMENT WITH SURGICAL PREP OF RIGHT FOOT WOUND  (Right) APPLICATION OF A-CELL AND POSSIBLE VAC  (Right)  Continue IV abx per medicine. Up with therapy WBAT on his heel in CAM boot Added IBU  TID per patient request for inflammation.   Alfredo Martinez, PA-C, [facility] [employer] Office:  [phone]

## 2014-11-09 NOTE — Progress Notes (Signed)
Family Medicine Teaching Service Daily Progress Note Intern Pager: [phone]  Patient name: [name] [name] Medical record number: [id number] Date of birth: January 17, 1966 Age: 49 y.o. Gender: male  Primary Care Provider: No PCP Per Patient Consultants: ortho Code Status: full  Pt Overview and Major Events to Date:  7/20: patient admitted with RLE swelling. Started on vanc and zosyn  Assessment and Plan:  [name] [name] is a 49 y.o. male presenting with right foot cellulitis . PMH is significant for depression and smoking  RLE cellulitis: improved. Concerning for osteomyelitis. S/p three days of vanc and zosyn and two weeks of Bactrim. Circular clean appearing wound about 2 inch in diameter over the dorsum right foot on admission. Swelling and erythema to knee level on admission and improved this morning. Some diminished sensation over medial aspect of right leg that has resolved. Good range of motion at knee and ankle. No s&s of systemic infection. CMP and CBC WNL. Sed R of 53. Foot X-ray positive for increased osteolysis involving second through fifth metatarsal heads, couldn't exclude osteomyelitis. Less likely to be DVT given wells score of 1.  His swelling and paraesthesia improved. Pain improved with ibuprofen. Ortho (Dr. Victorino Dike) is not sure if the osteolysis is concerning for osteo. He discussed with him about a plan for possible further debridement under nerve block on 7/21. Dr. Earlie Server, who was to do a debridement went by his room but couldn't find him in the room. She will try again today per her note. - Admit to med-surge, Dr. Randolm Idol attending  - Continue Vanc and zosyn - Consider MRI - Morphine 2 mg q3h prn severe pain. - D/c Vicodin given hx of allergy. Not helpful either - Tylenol 650 mg q6h prn mild pain - Ibuprofen 800 mg tid  Depression: on celexa 10 mg at home - continue home celexa  Smoking: 25PY. Discussed about its impact on his wound healing. He declined nicotine  patch or meds. He said he can quit on his own. - Nicotine patch if requested  FEN/GI: Miralax daily PRN, protonix Prophylaxis: heparin SubQ  FEN/GI: protonix  PPx: hepain Subq  Disposition: floor.  Subjective:  Reports pain has improved with ibuprofen. Swelling and reddness has also improved. Able ambulate.  Objective: Temp:  [98 F (36.7 C)-98.9 F (37.2 C)] 98 F (36.7 C) (07/22 0536) Pulse Rate:  [82-84] 82 (07/22 0536) Resp:  [18-20] 18 (07/22 0536) BP: (119-143)/(53-74) 119/53 mmHg (07/22 0536) SpO2:  [96 %-100 %] 96 % (07/22 0536) Physical Exam: Gen: No acute distress. Alert, cooperative with exam CV: RRR. No murmurs Resp: CTAB. No wheezing, crackles, or rhonchi noted. Abd: +BS. Soft, non-distended, non-tender. No rebound or guarding.  RLE: foot dressed this morning. Swelling and erythema improved. No tenderness to palpation,  sensation intact. good range of motion at knee and ankle. Pins in place in his right great toe . Neuro: Alert and oriented, No gross focal deficits Laboratory:  Recent Labs Lab 11/07/14 2006  WBC 8.4  HGB 12.3*  HCT 36.9*  PLT 339    Recent Labs Lab 11/07/14 2006  NA 135  K 4.1  CL 101  CO2 24  BUN 16  CREATININE 1.06  CALCIUM 8.9  PROT 7.1  BILITOT 0.2*  ALKPHOS 61  ALT 17  AST 18  GLUCOSE 164*    Imaging/Diagnostic Tests: Dg Foot Complete Right  11/07/2014   IMPRESSION: Internal fixation of comminuted fracture of proximal phalanx of great toe. Stable appearance of fractures of the phalanges of  the second through fifth toes.  Increased osteolysis involving second through fifth metatarsal heads ; osteomyelitis cannot be excluded.   Electronically Signed   By: Myles Rosenthal M.D.   On: 11/07/2014 20:44   The above note was prepared by: Almon Hercules, MD 11/09/2014, 8:58 AM PGY-1, [facility] FPTS Intern pager: [phone], text pages welcome

## 2014-11-09 NOTE — Progress Notes (Signed)
Initial Nutrition Assessment  DOCUMENTATION CODES:   Obesity unspecified  INTERVENTION:   Provide Ensure Enlive po once daily, each supplement provides 350 kcal and 20 grams of protein.  Encourage adequate PO intake.   NUTRITION DIAGNOSIS:   Increased nutrient needs related to wound healing as evidenced by estimated needs.  GOAL:   Patient will meet greater than or equal to 90% of their needs  MONITOR:   PO intake, Supplement acceptance, Weight trends, Labs, I & O's  REASON FOR ASSESSMENT:   Consult Wound healing  ASSESSMENT:   49 yo male who sustained a crush injury to the right foot on 09/25/14. He was found to have 2nd and 5th toe fractures and was going to undergo surgery, but was arrested. He was readmitted to the hospital 10/21/14-10/24/14 with cellulitis of the foot and an MRI during that admission showed fractures of the 1st through 5th toes and hematoma. He underwent ORIF of the right hallux fracture, amputation of the 4th toe tuft, right 5th toe amputation and I&D of the wounds with Acell placement on 11/01/14. He [facility] and readmitted due to worsening cellulitis and edema. Plan for OR on Monday for repeat I&D.  Pt reports having a good appetite currently and PTA with no other difficulties. Meal completion has been 100%. Pt does endorse weight loss, however it has been intentional as pt has been wanting to lose weight. Pt is agreeable to Ensure to aid in caloric and protein needs for wound healing. RD to order.   Pt with no observed significant fat or muscle mass loss.   Labs and medications reviewed.   Diet Order:  Diet regular Room service appropriate?: Yes; Fluid consistency:: Thin  Skin:  Wound (see comment) (Incision and wound on R foot, +1 RLE edema)  Last BM:  7/20  Height:   Ht Readings from Last 1 Encounters:  11/07/14  (1.727 m)    Weight:   Wt Readings from Last 1 Encounters:  11/07/14 205 lb 8 oz (93.214 kg)     Ideal Body Weight:  70 kg  Wt Readings from Last 10 Encounters:  11/07/14 205 lb 8 oz (93.214 kg)  11/01/14 202 lb (91.627 kg)  10/22/14 213 lb 3 oz (96.7 kg)    BMI:  Body mass index is 31.25 kg/(m^2).  Estimated Nutritional Needs:   Kcal:  2050-2250  Protein:  110-125 grams  Fluid:  2- 2.2 L/day  EDUCATION NEEDS:   No education needs identified at this time  Roslyn Smiling, MS, RD, LDN Pager # [phone] After hours/ weekend pager # [phone]

## 2014-11-10 DIAGNOSIS — L97519 Non-pressure chronic ulcer of other part of right foot with unspecified severity: Secondary | ICD-10-CM
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
## 2014-11-11 ENCOUNTER — Other Ambulatory Visit: Payer: Self-pay | Admitting: Plastic Surgery

## 2014-11-11 DIAGNOSIS — L0390 Cellulitis, unspecified: Secondary | ICD-10-CM

## 2014-11-11 DIAGNOSIS — T17998A Other foreign object in respiratory tract, part unspecified causing other injury, initial encounter: Secondary | ICD-10-CM

## 2014-11-11 DIAGNOSIS — L97513 Non-pressure chronic ulcer of other part of right foot with necrosis of muscle: Secondary | ICD-10-CM

## 2014-11-11 DIAGNOSIS — L97523 Non-pressure chronic ulcer of other part of left foot with necrosis of muscle: Secondary | ICD-10-CM

## 2014-11-11 DIAGNOSIS — M869 Osteomyelitis, unspecified: Secondary | ICD-10-CM

## 2014-11-11 LAB — VANCOMYCIN, TROUGH: Vancomycin Tr: 15 ug/mL (ref 10.0–20.0)

## 2014-11-11 NOTE — Progress Notes (Signed)
ANTIBIOTIC CONSULT NOTE - FOLLOW UP  Pharmacy Consult for Vanc and Zosyn  Indication: Cellulitis  Allergies  Allergen Reactions  . Hydrocodone-Acetaminophen Itching    Patient Measurements: Height:  (172.7 cm) Weight: 205 lb 8 oz (93.214 kg) IBW/kg (Calculated) : 68.4  Vital Signs: Temp: 98.9 F (37.2 C) (07/24 1348) Temp Source: Oral (07/24 1348) BP: 128/59 mmHg (07/24 1348) Pulse Rate: 80 (07/24 1348) Intake/Output from previous day: 07/23 0701 - 07/24 0700 In: 2430 [P.O.:1280; IV Piggyback:1150] Out: -  Intake/Output from this shift: Total I/O In: 360 [P.O.:360] Out: -   Labs: No results for input(s): WBC, HGB, PLT, LABCREA, CREATININE in the last 72 hours. Estimated Creatinine Clearance: 94.4 mL/min (by C-G formula based on Cr of 1.06).  Recent Labs  11/11/14 1350  VANCOTROUGH 15     Microbiology: Recent Results (from the past 720 hour(s))  Surgical pcr screen     Status: None   Collection Time: 10/22/14 12:52 AM  Result Value Ref Range Status   MRSA, PCR NEGATIVE NEGATIVE Final   Staphylococcus aureus NEGATIVE NEGATIVE Final    Comment:        The Xpert SA Assay (FDA approved for NASAL specimens in patients over 19 years of age), is one component of a comprehensive surveillance program.  Test performance has been validated by [facility] for patients greater than or equal to 52 year old. It is not intended to diagnose infection nor to guide or monitor treatment.   Culture, blood (x 2)     Status: None   Collection Time: 10/22/14  1:28 AM  Result Value Ref Range Status   Specimen Description BLOOD LEFT ANTECUBITAL  Final   Special Requests BOTTLES DRAWN AEROBIC AND ANAEROBIC 10CC  Final   Culture NO GROWTH 5 DAYS  Final   Report Status 10/27/2014 FINAL  Final  Culture, blood (x 2)     Status: None   Collection Time: 10/22/14  1:38 AM  Result Value Ref Range Status   Specimen Description BLOOD LEFT HAND  Final   Special Requests BOTTLES  DRAWN AEROBIC ONLY 10CC  Final   Culture NO GROWTH 5 DAYS  Final   Report Status 10/27/2014 FINAL  Final    Anti-infectives    Start     Dose/Rate Route Frequency Ordered Stop   11/08/14 0400  piperacillin-tazobactam (ZOSYN) IVPB 3.375 g     3.375 g 12.5 mL/hr over 240 Minutes Intravenous Every 8 hours 11/07/14 2351     11/08/14 0000  vancomycin (VANCOCIN) IVPB 1000 mg/200 mL premix     1,000 mg 200 mL/hr over 60 Minutes Intravenous Every 8 hours 11/07/14 2351     11/07/14 2245  vancomycin (VANCOCIN) IVPB 1000 mg/200 mL premix  Status:  Discontinued     1,000 mg 200 mL/hr over 60 Minutes Intravenous  Once 11/07/14 2232 11/07/14 2343   11/07/14 2245  piperacillin-tazobactam (ZOSYN) IVPB 3.375 g     3.375 g 100 mL/hr over 30 Minutes Intravenous  Once 11/07/14 2232 11/07/14 2309      Assessment: [name] YOM being treated for RLE cellulitis (osteo cannot be ruled out) With Vanc and Zosyn (day 5)   Vanc trough on 7/24 = 15   Goal of Therapy:  Vanc through level 15-20 mcg/ml  Plan:  -Continue treatment w/ Vanc and Zosyn   -monitor renal function   Laquandra Carrillo C. Marvis Moeller, PharmD Pharmacy Resident  Pager: [phone] 11/11/2014 3:04 PM
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
## 2014-11-12 ENCOUNTER — Inpatient Hospital Stay (HOSPITAL_COMMUNITY): Payer: Medicaid Other | Admitting: Certified Registered"

## 2014-11-12 ENCOUNTER — Encounter (HOSPITAL_COMMUNITY)
Admission: EM | Disposition: A | Discharge status: Home Health | Payer: Self-pay | Source: Home / Self Care | Attending: Family Medicine

## 2014-11-12 ENCOUNTER — Inpatient Hospital Stay (HOSPITAL_COMMUNITY): Payer: Medicaid Other

## 2014-11-12 ENCOUNTER — Encounter (HOSPITAL_COMMUNITY): Payer: Self-pay | Admitting: Anesthesiology

## 2014-11-12 HISTORY — PX: APPLICATION OF A-CELL OF EXTREMITY: SHX6303

## 2014-11-12 HISTORY — PX: I&D EXTREMITY: SHX5045

## 2014-11-12 HISTORY — PX: APPLICATION OF WOUND VAC: SHX5189

## 2014-11-12 SURGERY — IRRIGATION AND DEBRIDEMENT EXTREMITY
Anesthesia: General | Site: Foot | Laterality: Right

## 2014-11-12 MED ORDER — LIDOCAINE HCL (CARDIAC) 20 MG/ML IV SOLN
INTRAVENOUS | Status: DC | PRN
  Administered 2014-11-12: 100 mg via INTRAVENOUS

## 2014-11-12 MED ORDER — HYDROMORPHONE HCL 1 MG/ML IJ SOLN
INTRAMUSCULAR | Status: AC
  Filled 2014-11-12: qty 1

## 2014-11-12 MED ORDER — MIDAZOLAM HCL 5 MG/5ML IJ SOLN
INTRAMUSCULAR | Status: DC | PRN
  Administered 2014-11-12: 2 mg via INTRAVENOUS

## 2014-11-12 MED ORDER — FENTANYL CITRATE (PF) 100 MCG/2ML IJ SOLN
INTRAMUSCULAR | Status: DC | PRN
  Administered 2014-11-12 (×2): 50 ug via INTRAVENOUS

## 2014-11-12 MED ORDER — EPHEDRINE SULFATE 50 MG/ML IJ SOLN
INTRAMUSCULAR | Status: DC | PRN
  Administered 2014-11-12 (×2): 10 mg via INTRAVENOUS

## 2014-11-12 MED ORDER — PROPOFOL 10 MG/ML IV BOLUS
INTRAVENOUS | Status: AC
  Filled 2014-11-12: qty 20

## 2014-11-12 MED ORDER — PROPOFOL 10 MG/ML IV BOLUS
INTRAVENOUS | Status: DC | PRN
  Administered 2014-11-12: 200 mg via INTRAVENOUS

## 2014-11-12 MED ORDER — ONDANSETRON HCL 4 MG/2ML IJ SOLN
INTRAMUSCULAR | Status: DC | PRN
  Administered 2014-11-12: 8 mg via INTRAVENOUS

## 2014-11-12 MED ORDER — HYDROMORPHONE HCL 1 MG/ML IJ SOLN
0.5000 mg | INTRAMUSCULAR | Status: AC | PRN
  Administered 2014-11-12 (×4): 0.5 mg via INTRAVENOUS

## 2014-11-12 MED ORDER — FENTANYL CITRATE (PF) 250 MCG/5ML IJ SOLN
INTRAMUSCULAR | Status: AC
  Filled 2014-11-12: qty 5

## 2014-11-12 MED ORDER — 0.9 % SODIUM CHLORIDE (POUR BTL) OPTIME
TOPICAL | Status: DC | PRN
  Administered 2014-11-12: 1000 mL

## 2014-11-12 MED ORDER — ONDANSETRON HCL 4 MG/2ML IJ SOLN: 4.0000 mg | Freq: Once | INTRAMUSCULAR | Status: DC | PRN

## 2014-11-12 MED ORDER — SODIUM CHLORIDE 0.9 % IR SOLN
Status: DC | PRN
  Administered 2014-11-12: 500 mL

## 2014-11-12 MED ORDER — PHENYLEPHRINE HCL 10 MG/ML IJ SOLN
INTRAMUSCULAR | Status: DC | PRN
  Administered 2014-11-12 (×3): 80 ug via INTRAVENOUS

## 2014-11-12 MED ORDER — LACTATED RINGERS IV SOLN
INTRAVENOUS | Status: DC | PRN
  Administered 2014-11-12: 14:00:00 via INTRAVENOUS

## 2014-11-12 MED ORDER — MIDAZOLAM HCL 2 MG/2ML IJ SOLN
INTRAMUSCULAR | Status: AC
  Filled 2014-11-12: qty 2

## 2014-11-12 SURGICAL SUPPLY — 58 items
APL SKNCLS STERI-STRIP NONHPOA (GAUZE/BANDAGES/DRESSINGS) ×1
BANDAGE ELASTIC 4 VELCRO ST LF (GAUZE/BANDAGES/DRESSINGS) ×2 IMPLANT
BENZOIN TINCTURE PRP APPL 2/3 (GAUZE/BANDAGES/DRESSINGS) ×2 IMPLANT
BLADE SURG ROTATE 9660 (MISCELLANEOUS) IMPLANT
BNDG GAUZE ELAST 4 BULKY (GAUZE/BANDAGES/DRESSINGS) ×2 IMPLANT
CANISTER SUCTION 2500CC (MISCELLANEOUS) ×3 IMPLANT
CANISTER WOUND CARE 500ML ATS (WOUND CARE) ×2 IMPLANT
CHLORAPREP W/TINT 26ML (MISCELLANEOUS) IMPLANT
COVER SURGICAL LIGHT HANDLE (MISCELLANEOUS) ×5 IMPLANT
DRAPE EXTREMITY T 121X128X90 (DRAPE) IMPLANT
DRAPE ORTHO SPLIT 77X108 STRL (DRAPES)
DRAPE SURG ORHT 6 SPLT 77X108 (DRAPES) IMPLANT
DRSG ADAPTIC 3X8 NADH LF (GAUZE/BANDAGES/DRESSINGS) ×2 IMPLANT
DRSG PAD ABDOMINAL 8X10 ST (GAUZE/BANDAGES/DRESSINGS) IMPLANT
DRSG VAC ATS LRG SENSATRAC (GAUZE/BANDAGES/DRESSINGS) IMPLANT
DRSG VAC ATS MED SENSATRAC (GAUZE/BANDAGES/DRESSINGS) ×2 IMPLANT
DRSG VAC ATS SM SENSATRAC (GAUZE/BANDAGES/DRESSINGS) IMPLANT
ELECT CAUTERY BLADE 6.4 (BLADE) ×3 IMPLANT
ELECT REM PT RETURN 9FT ADLT (ELECTROSURGICAL) ×3
ELECTRODE REM PT RTRN 9FT ADLT (ELECTROSURGICAL) ×1 IMPLANT
GAUZE SPONGE 4X4 12PLY STRL (GAUZE/BANDAGES/DRESSINGS) IMPLANT
GLOVE BIO SURGEON STRL SZ 6.5 (GLOVE) ×4 IMPLANT
GLOVE BIO SURGEONS STRL SZ 6.5 (GLOVE) ×2
GLOVE BIOGEL PI IND STRL 6.5 (GLOVE) IMPLANT
GLOVE BIOGEL PI INDICATOR 6.5 (GLOVE) ×4
GLOVE SURG SS PI 7.0 STRL IVOR (GLOVE) ×2 IMPLANT
GOWN STRL REUS W/ TWL LRG LVL3 (GOWN DISPOSABLE) ×3 IMPLANT
GOWN STRL REUS W/ TWL XL LVL3 (GOWN DISPOSABLE) IMPLANT
GOWN STRL REUS W/TWL LRG LVL3 (GOWN DISPOSABLE) ×9
GOWN STRL REUS W/TWL XL LVL3 (GOWN DISPOSABLE) ×3
HANDPIECE INTERPULSE COAX TIP (DISPOSABLE)
KIT BASIN OR (CUSTOM PROCEDURE TRAY) ×3 IMPLANT
KIT ROOM TURNOVER OR (KITS) ×3 IMPLANT
MATRIX SURGICAL PSM 7X10CM (Tissue) ×2 IMPLANT
MICROMATRIX 1000MG (Tissue) ×6 IMPLANT
NS IRRIG 1000ML POUR BTL (IV SOLUTION) ×3 IMPLANT
PACK GENERAL/GYN (CUSTOM PROCEDURE TRAY) ×1 IMPLANT
PACK ORTHO EXTREMITY (CUSTOM PROCEDURE TRAY) ×3 IMPLANT
PAD ABD 8X10 STRL (GAUZE/BANDAGES/DRESSINGS) IMPLANT
PAD ARMBOARD 7.5X6 YLW CONV (MISCELLANEOUS) ×6 IMPLANT
PAD NEG PRESSURE SENSATRAC (MISCELLANEOUS) IMPLANT
PENCIL BUTTON HOLSTER BLD 10FT (ELECTRODE) ×2 IMPLANT
SET HNDPC FAN SPRY TIP SCT (DISPOSABLE) IMPLANT
SOLUTION PARTIC MCRMTRX 1000MG (Tissue) IMPLANT
STOCKINETTE IMPERVIOUS 9X36 MD (GAUZE/BANDAGES/DRESSINGS) IMPLANT
STOCKINETTE IMPERVIOUS LG (DRAPES) IMPLANT
SURGILUBE 2OZ TUBE FLIPTOP (MISCELLANEOUS) ×2 IMPLANT
SUT SILK 4 0 PS 2 (SUTURE) ×2 IMPLANT
SUT VIC AB 5-0 PS2 18 (SUTURE) ×8 IMPLANT
SWAB COLLECTION DEVICE MRSA (MISCELLANEOUS) ×2 IMPLANT
SWAB CULTURE LIQUID MINI MALE (MISCELLANEOUS) ×2 IMPLANT
TOWEL OR 17X24 6PK STRL BLUE (TOWEL DISPOSABLE) ×3 IMPLANT
TOWEL OR 17X26 10 PK STRL BLUE (TOWEL DISPOSABLE) ×3 IMPLANT
TUBE CONNECTING 12'X1/4 (SUCTIONS) ×1
TUBE CONNECTING 12X1/4 (SUCTIONS) ×2 IMPLANT
UNDERPAD 30X30 INCONTINENT (UNDERPADS AND DIAPERS) ×3 IMPLANT
WATER STERILE IRR 1000ML POUR (IV SOLUTION) IMPLANT
YANKAUER SUCT BULB TIP NO VENT (SUCTIONS) ×3 IMPLANT
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
## 2014-11-12 NOTE — Anesthesia Postprocedure Evaluation (Signed)
  Anesthesia Post-op Note  Patient: [name] [name]  Procedure(s) Performed: Procedure(s): IRRIGATION AND DEBRIDEMENT WITH SURGICAL PREP OF RIGHT FOOT WOUND  (Right) APPLICATION OF A-CELL  (Right) APPLICATION OF WOUND VAC (Right)  Patient Location: PACU  Anesthesia Type:General  Level of Consciousness: awake, alert , oriented and patient cooperative  Airway and Oxygen Therapy: Patient Spontanous Breathing  Post-op Pain: mild  Post-op Assessment: Post-op Vital signs reviewed, Patient's Cardiovascular Status Stable, Respiratory Function Stable, Patent Airway, No signs of Nausea or vomiting and Pain level controlled     RLE Motor Response: Purposeful movement, Responds to commands, No tremor RLE Sensation: Decreased, Pain      Post-op Vital Signs: stable  Last Vitals:  Filed Vitals:   11/12/14 1600  BP: 126/66  Pulse: 86  Temp:   Resp: 9    Complications: No apparent anesthesia complications

## 2014-11-12 NOTE — Interval H&P Note (Signed)
History and Physical Interval Note:  11/12/2014 2:29 PM  [name] [name]  has presented today for surgery, with the diagnosis of RIGHT FOOT WOUND   The various methods of treatment have been discussed with the patient and family. After consideration of risks, benefits and other options for treatment, the patient has consented to  Procedure(s): IRRIGATION AND DEBRIDEMENT WITH SURGICAL PREP OF RIGHT FOOT WOUND  (Right) APPLICATION OF A-CELL AND POSSIBLE VAC  (Right) as a surgical intervention .  The patient's history has been reviewed, patient examined, no change in status, stable for surgery.  I have reviewed the patient's chart and labs.  Questions were answered to the patient's satisfaction.     SANGER,Luigi Stuckey
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
## 2014-11-12 NOTE — Progress Notes (Signed)
Writer was advised by security that the patient had been found earlier in the evening outside the [employer] smoking.  I advised the pt the we are a tobacco free campus and smoking was not permitted on the campus.  He advised that "I don't smoke."  I advised the patient if that was the case, he had no reason to leave the unit.  I explained the safety issues related to him leaving the unit.  He assured me that he would not leave the unit again.  I advised staffing to call me and/or security for any issues or concerns.
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
## 2014-11-12 NOTE — Transfer of Care (Signed)
Immediate Anesthesia Transfer of Care Note  Patient: [name] [name]  Procedure(s) Performed: Procedure(s): IRRIGATION AND DEBRIDEMENT WITH SURGICAL PREP OF RIGHT FOOT WOUND  (Right) APPLICATION OF A-CELL  (Right) APPLICATION OF WOUND VAC (Right)  Patient Location: PACU  Anesthesia Type:General  Level of Consciousness: awake, alert , oriented and patient cooperative  Airway & Oxygen Therapy: Patient Spontanous Breathing and Patient connected to nasal cannula oxygen  Post-op Assessment: Report given to RN and Post -op Vital signs reviewed and stable  Post vital signs: Reviewed and stable  Last Vitals:  Filed Vitals:   11/12/14 1530  BP:   Pulse:   Temp: 36.5 C  Resp:     Complications: respiratory complications  Possible aspiration with LMA...patient admits to drinking some soda and eating a plum mid morning...previously he reported that his last po intake was 2300 11/12/14

## 2014-11-12 NOTE — Progress Notes (Signed)
Family Medicine Teaching Service Daily Progress Note Intern Pager: [phone]  Patient name: [name] [name] Medical record number: [id number] Date of birth: 20-Dec-1965 Age: 49 y.o. Gender: male  Primary Care Provider: No PCP Per Patient Consultants: ortho Code Status: full  Pt Overview and Major Events to Date:  7/20: patient admitted with RLE swelling. Started on vanc and zosyn  Assessment and Plan:  [name] [name] is a 48 y.o. male presenting with right foot cellulitis . PMH is significant for depression and smoking  RLE cellulitis: improving. Concerning for osteomyelitis although MRI does not suggest. Currently stable on antibiotics. - Continue Vanc and zosyn. Vanc trough 15 7/24 - Morphine 2 mg q3h prn severe pain - Tylenol 650 mg q6h prn mild pain - Ibuprofen 800 mg tid - A1c today - BMP and CBC in AM (7/26)   Depression: on celexa 10 mg at home - continue home celexa  Smoking: 25PY. Discussed about its impact on his wound healing previously. He declined nicotine patch or meds. He said he can quit on his own. - will discuss about this again today - Nicotine patch if requested  FEN/GI: Miralax daily PRN, protonix Prophylaxis: heparin SubQ  FEN/GI: protonix  PPx: hepain Subq  Disposition: home pending surgery on 7/25  Subjective:  Patient reports no problems overnight. Denies pain. Swelling and erythema has greatly improved. Aware of the plan with plastic surgery. There was a note from overnight RN saying that patient was smoking in hallway.   Objective: Temp:  [98.5 F (36.9 C)-98.9 F (37.2 C)] 98.5 F (36.9 C) (07/25 0705) Pulse Rate:  [74-80] 74 (07/25 0705) Resp:  [18-20] 18 (07/25 0705) BP: (128-144)/(59-75) 129/70 mmHg (07/25 0705) SpO2:  [97 %-100 %] 97 % (07/25 0705)  Physical Exam: Gen: No acute distress. Alert, cooperative with exam CV: RRR. No murmurs Resp: CTAB. No wheezing, crackles, or rhonchi noted. Abd: +BS. Soft, non-distended,  non-tender. No rebound or guarding RLE: foot wound with dressing off,  trace edema. No tenderness. Pins in place in his right great toe Neuro: Alert and oriented, No gross focal deficits  Laboratory:  Recent Labs Lab 11/07/14 2006  WBC 8.4  HGB 12.3*  HCT 36.9*  PLT 339    Recent Labs Lab 11/07/14 2006  NA 135  K 4.1  CL 101  CO2 24  BUN 16  CREATININE 1.06  CALCIUM 8.9  PROT 7.1  BILITOT 0.2*  ALKPHOS 61  ALT 17  AST 18  GLUCOSE 164*    Imaging/Diagnostic Tests: No results found.  Almon Hercules, MD 11/12/2014, 9:49 AM PGY-1, [facility] FPTS Intern pager: [phone], text pages welcome

## 2014-11-12 NOTE — Anesthesia Procedure Notes (Signed)
Procedure Name: LMA Insertion Performed by: Renae Fickle Pre-anesthesia Checklist: Patient identified, Emergency Drugs available, Suction available and Patient being monitored Patient Re-evaluated:Patient Re-evaluated prior to inductionOxygen Delivery Method: Circle system utilized Preoxygenation: Pre-oxygenation with 100% oxygen Intubation Type: IV induction Ventilation: Mask ventilation without difficulty LMA: LMA with gastric port inserted LMA Size: 4.0 Number of attempts: 1

## 2014-11-12 NOTE — Brief Op Note (Signed)
11/07/2014 - 11/12/2014  2:29 PM  PATIENT:  [name] [name]  49 y.o. male  PRE-OPERATIVE DIAGNOSIS:  RIGHT FOOT WOUND   POST-OPERATIVE DIAGNOSIS:  RIGHT FOOT WOUND   PROCEDURE:  Procedure(s): IRRIGATION AND DEBRIDEMENT WITH SURGICAL PREP OF RIGHT FOOT WOUND  (Right) APPLICATION OF A-CELL AND POSSIBLE VAC  (Right)  SURGEON:  Surgeon(s) and Role:    * Claire Sanger, DO - Primary  PHYSICIAN ASSISTANT: Shawn Rayburn, PA  ASSISTANTS: none   ANESTHESIA:   general  EBL:     BLOOD ADMINISTERED:none  DRAINS: none   LOCAL MEDICATIONS USED:  NONE  SPECIMEN:  Source of Specimen:  right foot wound  DISPOSITION OF SPECIMEN:  micro  COUNTS:  YES  TOURNIQUET:  * No tourniquets in log *  DICTATION: .Dragon Dictation  PLAN OF CARE: Admit to inpatient   PATIENT DISPOSITION:  PACU - hemodynamically stable.   Delay start of Pharmacological VTE agent (>24hrs) due to surgical blood loss or risk of bleeding: no

## 2014-11-12 NOTE — Anesthesia Preprocedure Evaluation (Addendum)
Anesthesia Evaluation  Patient identified by MRN, date of birth, ID band Patient awake    Airway Mallampati: II  TM Distance: >3 FB Neck ROM: full    Dental  (+) Teeth Intact, Dental Advidsory Given   Pulmonary Current Smoker,  breath sounds clear to auscultation        Cardiovascular Exercise Tolerance: Good negative cardio ROS  Rhythm:regular     Neuro/Psych    GI/Hepatic negative GI ROS, Neg liver ROS,   Endo/Other  negative endocrine ROS  Renal/GU negative Renal ROS     Musculoskeletal negative musculoskeletal ROS (+)   Abdominal   Peds  Hematology negative hematology ROS (+)   Anesthesia Other Findings   Reproductive/Obstetrics                            Anesthesia Physical Anesthesia Plan  ASA: II  Anesthesia Plan:    Post-op Pain Management:    Induction:   Airway Management Planned:   Additional Equipment:   Intra-op Plan:   Post-operative Plan:   Informed Consent:   Plan Discussed with:   Anesthesia Plan Comments:         Anesthesia Quick Evaluation

## 2014-11-13 ENCOUNTER — Encounter (HOSPITAL_COMMUNITY): Payer: Self-pay | Admitting: Plastic Surgery

## 2014-11-13 LAB — BASIC METABOLIC PANEL
ANION GAP: 9 (ref 5–15)
BUN: 11 mg/dL (ref 6–20)
CO2: 25 mmol/L (ref 22–32)
Calcium: 9.1 mg/dL (ref 8.9–10.3)
Chloride: 103 mmol/L (ref 101–111)
Creatinine, Ser: 0.82 mg/dL (ref 0.61–1.24)
GFR calc Af Amer: >60 mL/min (ref >60)
GLUCOSE: 115 mg/dL — AB (ref 65–99)
POTASSIUM: 4.1 mmol/L (ref 3.5–5.1)
SODIUM: 137 mmol/L (ref 135–145)

## 2014-11-13 LAB — CBC
HCT: 35.2 % — ABNORMAL LOW (ref 39.0–52.0)
HEMOGLOBIN: 11.5 g/dL — AB (ref 13.0–17.0)
MCH: 30.1 pg (ref 26.0–34.0)
MCHC: 32.7 g/dL (ref 30.0–36.0)
MCV: 92.1 fL (ref 78.0–100.0)
Platelets: 346 10*3/uL (ref 150–400)
RBC: 3.82 MIL/uL — AB (ref 4.22–5.81)
RDW: 13.5 % (ref 11.5–15.5)
WBC: 8.8 10*3/uL (ref 4.0–10.5)

## 2014-11-13 LAB — GLUCOSE, CAPILLARY
GLUCOSE-CAPILLARY: 101 mg/dL — AB (ref 65–99)
GLUCOSE-CAPILLARY: 104 mg/dL — AB (ref 65–99)
GLUCOSE-CAPILLARY: 91 mg/dL (ref 65–99)
Glucose-Capillary: 105 mg/dL — ABNORMAL HIGH (ref 65–99)
Glucose-Capillary: 117 mg/dL — ABNORMAL HIGH (ref 65–99)

## 2014-11-13 MED ORDER — OXYCODONE-ACETAMINOPHEN 5-325 MG PO TABS
1.0000 | ORAL_TABLET | ORAL | Status: DC | PRN
  Administered 2014-11-13 – 2014-11-16 (×11): 1 via ORAL
  Filled 2014-11-13 (×11): qty 1

## 2014-11-13 MED ORDER — ACETAMINOPHEN 650 MG RE SUPP: 650.0000 mg | Freq: Four times a day (QID) | RECTAL | Status: DC

## 2014-11-13 MED ORDER — MORPHINE SULFATE 4 MG/ML IJ SOLN
4.0000 mg | INTRAMUSCULAR | Status: DC | PRN
  Administered 2014-11-13: 4 mg via INTRAVENOUS
  Filled 2014-11-13 (×2): qty 1

## 2014-11-13 MED ORDER — MORPHINE SULFATE 4 MG/ML IJ SOLN: 3.0000 mg | INTRAMUSCULAR | Status: DC | PRN

## 2014-11-13 MED ORDER — ACETAMINOPHEN 325 MG PO TABS
650.0000 mg | ORAL_TABLET | Freq: Four times a day (QID) | ORAL | Status: DC
  Administered 2014-11-13 – 2014-11-16 (×8): 650 mg via ORAL
  Filled 2014-11-13 (×8): qty 2

## 2014-11-13 MED ORDER — ENSURE ENLIVE PO LIQD
237.0000 mL | Freq: Two times a day (BID) | ORAL | Status: DC
  Administered 2014-11-14 – 2014-11-16 (×5): 237 mL via ORAL
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
## 2014-11-13 NOTE — Discharge Instructions (Addendum)
It is nice taking care of you. You came in with right leg cellulitis (infection of soft tissue). We have given you antibiotics to treat the infection through your vein. We will be sending you home with more medications to be given through PICC line.  You have multiple follow ups scheduled. We strongly recommed you go to those follow up. The address and time are listed on the discharge summary under follow up section.  We also recommend following up with your orthopedics in three weeks. Please give them a call as soon as possible. The address and phone number are listed under follow up section.  Bear weight on your injured foot in a cam walker boot.  Keep the pins protected.  Cellulitis Cellulitis is an infection of the skin and the tissue beneath it. The infected area is usually red and tender. Cellulitis occurs most often in the arms and lower legs.  CAUSES  Cellulitis is caused by bacteria that enter the skin through cracks or cuts in the skin. The most common types of bacteria that cause cellulitis are staphylococci and streptococci. SIGNS AND SYMPTOMS   Redness and warmth.  Swelling.  Tenderness or pain.  Fever. DIAGNOSIS  Your health care provider can usually determine what is wrong based on a physical exam. Blood tests may also be done. TREATMENT  Treatment usually involves taking an antibiotic medicine. HOME CARE INSTRUCTIONS   Take your antibiotic medicine as directed by your health care provider. Finish the antibiotic even if you start to feel better.  Keep the infected arm or leg elevated to reduce swelling.  Apply a warm cloth to the affected area up to 4 times per day to relieve pain.  Take medicines only as directed by your health care provider.  Keep all follow-up visits as directed by your health care provider. SEEK MEDICAL CARE IF:   You notice red streaks coming from the infected area.  Your red area gets larger or turns dark in color.  Your bone or joint  underneath the infected area becomes painful after the skin has healed.  Your infection returns in the same area or another area.  You notice a swollen bump in the infected area.  You develop new symptoms.  You have a fever. SEEK IMMEDIATE MEDICAL CARE IF:   You feel very sleepy.  You develop vomiting or diarrhea.  You have a general ill feeling (malaise) with muscle aches and pains. MAKE SURE YOU:   Understand these instructions.  Will watch your condition.  Will get help right away if you are not doing well or get worse. Document Released: 01/14/2005 Document Revised: 08/21/2013 Document Reviewed: 06/22/2011 [facility] Patient Information 2015 [place], [place]. This information is not intended to replace advice given to you by your health care provider. Make sure you discuss any questions you have with your health care provider.

## 2014-11-14 ENCOUNTER — Ambulatory Visit (HOSPITAL_COMMUNITY)
Admission: RE | Admit: 2014-11-14 | Payer: No Typology Code available for payment source | Source: Ambulatory Visit | Admitting: Plastic Surgery

## 2014-11-14 ENCOUNTER — Encounter (HOSPITAL_COMMUNITY): Admission: RE | Payer: Self-pay | Source: Ambulatory Visit

## 2014-11-14 LAB — GLUCOSE, CAPILLARY
GLUCOSE-CAPILLARY: 124 mg/dL — AB (ref 65–99)
Glucose-Capillary: 88 mg/dL (ref 65–99)

## 2014-11-14 LAB — HEMOGLOBIN A1C
Hgb A1c MFr Bld: 5.8 % — ABNORMAL HIGH (ref 4.8–5.6)
MEAN PLASMA GLUCOSE: 120 mg/dL

## 2014-11-14 SURGERY — IRRIGATION AND DEBRIDEMENT EXTREMITY
Anesthesia: General | Laterality: Right

## 2014-11-14 MED ORDER — DIAZEPAM 5 MG PO TABS
5.0000 mg | ORAL_TABLET | Freq: Once | ORAL | Status: AC
  Administered 2014-11-14: 5 mg via ORAL
  Filled 2014-11-14: qty 1

## 2014-11-14 NOTE — Care Management Note (Signed)
Case Management Note  Patient Details  Name: [name] [name] MRN: [id number] Date of Birth: 1965/08/07  Subjective/Objective:        Cellulitis of right foot            Action/Plan: Spoke with patient about possible home wound VAC and possible home IV antibiotics. Patient states that he lives with 49yo son and patient's mother who is a retired Patent examiner assists him prn. Patient agreeable to treatment as needed and stated that he does not anticipate needing assistance with paying for medications. Will follow to set up HHC and VAC as ordered. VAC forms on chart for MD signature if needed.   Expected Discharge Date:                  Expected Discharge Plan:  Home w Home Health Services  In-House Referral:  Financial Counselor  Discharge planning Services  CM Consult  Post Acute Care Choice:    Choice offered to:     DME Arranged:    DME Agency:     HH Arranged:    HH Agency:     Status of Service:  In process, will continue to follow  Medicare Important Message Given:    Date Medicare IM Given:    Medicare IM give by:    Date Additional Medicare IM Given:    Additional Medicare Important Message give by:     If discussed at Long Length of Stay Meetings, dates discussed:    Additional Comments:  Monica Becton, RN 11/14/2014, 12:11 PM

## 2014-11-14 NOTE — Progress Notes (Signed)
ANTIBIOTIC CONSULT NOTE - FOLLOW UP  Pharmacy Consult for Vancomycin and Zosyn Indication: Cellulitis  Allergies  Allergen Reactions  . Hydrocodone-Acetaminophen Itching    Patient Measurements: Height:  (172.7 cm) Weight: 205 lb 8 oz (93.214 kg) IBW/kg (Calculated) : 68.4 Adjusted Body Weight:   Vital Signs: Temp: 97.8 F (36.6 C) (07/27 0607) Temp Source: Oral (07/27 0607) BP: 136/68 mmHg (07/27 0607) Pulse Rate: 71 (07/27 0607) Intake/Output from previous day: 07/26 0701 - 07/27 0700 In: 1710 [P.O.:960; IV Piggyback:750] Out: -  Intake/Output from this shift:    Labs:  Recent Labs  11/13/14 0544  WBC 8.8  HGB 11.5*  PLT 346  CREATININE 0.82   Estimated Creatinine Clearance: 122 mL/min (by C-G formula based on Cr of 0.82). No results for input(s): VANCOTROUGH, VANCOPEAK, VANCORANDOM, GENTTROUGH, GENTPEAK, GENTRANDOM, TOBRATROUGH, TOBRAPEAK, TOBRARND, AMIKACINPEAK, AMIKACINTROU, AMIKACIN in the last 72 hours.   Microbiology: Recent Results (from the past 720 hour(s))  Surgical pcr screen     Status: None   Collection Time: 10/22/14 12:52 AM  Result Value Ref Range Status   MRSA, PCR NEGATIVE NEGATIVE Final   Staphylococcus aureus NEGATIVE NEGATIVE Final    Comment:        The Xpert SA Assay (FDA approved for NASAL specimens in patients over 27 years of age), is one component of a comprehensive surveillance program.  Test performance has been validated by [facility] for patients greater than or equal to 14 year old. It is not intended to diagnose infection nor to guide or monitor treatment.   Culture, blood (x 2)     Status: None   Collection Time: 10/22/14  1:28 AM  Result Value Ref Range Status   Specimen Description BLOOD LEFT ANTECUBITAL  Final   Special Requests BOTTLES DRAWN AEROBIC AND ANAEROBIC 10CC  Final   Culture NO GROWTH 5 DAYS  Final   Report Status 10/27/2014 FINAL  Final  Culture, blood (x 2)     Status: None   Collection  Time: 10/22/14  1:38 AM  Result Value Ref Range Status   Specimen Description BLOOD LEFT HAND  Final   Special Requests BOTTLES DRAWN AEROBIC ONLY 10CC  Final   Culture NO GROWTH 5 DAYS  Final   Report Status 10/27/2014 FINAL  Final  Anaerobic culture     Status: None (Preliminary result)   Collection Time: 11/12/14  2:55 PM  Result Value Ref Range Status   Specimen Description WOUND FOOT RIGHT  Final   Special Requests PT ON ZOSYN  Final   Gram Stain   Final    RARE WBC PRESENT,BOTH PMN AND MONONUCLEAR NO SQUAMOUS EPITHELIAL CELLS SEEN NO ORGANISMS SEEN Performed at [facility]    Culture   Final    NO ANAEROBES ISOLATED; CULTURE IN PROGRESS FOR 5 DAYS Performed at [facility]    Report Status PENDING  Incomplete  Wound culture     Status: None (Preliminary result)   Collection Time: 11/12/14  2:55 PM  Result Value Ref Range Status   Specimen Description WOUND FOOT RIGHT  Final   Special Requests PT ON ZOSYN  Final   Gram Stain   Final    RARE WBC PRESENT,BOTH PMN AND MONONUCLEAR NO SQUAMOUS EPITHELIAL CELLS SEEN NO ORGANISMS SEEN Performed at [facility]    Culture   Final    FEW GRAM NEGATIVE RODS Performed at [facility]    Report Status PENDING  Incomplete  Anti-infectives    Start     Dose/Rate Route Frequency Ordered Stop   11/12/14 1453  polymyxin B 500,000 Units, bacitracin 50,000 Units in sodium chloride irrigation 0.9 % 500 mL irrigation  Status:  Discontinued       As needed 11/12/14 1453 11/12/14 1527   11/08/14 0400  piperacillin-tazobactam (ZOSYN) IVPB 3.375 g     3.375 g 12.5 mL/hr over 240 Minutes Intravenous Every 8 hours 11/07/14 2351     11/08/14 0000  vancomycin (VANCOCIN) IVPB 1000 mg/200 mL premix     1,000 mg 200 mL/hr over 60 Minutes Intravenous Every 8 hours 11/07/14 2351     11/07/14 2245  vancomycin (VANCOCIN) IVPB 1000 mg/200 mL premix  Status:  Discontinued     1,000 mg 200 mL/hr over 60 Minutes  Intravenous  Once 11/07/14 2232 11/07/14 2343   11/07/14 2245  piperacillin-tazobactam (ZOSYN) IVPB 3.375 g     3.375 g 100 mL/hr over 30 Minutes Intravenous  Once 11/07/14 2232 11/07/14 2309      Assessment: 48yo male with cellulitis/possible osteomyelitis per 7/20 Xray, on Vancomycin 1000mg  IV q8 and Zosyn 3.375g IV q8.  Wound culture is (+) for a few GNR.  BMet on 7/26 with Cr < 1 and lytes wnl, CBC with WBC wnl.  Pt is afebrile with no tenderness/erythema.  I discussed the possibility of stopping Vancomycin due to (+)GNR; he has been trying to get in touch with plastic surgeon & wants to wait to contact him.  Goal of Therapy:  Vancomycin trough level 15-20 mcg/ml  Plan:  Continue current doses of antibiotics Watch renal fxn F/U plans for Vancomycin  Marisue Humble, PharmD Clinical Pharmacist [facility]

## 2014-11-14 NOTE — Progress Notes (Signed)
Family Medicine Teaching Service Daily Progress Note Intern Pager: [phone]  Patient name: [name] [name] Medical record number: [id number] Date of birth: [birth year] Age: 49 y.o. Gender: male  Primary Care Provider: No PCP Per Patient Consultants: ortho Code Status: full  Pt Overview and Major Events to Date:  7/20: patient admitted with RLE swelling. Started on vanc and zosyn  Assessment and Plan:  [name] [name] is a 49 y.o. male presenting with right foot cellulitis . PMH is significant for depression and smoking  RLE cellulitis: improving. Concerning for osteomyelitis although MRI does not suggest. Currently stable on antibiotics. S/p post irrigation, debridement and A-cell placement on 02/25. Had an episode of emesis while in OR. Denies SOB or chest pain. CXR w/o cardiopulmonary processes. A1c 5.8. Talked to his orthopedics who recommended continuing his abx, wearing CAM boot and follow up in 3 weeks. Attempt to get in touch with his plastic surgeon unsuccessful. - will attempt to touch base with plastic again - Vanc and zosyn (7/20>).Vanc trough 15 7/24 - will consider switching to PO - D/cd  Morphine - Started percocet 5/325 mg Po q4h prn pain - Tylenol 650 mg q6h scheduled - Ibuprofen 800 mg tid  Depression: on celexa 10 mg at home - continue home celexa  Smoking: 25PY. Discussed about its impact on his wound healing previously.He said he stopped cold [place] in the past for two years. He states he can do that again. He declines patch or pills for the second time. - will discuss about this again today - Nicotine patch if requested  FEN/GI: Miralax daily PRN, protonix Prophylaxis: heparin SubQ  FEN/GI: protonix  PPx: hepain Subq  Disposition: home on PO abxs pending surgery rec about wound vac.  Subjective: Saw patient in the room standing with cigarette in his hand. No smoke. Has his cycling glove on. He admits smoking this morning. Otherwise, denied SOB, CP and leg  pain. CAM boot in place. Wound vac draining about 20 mls of serosanguinous fluid.  D/cd his morphine and started on percocet.  Objective: Temp:  [97.8 F (36.6 C)-98.1 F (36.7 C)] 97.8 F (36.6 C) (07/27 0607) Pulse Rate:  [71-78] 71 (07/27 0607) Resp:  [16-18] 18 (07/27 0607) BP: (119-139)/(62-94) 136/68 mmHg (07/27 0607) SpO2:  [98 %-100 %] 100 % (07/27 1308)  Physical Exam: Gen: No acute distress. Alert, cooperative with exam CV: RRR. No murmurs Resp: CTAB. No wheezing, crackles, or rhonchi noted. Abd: +BS. Soft, non-distended, non-tender. No rebound or guarding RLE: foot wound with dressing with A-cell and pins in palce,  trace edema. No tenderness.  great toe. No erythema Neuro: Alert and oriented, No gross focal deficits  Laboratory:  Recent Labs Lab 11/07/14 2006 11/13/14 0544  WBC 8.4 8.8  HGB 12.3* 11.5*  HCT 36.9* 35.2*  PLT 339 346    Recent Labs Lab 11/07/14 2006 11/13/14 0544  NA 135 137  K 4.1 4.1  CL 101 103  CO2 24 25  BUN 16 11  CREATININE 1.06 0.82  CALCIUM 8.9 9.1  PROT 7.1  --   BILITOT 0.2*  --   ALKPHOS 61  --   ALT 17  --   AST 18  --   GLUCOSE 164* 115*    Imaging/Diagnostic Tests: Dg Chest Port 1 View  11/12/2014   CLINICAL DATA:  Evaluate for aspiration.  Postop.  EXAM: PORTABLE CHEST - 1 VIEW  COMPARISON:  None.  FINDINGS: The study is severely limited. The lung bases are not included  on this study. Aspiration pneumonia cannot be excluded. The visualized upper right mid lung zones are clear. Normal heart size. No pneumothorax. Chronic right mid posterior rib deformity.  IMPRESSION: Limited exam. Lung bases are not included. Aspiration pneumonia cannot be excluded by this examination. No visualized evidence of acute cardiopulmonary disease.   Electronically Signed   By: Jolaine Click M.D.   On: 11/12/2014 16:19    Almon Hercules, MD 11/14/2014, 9:18 AM PGY-1, [facility] FPTS Intern pager: [phone], text pages  welcome
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
## 2014-11-15 DIAGNOSIS — L97513 Non-pressure chronic ulcer of other part of right foot with necrosis of muscle: Secondary | ICD-10-CM

## 2014-11-15 DIAGNOSIS — L97509 Non-pressure chronic ulcer of other part of unspecified foot with unspecified severity: Secondary | ICD-10-CM | POA: Insufficient documentation

## 2014-11-15 DIAGNOSIS — B965 Pseudomonas (aeruginosa) (mallei) (pseudomallei) as the cause of diseases classified elsewhere: Secondary | ICD-10-CM

## 2014-11-15 DIAGNOSIS — L03115 Cellulitis of right lower limb: Principal | ICD-10-CM

## 2014-11-15 NOTE — Progress Notes (Signed)
Family Medicine Teaching Service Daily Progress Note Intern Pager: [phone]  Patient name: [name] [name] Medical record number: [id number] Date of birth: 01-04-1966 Age: 49 y.o. Gender: male  Primary Care Provider: No PCP Per Patient Consultants: ortho Code Status: full  Pt Overview and Major Events to Date:  7/20: patient admitted with RLE swelling. Started on vanc and zosyn  Assessment and Plan:  [name] [name] is a 49 y.o. male presenting with right foot cellulitis . PMH is significant for depression and smoking  RLE cellulitis: improving. Concerning for osteomyelitis although MRI does not suggest. Currently stable on antibiotics. S/p post irrigation, debridement and A-cell placement on 02/25. Had an episode of emesis while in OR. Denies SOB or chest pain. CXR w/o cardiopulmonary processes. A1c 5.8. Talked to his orthopedics who recommended continuing his abx, wearing CAM boot and follow up in 3 weeks. However, his deep wound culture grew Pseudomonas. ID consulted 7/28. - f/u ID recs - Vanc and zosyn (7/20>).Vanc trough 15 7/24 - D/cd  Morphine - Ordered HH RN and wound vac for home use upon d/c - Started percocet 5/325 mg Po q4h prn pain - Tylenol 650 mg q6h scheduled - Ibuprofen 800 mg tid  Depression: on celexa 10 mg at home - continue home celexa  Smoking: 25PY. Discussed about its impact on his wound healing previously.He said he stopped cold [place] in the past for two years. He states he can do that again. He declines patch or pills for the second time. - will discuss about this again today - Nicotine patch if requested  FEN/GI: Miralax daily PRN, protonix Prophylaxis: heparin SubQ  FEN/GI: protonix  PPx: hepain Subq  Disposition: home on wound vacc pending ID recs about pseudomonas from wound culture.  Subjective: Saw pt in the room. Wound vac in place. Endorses mild burning pain in his right leg. Denies SOB & CP.  Objective: Temp:  [98.2 F (36.8 C)-98.4 F  (36.9 C)] 98.4 F (36.9 C) (07/28 0629) Pulse Rate:  [74-76] 74 (07/28 0629) Resp:  [18] 18 (07/28 0629) BP: (134-138)/(64-69) 134/69 mmHg (07/28 0629) SpO2:  [100 %] 100 % (07/28 0629)  Physical Exam: Gen: No acute distress. Alert, cooperative with exam CV: RRR. No murmurs Resp: CTAB. No wheezing, crackles, or rhonchi noted. Abd: +BS. Soft, non-distended, non-tender. No rebound or guarding RLE: in CAM boot with underlying dressing, trace edema. No tenderness. No erythema Neuro: Alert and oriented, No gross focal deficits  Laboratory:  Recent Labs Lab 11/13/14 0544  WBC 8.8  HGB 11.5*  HCT 35.2*  PLT 346    Recent Labs Lab 11/13/14 0544  NA 137  K 4.1  CL 103  CO2 25  BUN 11  CREATININE 0.82  CALCIUM 9.1  GLUCOSE 115*    Imaging/Diagnostic Tests: No results found.  Almon Hercules, MD 11/15/2014, 7:28 AM PGY-1, [facility] FPTS Intern pager: [phone], text pages welcome

## 2014-11-15 NOTE — Care Management Note (Signed)
Case Management Note  Patient Details  Name: [name] [name] MRN: [id number] Date of Birth: Oct 20, 1965  Subjective/Objective:         Right foot cellulitis           Action/Plan: Spoke with Shawn Rayburn PA and verified that patient will need VAC at home and that Dr. Kelly Splinter will sign forms after surgery today.  Contacted Rickie with [employer] regarding patient needing home VAC. Faxed H and P,op note and signed charity care application to [phone].Contacted Miranda at [facility] to set up [facility], they will work with patient if home IV antibiotics are needed or VAC dressing changes needed. Will continue to follow.        Expected Discharge Date:                  Expected Discharge Plan:  Home w Home Health Services  In-House Referral:  Financial Counselor  Discharge planning Services  CM Consult  Post Acute Care Choice:  Durable Medical Equipment, Home Health Choice offered to:  Patient  DME Arranged:  Vac DME Agency:  [employer]  HH Arranged:  RN HH Agency:  [facility]  Status of Service:  In process, will continue to follow  Medicare Important Message Given:    Date Medicare IM Given:    Medicare IM give by:    Date Additional Medicare IM Given:    Additional Medicare Important Message give by:     If discussed at Long Length of Stay Meetings, dates discussed:    Additional Comments:  Monica Becton, RN 11/15/2014, 1:35 PM
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
## 2014-11-16 LAB — CBC
HCT: 33.2 % — ABNORMAL LOW (ref 39.0–52.0)
HEMOGLOBIN: 11.2 g/dL — AB (ref 13.0–17.0)
MCH: 30.9 pg (ref 26.0–34.0)
MCHC: 33.7 g/dL (ref 30.0–36.0)
MCV: 91.5 fL (ref 78.0–100.0)
Platelets: 322 10*3/uL (ref 150–400)
RBC: 3.63 MIL/uL — AB (ref 4.22–5.81)
RDW: 13.3 % (ref 11.5–15.5)
WBC: 11.1 10*3/uL — ABNORMAL HIGH (ref 4.0–10.5)

## 2014-11-16 LAB — WOUND CULTURE

## 2014-11-16 LAB — BASIC METABOLIC PANEL
ANION GAP: 7 (ref 5–15)
BUN: 16 mg/dL (ref 6–20)
CHLORIDE: 104 mmol/L (ref 101–111)
CO2: 27 mmol/L (ref 22–32)
Calcium: 9.4 mg/dL (ref 8.9–10.3)
Creatinine, Ser: 1.06 mg/dL (ref 0.61–1.24)
GFR calc non Af Amer: >60 mL/min (ref >60)
GLUCOSE: 109 mg/dL — AB (ref 65–99)
POTASSIUM: 4.2 mmol/L (ref 3.5–5.1)
SODIUM: 138 mmol/L (ref 135–145)

## 2014-11-16 MED ORDER — PIPERACILLIN-TAZOBACTAM 3.375 G IVPB: 3.3750 g | Freq: Three times a day (TID) | INTRAVENOUS | Status: DC

## 2014-11-16 MED ORDER — POLYETHYLENE GLYCOL 3350 17 G PO PACK: 17.0000 g | PACK | Freq: Every day | ORAL | Status: DC | PRN

## 2014-11-16 MED ORDER — ENSURE ENLIVE PO LIQD: 237.0000 mL | Freq: Two times a day (BID) | ORAL | Status: DC

## 2014-11-16 MED ORDER — MORPHINE SULFATE 4 MG/ML IJ SOLN: 4.0000 mg | Freq: Once | INTRAMUSCULAR | Status: DC

## 2014-11-16 MED ORDER — SODIUM CHLORIDE 0.9 % IJ SOLN: 10.0000 mL | INTRAMUSCULAR | Status: DC | PRN

## 2014-11-16 NOTE — Progress Notes (Signed)
Received call from [place] at [facility], they will be delivering a home VAC to the patient's room this afternoon,should be before 6pm. Made patient an appointment for follow up with [facility] on 11/23/14 at 11:00am. Patient is set up with [facility] for IV antibiotics,RN and social work visits. Patient has an appointment at wound clinic with Dr. Kelly Splinter on 11/19/14 to have wound vac dressing change. Patient has been made aware of all appointments.

## 2014-11-16 NOTE — Progress Notes (Signed)
[facility]  Patient Status: New pt for [facility] this admission  [facility] is providing the following services: HHRN and home infusion pharmacy for home IV ABX.    If patient discharges after hours, please call [phone].   Sedalia Muta 11/16/2014, 4:34 PM

## 2014-11-16 NOTE — Progress Notes (Signed)
Family Medicine Teaching Service Daily Progress Note Intern Pager: [phone]  Patient name: [name] [name] Medical record number: [id number] Date of birth: 06/07/1965 Age: 49 y.o. Gender: male  Primary Care Provider: No PCP Per Patient Consultants: ortho Code Status: full  Pt Overview and Major Events to Date:  7/20: patient admitted with RLE swelling. Started on vanc and zosyn  Assessment and Plan:  Rafeal Skibicki is a 49 y.o. male presenting with right foot cellulitis . PMH is significant for depression and smoking  RLE cellulitis: improving. Concerning for osteomyelitis although MRI does not suggest. Currently stable on antibiotics. S/p post irrigation, debridement and A-cell placement on 02/25. Had an episode of emesis while in OR. Denies SOB or chest pain. CXR w/o cardiopulmonary processes. A1c 5.8. Talked to his orthopedics who recommended continuing his abx, wearing CAM boot and follow up in 3 weeks. However, his deep wound culture grew Pseudomonas. - ID consulted and app recs:  -zosyn 3.375 gm tid to be stopped on 12/06/2014  -PICC line for home abx - Vanc and zosyn (7/20>).Vanc trough 15 7/24 - D/cd  Morphine - Ordered HH RN and wound vac for home use upon d/c - Started percocet 5/325 mg Po q4h prn pain - Tylenol 650 mg q6h scheduled - Ibuprofen 800 mg tid  Depression: on celexa 10 mg at home - continue home celexa  Smoking: 25PY. Discussed about its impact on his wound healing previously.He said he stopped cold [place] in the past for two years. He states he can do that again. He declines patch or pills for the second time. - will discuss about this again today - Nicotine patch if requested  FEN/GI: Miralax daily PRN, protonix Prophylaxis: heparin SubQ  FEN/GI: protonix  PPx: hepain Subq  Disposition: home on wound vacc and zosyn 11/16/2014.  Subjective: Saw pt in the room. Wound vac in place. Endorses mild burning pain in his right leg. Denies SOB &  CP.  Objective: Temp:  [98 F (36.7 C)-98.4 F (36.9 C)] 98.1 F (36.7 C) (07/29 0513) Pulse Rate:  [69-76] 76 (07/29 0513) Resp:  [18-20] 20 (07/29 0513) BP: (131-140)/(71-86) 131/71 mmHg (07/29 0513) SpO2:  [93 %-100 %] 100 % (07/29 0513)  Physical Exam: Gen: No acute distress. Alert, cooperative with exam CV: RRR. No murmurs Resp: CTAB. No wheezing, crackles, or rhonchi noted. Abd: +BS. Soft, non-distended, non-tender. No rebound or guarding RLE: in CAM boot with underlying dressing, trace edema. No tenderness. No erythema Neuro: Alert and oriented, No gross focal deficits  Laboratory:  Recent Labs Lab 11/13/14 0544 11/16/14 0422  WBC 8.8 11.1*  HGB 11.5* 11.2*  HCT 35.2* 33.2*  PLT 346 322    Recent Labs Lab 11/13/14 0544 11/16/14 0422  NA 137 138  K 4.1 4.2  CL 103 104  CO2 25 27  BUN 11 16  CREATININE 0.82 1.06  CALCIUM 9.1 9.4  GLUCOSE 115* 109*    Imaging/Diagnostic Tests: No results found.  Almon Hercules, MD 11/16/2014, 5:47 PM PGY-1, [facility] FPTS Intern pager: [phone], text pages welcome

## 2014-11-16 NOTE — Progress Notes (Signed)
Peripherally Inserted Central Catheter/Midline Placement  The IV Nurse has discussed with the patient and/or persons authorized to consent for the patient, the purpose of this procedure and the potential benefits and risks involved with this procedure.  The benefits include less needle sticks, lab draws from the catheter and patient may be discharged home with the catheter.  Risks include, but not limited to, infection, bleeding, blood clot (thrombus formation), and puncture of an artery; nerve damage and irregular heat beat.  Alternatives to this procedure were also discussed.  PICC/Midline Placement Documentation        [name] [name] 11/16/2014, 5:14 PM

## 2014-11-16 NOTE — Progress Notes (Signed)
Arrived at patient's room to insert PICC however he was not there.   Was informed he was out walking outside.   Primary RN made aware we could not wait for him.    I talked with RN this morning who informed the pt he needed to stay on the unit because we could be coming to insert his PICC so he could be discharged.

## 2014-11-17 LAB — ANAEROBIC CULTURE

## 2014-11-18 ENCOUNTER — Telehealth: Payer: Self-pay | Admitting: Family Medicine
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
## 2014-11-19 ENCOUNTER — Encounter (HOSPITAL_BASED_OUTPATIENT_CLINIC_OR_DEPARTMENT_OTHER): Payer: Medicaid Other | Attending: Plastic Surgery

## 2014-11-19 ENCOUNTER — Telehealth: Payer: Self-pay | Admitting: Family Medicine

## 2014-11-19 ENCOUNTER — Telehealth: Payer: Self-pay | Admitting: *Deleted

## 2014-11-19 ENCOUNTER — Telehealth: Payer: Self-pay | Admitting: Internal Medicine

## 2014-11-19 DIAGNOSIS — L97512 Non-pressure chronic ulcer of other part of right foot with fat layer exposed: Secondary | ICD-10-CM | POA: Insufficient documentation

## 2014-11-19 DIAGNOSIS — L03115 Cellulitis of right lower limb: Secondary | ICD-10-CM | POA: Diagnosis not present

## 2014-11-19 DIAGNOSIS — S9781XD Crushing injury of right foot, subsequent encounter: Secondary | ICD-10-CM | POA: Diagnosis not present

## 2014-11-19 NOTE — Telephone Encounter (Signed)
Called by [facility] regarding [name].  He was showing inappropriate behavior with the home health nurse there and due to safety concerns was discharged from home health.  I have asked for PICC to be removed either by police escort with home health or to ED or our clinic for removal.  I have prescribed cipro which was intermediate in sensitivities as the only option since IV is not an option.   Staci Righter, MD
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
## 2014-11-19 NOTE — Telephone Encounter (Signed)
Have the police bring him in for picc removal or to ED.  He was sent by [facility] an oral prescription for cipro.  thanks

## 2014-11-19 NOTE — Telephone Encounter (Signed)
[facility] called to let facility know that they are discharging patient due to non compliance and safety as well as behavioral outbursts.

## 2014-11-20 NOTE — Telephone Encounter (Signed)
Left message on patient's phone to confirm that he is taking the oral prescription and to set up a time/way for him to have the PICC removed.  Confirmed with Coretta at [facility] pharmacy that the patient has been discharged from them, [facility] nursing will not be able to d/c the PICC.  Patient has a supply of IV antibiotics through 8/4 per Coretta.

## 2014-11-23 ENCOUNTER — Inpatient Hospital Stay: Payer: Medicaid Other | Admitting: Family Medicine

## 2014-11-26 ENCOUNTER — Encounter: Payer: Self-pay | Admitting: Internal Medicine

## 2014-11-26 ENCOUNTER — Encounter (HOSPITAL_BASED_OUTPATIENT_CLINIC_OR_DEPARTMENT_OTHER): Payer: No Typology Code available for payment source

## 2014-11-26 ENCOUNTER — Ambulatory Visit (INDEPENDENT_AMBULATORY_CARE_PROVIDER_SITE_OTHER): Payer: Medicaid Other | Admitting: Internal Medicine

## 2014-11-26 VITALS — BP 154/89 | HR 75 | Temp 98.0°F | Ht 68.0 in | Wt 205.0 lb

## 2014-11-26 DIAGNOSIS — L97512 Non-pressure chronic ulcer of other part of right foot with fat layer exposed: Secondary | ICD-10-CM | POA: Diagnosis not present

## 2014-11-26 MED ORDER — CIPROFLOXACIN HCL 500 MG PO TABS
500.0000 mg | ORAL_TABLET | Freq: Two times a day (BID) | ORAL | Status: AC
Start: 2014-11-26 — End: 2014-12-24
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
## 2014-11-26 NOTE — Assessment & Plan Note (Signed)
Deep wound infection.  Did get some IV therapy though unable to continue without home health. I did pull picc line today and he will use cipro, though intermediate for one month and stop.  Will return as needed or if new concerns.  Redressed wound.

## 2014-11-27 DIAGNOSIS — S9781XD Crushing injury of right foot, subsequent encounter: Secondary | ICD-10-CM | POA: Diagnosis not present

## 2014-11-27 DIAGNOSIS — L03115 Cellulitis of right lower limb: Secondary | ICD-10-CM | POA: Diagnosis not present

## 2014-11-27 DIAGNOSIS — L97512 Non-pressure chronic ulcer of other part of right foot with fat layer exposed: Secondary | ICD-10-CM | POA: Diagnosis not present

## 2014-12-03 DIAGNOSIS — S9781XD Crushing injury of right foot, subsequent encounter: Secondary | ICD-10-CM | POA: Diagnosis not present

## 2014-12-03 DIAGNOSIS — L03115 Cellulitis of right lower limb: Secondary | ICD-10-CM | POA: Diagnosis not present

## 2014-12-03 DIAGNOSIS — L97512 Non-pressure chronic ulcer of other part of right foot with fat layer exposed: Secondary | ICD-10-CM | POA: Diagnosis not present

## 2014-12-10 DIAGNOSIS — L97512 Non-pressure chronic ulcer of other part of right foot with fat layer exposed: Secondary | ICD-10-CM | POA: Diagnosis not present

## 2014-12-10 DIAGNOSIS — S9781XD Crushing injury of right foot, subsequent encounter: Secondary | ICD-10-CM | POA: Diagnosis not present

## 2014-12-10 DIAGNOSIS — L03115 Cellulitis of right lower limb: Secondary | ICD-10-CM | POA: Diagnosis not present

## 2014-12-11 ENCOUNTER — Other Ambulatory Visit: Payer: Self-pay | Admitting: Plastic Surgery

## 2014-12-11 ENCOUNTER — Encounter (HOSPITAL_COMMUNITY): Payer: Self-pay | Admitting: *Deleted

## 2014-12-11 DIAGNOSIS — L97519 Non-pressure chronic ulcer of other part of right foot with unspecified severity: Secondary | ICD-10-CM

## 2014-12-12 ENCOUNTER — Encounter (HOSPITAL_COMMUNITY): Payer: Self-pay | Admitting: Anesthesiology

## 2014-12-12 ENCOUNTER — Encounter (HOSPITAL_COMMUNITY)
Admission: RE | Disposition: A | Discharge status: Home / Self Care | Payer: Self-pay | Source: Ambulatory Visit | Attending: Plastic Surgery

## 2014-12-12 ENCOUNTER — Ambulatory Visit (HOSPITAL_COMMUNITY): Payer: Medicaid Other | Admitting: Anesthesiology

## 2014-12-12 ENCOUNTER — Ambulatory Visit (HOSPITAL_COMMUNITY)
Admission: RE | Admit: 2014-12-12 | Discharge: 2014-12-12 | Disposition: A | Discharge status: Home / Self Care | Payer: Medicaid Other | Source: Ambulatory Visit | Attending: Plastic Surgery | Admitting: Plastic Surgery

## 2014-12-12 DIAGNOSIS — Z792 Long term (current) use of antibiotics: Secondary | ICD-10-CM | POA: Insufficient documentation

## 2014-12-12 DIAGNOSIS — S91301A Unspecified open wound, right foot, initial encounter: Secondary | ICD-10-CM | POA: Insufficient documentation

## 2014-12-12 DIAGNOSIS — S9781XA Crushing injury of right foot, initial encounter: Secondary | ICD-10-CM | POA: Diagnosis not present

## 2014-12-12 DIAGNOSIS — F17210 Nicotine dependence, cigarettes, uncomplicated: Secondary | ICD-10-CM | POA: Insufficient documentation

## 2014-12-12 DIAGNOSIS — L97519 Non-pressure chronic ulcer of other part of right foot with unspecified severity: Secondary | ICD-10-CM | POA: Diagnosis present

## 2014-12-12 HISTORY — PX: APPLICATION OF A-CELL OF EXTREMITY: SHX6303

## 2014-12-12 HISTORY — PX: I&D EXTREMITY: SHX5045

## 2014-12-12 LAB — CBC
HEMATOCRIT: 41.7 % (ref 39.0–52.0)
Hemoglobin: 14.4 g/dL (ref 13.0–17.0)
MCH: 30.8 pg (ref 26.0–34.0)
MCHC: 34.5 g/dL (ref 30.0–36.0)
MCV: 89.3 fL (ref 78.0–100.0)
PLATELETS: 293 10*3/uL (ref 150–400)
RBC: 4.67 MIL/uL (ref 4.22–5.81)
RDW: 13.5 % (ref 11.5–15.5)
WBC: 7 10*3/uL (ref 4.0–10.5)

## 2014-12-12 SURGERY — IRRIGATION AND DEBRIDEMENT EXTREMITY
Anesthesia: General | Site: Foot | Laterality: Right

## 2014-12-12 MED ORDER — CIPROFLOXACIN IN D5W 400 MG/200ML IV SOLN
INTRAVENOUS | Status: DC | PRN
  Administered 2014-12-12: 400 mg via INTRAVENOUS

## 2014-12-12 MED ORDER — ONDANSETRON HCL 4 MG/2ML IJ SOLN
INTRAMUSCULAR | Status: AC
  Filled 2014-12-12: qty 2

## 2014-12-12 MED ORDER — SODIUM CHLORIDE 0.9 % IR SOLN
Status: DC | PRN
  Administered 2014-12-12: 500 mL

## 2014-12-12 MED ORDER — FENTANYL CITRATE (PF) 100 MCG/2ML IJ SOLN
INTRAMUSCULAR | Status: DC | PRN
  Administered 2014-12-12 (×5): 50 ug via INTRAVENOUS

## 2014-12-12 MED ORDER — SODIUM CHLORIDE 0.9 % IR SOLN
Status: DC | PRN
  Administered 2014-12-12: 2000 mL

## 2014-12-12 MED ORDER — MIDAZOLAM HCL 5 MG/5ML IJ SOLN
INTRAMUSCULAR | Status: DC | PRN
  Administered 2014-12-12: 2 mg via INTRAVENOUS

## 2014-12-12 MED ORDER — MIDAZOLAM HCL 2 MG/2ML IJ SOLN
INTRAMUSCULAR | Status: AC
  Filled 2014-12-12: qty 4

## 2014-12-12 MED ORDER — DEXAMETHASONE SODIUM PHOSPHATE 4 MG/ML IJ SOLN
INTRAMUSCULAR | Status: AC
  Filled 2014-12-12: qty 2

## 2014-12-12 MED ORDER — PROPOFOL 10 MG/ML IV BOLUS
INTRAVENOUS | Status: DC | PRN
  Administered 2014-12-12: 200 mg via INTRAVENOUS

## 2014-12-12 MED ORDER — CEFAZOLIN SODIUM-DEXTROSE 2-3 GM-% IV SOLR
2.0000 g | INTRAVENOUS | Status: AC
  Administered 2014-12-12: 2 g via INTRAVENOUS
  Filled 2014-12-12: qty 50

## 2014-12-12 MED ORDER — CIPROFLOXACIN IN D5W 400 MG/200ML IV SOLN
400.0000 mg | INTRAVENOUS | Status: DC
  Filled 2014-12-12: qty 200

## 2014-12-12 MED ORDER — FENTANYL CITRATE (PF) 250 MCG/5ML IJ SOLN
INTRAMUSCULAR | Status: AC
  Filled 2014-12-12: qty 5

## 2014-12-12 MED ORDER — LIDOCAINE HCL (CARDIAC) 20 MG/ML IV SOLN
INTRAVENOUS | Status: DC | PRN
  Administered 2014-12-12: 80 mg via INTRAVENOUS

## 2014-12-12 MED ORDER — CEFAZOLIN SODIUM-DEXTROSE 2-3 GM-% IV SOLR
INTRAVENOUS | Status: AC
  Filled 2014-12-12: qty 50

## 2014-12-12 MED ORDER — LIDOCAINE HCL (CARDIAC) 20 MG/ML IV SOLN
INTRAVENOUS | Status: AC
  Filled 2014-12-12: qty 5

## 2014-12-12 MED ORDER — ONDANSETRON HCL 4 MG/2ML IJ SOLN
INTRAMUSCULAR | Status: DC | PRN
  Administered 2014-12-12: 4 mg via INTRAVENOUS

## 2014-12-12 MED ORDER — LACTATED RINGERS IV SOLN
INTRAVENOUS | Status: DC
  Administered 2014-12-12: 14:00:00 via INTRAVENOUS

## 2014-12-12 MED ORDER — DEXAMETHASONE SODIUM PHOSPHATE 10 MG/ML IJ SOLN
INTRAMUSCULAR | Status: DC | PRN
  Administered 2014-12-12: 4 mg via INTRAVENOUS

## 2014-12-12 MED ORDER — DEXAMETHASONE SODIUM PHOSPHATE 4 MG/ML IJ SOLN
INTRAMUSCULAR | Status: AC
  Filled 2014-12-12: qty 1

## 2014-12-12 MED ORDER — PROPOFOL 10 MG/ML IV BOLUS
INTRAVENOUS | Status: AC
  Filled 2014-12-12: qty 20

## 2014-12-12 SURGICAL SUPPLY — 58 items
BANDAGE ELASTIC 4 VELCRO ST LF (GAUZE/BANDAGES/DRESSINGS) ×2 IMPLANT
BLADE SURG ROTATE 9660 (MISCELLANEOUS) IMPLANT
BNDG GAUZE ELAST 4 BULKY (GAUZE/BANDAGES/DRESSINGS) ×4 IMPLANT
CANISTER SUCTION 2500CC (MISCELLANEOUS) ×3 IMPLANT
CHLORAPREP W/TINT 26ML (MISCELLANEOUS) IMPLANT
COVER SURGICAL LIGHT HANDLE (MISCELLANEOUS) ×3 IMPLANT
DRAPE EXTREMITY T 121X128X90 (DRAPE) IMPLANT
DRAPE ORTHO SPLIT 77X108 STRL (DRAPES) ×6
DRAPE SURG ORHT 6 SPLT 77X108 (DRAPES) IMPLANT
DRSG ADAPTIC 3X8 NADH LF (GAUZE/BANDAGES/DRESSINGS) ×2 IMPLANT
DRSG PAD ABDOMINAL 8X10 ST (GAUZE/BANDAGES/DRESSINGS) IMPLANT
DRSG VAC ATS LRG SENSATRAC (GAUZE/BANDAGES/DRESSINGS) IMPLANT
DRSG VAC ATS MED SENSATRAC (GAUZE/BANDAGES/DRESSINGS) IMPLANT
DRSG VAC ATS SM SENSATRAC (GAUZE/BANDAGES/DRESSINGS) IMPLANT
ELECT CAUTERY BLADE 6.4 (BLADE) ×3 IMPLANT
ELECT REM PT RETURN 9FT ADLT (ELECTROSURGICAL) ×3
ELECTRODE REM PT RTRN 9FT ADLT (ELECTROSURGICAL) ×1 IMPLANT
GAUZE SPONGE 4X4 12PLY STRL (GAUZE/BANDAGES/DRESSINGS) IMPLANT
GLOVE BIO SURGEON STRL SZ 6.5 (GLOVE) ×4 IMPLANT
GLOVE BIO SURGEON STRL SZ7 (GLOVE) ×2 IMPLANT
GLOVE BIO SURGEONS STRL SZ 6.5 (GLOVE) ×2
GLOVE BIOGEL PI IND STRL 6.5 (GLOVE) IMPLANT
GLOVE BIOGEL PI INDICATOR 6.5 (GLOVE) ×4
GLOVE ECLIPSE 6.5 STRL STRAW (GLOVE) ×2 IMPLANT
GLOVE SURG SS PI 7.0 STRL IVOR (GLOVE) ×2 IMPLANT
GOWN STRL REUS W/ TWL LRG LVL3 (GOWN DISPOSABLE) ×3 IMPLANT
GOWN STRL REUS W/ TWL XL LVL3 (GOWN DISPOSABLE) IMPLANT
GOWN STRL REUS W/TWL LRG LVL3 (GOWN DISPOSABLE) ×9
GOWN STRL REUS W/TWL XL LVL3 (GOWN DISPOSABLE) ×3
HANDPIECE INTERPULSE COAX TIP (DISPOSABLE)
KIT BASIN OR (CUSTOM PROCEDURE TRAY) ×3 IMPLANT
KIT ROOM TURNOVER OR (KITS) ×3 IMPLANT
MATRIX SURGICAL PSM 7X10CM (Tissue) ×2 IMPLANT
MICROMATRIX 1000MG (Tissue) ×3 IMPLANT
NS IRRIG 1000ML POUR BTL (IV SOLUTION) ×5 IMPLANT
PACK GENERAL/GYN (CUSTOM PROCEDURE TRAY) ×3 IMPLANT
PACK ORTHO EXTREMITY (CUSTOM PROCEDURE TRAY) ×1 IMPLANT
PAD ABD 8X10 STRL (GAUZE/BANDAGES/DRESSINGS) IMPLANT
PAD ARMBOARD 7.5X6 YLW CONV (MISCELLANEOUS) ×6 IMPLANT
PAD NEG PRESSURE SENSATRAC (MISCELLANEOUS) IMPLANT
SET HNDPC FAN SPRY TIP SCT (DISPOSABLE) IMPLANT
SOLUTION PARTIC MCRMTRX 1000MG (Tissue) IMPLANT
SPONGE GAUZE 4X4 12PLY STER LF (GAUZE/BANDAGES/DRESSINGS) ×2 IMPLANT
STOCKINETTE IMPERVIOUS 9X36 MD (GAUZE/BANDAGES/DRESSINGS) IMPLANT
STOCKINETTE IMPERVIOUS LG (DRAPES) IMPLANT
SUT MON AB 5-0 PS2 18 (SUTURE) ×2 IMPLANT
SUT VIC AB 5-0 P-3 18XBRD (SUTURE) IMPLANT
SUT VIC AB 5-0 P3 18 (SUTURE) ×6
SUT VIC AB 5-0 PS2 18 (SUTURE) ×2 IMPLANT
SWAB COLLECTION DEVICE MRSA (MISCELLANEOUS) ×2 IMPLANT
TOWEL OR 17X24 6PK STRL BLUE (TOWEL DISPOSABLE) ×3 IMPLANT
TOWEL OR 17X26 10 PK STRL BLUE (TOWEL DISPOSABLE) ×3 IMPLANT
TUBE ANAEROBIC SPECIMEN COL (MISCELLANEOUS) ×2 IMPLANT
TUBE CONNECTING 12'X1/4 (SUCTIONS) ×1
TUBE CONNECTING 12X1/4 (SUCTIONS) ×2 IMPLANT
UNDERPAD 30X30 INCONTINENT (UNDERPADS AND DIAPERS) ×3 IMPLANT
WATER STERILE IRR 1000ML POUR (IV SOLUTION) IMPLANT
YANKAUER SUCT BULB TIP NO VENT (SUCTIONS) ×3 IMPLANT
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
## 2014-12-12 NOTE — Progress Notes (Signed)
Prior to IV insertion, a soft swollen area noticed on left wrist, near radial pulse.  IV inserted to left wrist and swollen area unchanged.

## 2014-12-12 NOTE — Discharge Instructions (Signed)
Apply KY gel to the right foot daily starting on Friday Keep foot elevated

## 2014-12-12 NOTE — Progress Notes (Signed)
No one present in room with patient prior to surgery.  Patient states that mother will be picking him up after procedure.  Patient educated on need from someone to be with him for 24 hours post anesthesia and patient verbalized understanding.  Mother's contact listed in chart.

## 2014-12-12 NOTE — Anesthesia Postprocedure Evaluation (Signed)
  Anesthesia Post-op Note  Patient: [name] [name]  Procedure(s) Performed: Procedure(s): IRRIGATION AND DEBRIDEMENT OF RIGHT FOOT WOUND  (Right) PLACEMENT OF A-CELL  (Right)  Patient Location: PACU  Anesthesia Type:General  Level of Consciousness: awake, alert  and oriented  Airway and Oxygen Therapy: Patient Spontanous Breathing  Post-op Pain: mild  Post-op Assessment: Post-op Vital signs reviewed and Patient's Cardiovascular Status Stable              Post-op Vital Signs: Reviewed and stable  Last Vitals:  Filed Vitals:   12/12/14 1333  BP: 124/89  Pulse: 90  Temp: 37.2 C  Resp: 18    Complications: No apparent anesthesia complications

## 2014-12-12 NOTE — Transfer of Care (Signed)
Immediate Anesthesia Transfer of Care Note  Patient: [name] [name]  Procedure(s) Performed: Procedure(s): IRRIGATION AND DEBRIDEMENT OF RIGHT FOOT WOUND  (Right) PLACEMENT OF A-CELL  (Right)  Patient Location: PACU  Anesthesia Type:General  Level of Consciousness: awake, alert  and oriented  Airway & Oxygen Therapy: Patient Spontanous Breathing and Patient connected to nasal cannula oxygen  Post-op Assessment: Report given to RN  Post vital signs: Reviewed  Last Vitals:  Filed Vitals:   12/12/14 1710  BP: 119/71  Pulse: 74  Temp:   Resp:     Complications: No apparent anesthesia complications

## 2014-12-12 NOTE — Interval H&P Note (Signed)
History and Physical Interval Note:  12/12/2014 7:39 AM  [name] [name]  has presented today for surgery, with the diagnosis of CHRONIC RIGHT FOOT ULCER   The various methods of treatment have been discussed with the patient and family. After consideration of risks, benefits and other options for treatment, the patient has consented to  Procedure(s): IRRIGATION AND DEBRIDEMENT OF RIGHT FOOT WOUND  (Right) PLACEMENT OF A-CELL  (Right) as a surgical intervention .  The patient's history has been reviewed, patient examined, no change in status, stable for surgery.  I have reviewed the patient's chart and labs.  Questions were answered to the patient's satisfaction.     SANGER,Aadil Sur

## 2014-12-12 NOTE — Brief Op Note (Addendum)
12/12/2014  2:36 PM  PATIENT:  [name] [name]  49 y.o. male  PRE-OPERATIVE DIAGNOSIS:  CHRONIC RIGHT FOOT ULCER   POST-OPERATIVE DIAGNOSIS:  CHRONIC RIGHT FOOT ULCER   PROCEDURE:  Procedure(s): IRRIGATION AND DEBRIDEMENT OF RIGHT FOOT WOUND  (Right) PLACEMENT OF A-CELL  (Right)  SURGEON:  Surgeon(s) and Role:    * Claire [facility], DO - Primary  PHYSICIAN ASSISTANT: Shawn Rayburn, PA  ASSISTANTS: none   ANESTHESIA:   general  EBL:     BLOOD ADMINISTERED:none  DRAINS: none   LOCAL MEDICATIONS USED:  NONE  SPECIMEN:  Right foot tissue  DISPOSITION OF SPECIMEN:  micro  COUNTS:  YES  TOURNIQUET:  * No tourniquets in log *  DICTATION: .Dragon Dictation  PLAN OF CARE: Discharge to home after PACU  PATIENT DISPOSITION:  PACU - hemodynamically stable.   Delay start of Pharmacological VTE agent (>24hrs) due to surgical blood loss or risk of bleeding: no

## 2014-12-12 NOTE — Anesthesia Procedure Notes (Signed)
Procedure Name: LMA Insertion Date/Time: 12/12/2014 2:43 PM Performed by: Jhonnie Garner Pre-anesthesia Checklist: Patient identified, Emergency Drugs available, Suction available and Patient being monitored Patient Re-evaluated:Patient Re-evaluated prior to inductionOxygen Delivery Method: Circle system utilized Preoxygenation: Pre-oxygenation with 100% oxygen Intubation Type: IV induction Ventilation: Mask ventilation without difficulty LMA: LMA inserted LMA Size: 4.0 Number of attempts: 1 Tube secured with: Tape Dental Injury: Teeth and Oropharynx as per pre-operative assessment

## 2014-12-12 NOTE — Progress Notes (Signed)
Epic Down while pt in Phase 2 . Paper charting OR and PACU. No pain meds needed, dressing dry

## 2014-12-12 NOTE — Anesthesia Preprocedure Evaluation (Addendum)
Anesthesia Evaluation  Patient identified by MRN, date of birth, ID band Patient awake    Reviewed: Allergy & Precautions, NPO status , Patient's Chart, lab work & pertinent test results  History of Anesthesia Complications Negative for: history of anesthetic complications  Airway Mallampati: II  TM Distance: >3 FB Neck ROM: Full    Dental  (+) Teeth Intact   Pulmonary Current Smoker,  breath sounds clear to auscultation        Cardiovascular negative cardio ROS  Rhythm:Regular Rate:Normal     Neuro/Psych PSYCHIATRIC DISORDERS Anxiety Depression negative neurological ROS     GI/Hepatic negative GI ROS, Neg liver ROS,   Endo/Other  negative endocrine ROS  Renal/GU negative Renal ROS  negative genitourinary   Musculoskeletal negative musculoskeletal ROS (+)   Abdominal   Peds negative pediatric ROS (+)  Hematology negative hematology ROS (+)   Anesthesia Other Findings   Reproductive/Obstetrics negative OB ROS                           Anesthesia Physical Anesthesia Plan  ASA: II  Anesthesia Plan: General   Post-op Pain Management:    Induction: Intravenous  Airway Management Planned: LMA  Additional Equipment:   Intra-op Plan:   Post-operative Plan: Extubation in OR  Informed Consent: I have reviewed the patients History and Physical, chart, labs and discussed the procedure including the risks, benefits and alternatives for the proposed anesthesia with the patient or authorized representative who has indicated his/her understanding and acceptance.   Dental advisory given  Plan Discussed with: CRNA  Anesthesia Plan Comments:         Anesthesia Quick Evaluation
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
## 2014-12-13 ENCOUNTER — Encounter (HOSPITAL_COMMUNITY): Payer: Self-pay | Admitting: Plastic Surgery

## 2014-12-16 LAB — WOUND CULTURE

## 2014-12-17 LAB — ANAEROBIC CULTURE

## 2014-12-28 ENCOUNTER — Encounter (HOSPITAL_COMMUNITY): Payer: Self-pay | Admitting: Emergency Medicine

## 2014-12-28 ENCOUNTER — Emergency Department (HOSPITAL_COMMUNITY)
Admission: EM | Admit: 2014-12-28 | Discharge: 2014-12-28 | Discharge status: Against Medical Advice | Payer: Medicaid Other | Attending: Emergency Medicine | Admitting: Emergency Medicine

## 2014-12-28 ENCOUNTER — Emergency Department (HOSPITAL_COMMUNITY)
Admission: EM | Admit: 2014-12-28 | Discharge: 2014-12-28 | Disposition: A | Discharge status: Home / Self Care | Payer: Medicaid Other | Attending: Emergency Medicine | Admitting: Emergency Medicine

## 2014-12-28 ENCOUNTER — Emergency Department (HOSPITAL_COMMUNITY): Payer: Medicaid Other

## 2014-12-28 ENCOUNTER — Encounter (HOSPITAL_COMMUNITY): Payer: Self-pay

## 2014-12-28 DIAGNOSIS — Y9389 Activity, other specified: Secondary | ICD-10-CM | POA: Diagnosis not present

## 2014-12-28 DIAGNOSIS — Z79899 Other long term (current) drug therapy: Secondary | ICD-10-CM | POA: Diagnosis not present

## 2014-12-28 DIAGNOSIS — X58XXXA Exposure to other specified factors, initial encounter: Secondary | ICD-10-CM | POA: Insufficient documentation

## 2014-12-28 DIAGNOSIS — Z872 Personal history of diseases of the skin and subcutaneous tissue: Secondary | ICD-10-CM | POA: Insufficient documentation

## 2014-12-28 DIAGNOSIS — Y9289 Other specified places as the place of occurrence of the external cause: Secondary | ICD-10-CM | POA: Insufficient documentation

## 2014-12-28 DIAGNOSIS — S91301A Unspecified open wound, right foot, initial encounter: Secondary | ICD-10-CM | POA: Insufficient documentation

## 2014-12-28 DIAGNOSIS — Y998 Other external cause status: Secondary | ICD-10-CM | POA: Insufficient documentation

## 2014-12-28 DIAGNOSIS — M79673 Pain in unspecified foot: Secondary | ICD-10-CM | POA: Diagnosis not present

## 2014-12-28 DIAGNOSIS — F329 Major depressive disorder, single episode, unspecified: Secondary | ICD-10-CM | POA: Insufficient documentation

## 2014-12-28 DIAGNOSIS — L089 Local infection of the skin and subcutaneous tissue, unspecified: Secondary | ICD-10-CM | POA: Insufficient documentation

## 2014-12-28 DIAGNOSIS — Z8781 Personal history of (healed) traumatic fracture: Secondary | ICD-10-CM | POA: Diagnosis not present

## 2014-12-28 DIAGNOSIS — Z720 Tobacco use: Secondary | ICD-10-CM | POA: Diagnosis not present

## 2014-12-28 DIAGNOSIS — M79671 Pain in right foot: Secondary | ICD-10-CM

## 2014-12-28 DIAGNOSIS — S99921A Unspecified injury of right foot, initial encounter: Secondary | ICD-10-CM | POA: Insufficient documentation

## 2014-12-28 MED ORDER — IBUPROFEN 200 MG PO TABS
600.0000 mg | ORAL_TABLET | Freq: Once | ORAL | Status: AC
  Administered 2014-12-28: 600 mg via ORAL
  Filled 2014-12-28 (×2): qty 1

## 2014-12-28 MED ORDER — IBUPROFEN 600 MG PO TABS: 600.0000 mg | ORAL_TABLET | Freq: Four times a day (QID) | ORAL | Status: AC | PRN

## 2014-12-28 NOTE — Discharge Instructions (Signed)
You were evaluated today for right foot pain. You have extensive injury to right foot. There is no signs of obvious infection. You need to follow-up closely with your surgeon and wound care clinic. If you develop fevers or any new or worsening symptoms you should be reevaluated.
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
## 2014-12-28 NOTE — ED Notes (Signed)
Pt walking down hall  Asked pt if he was leaving and pt threw his hands in the air and stated "I havent been seen yet"  Explained to pt he had to wait to be seen  Pt states he waited an hour and a half and he was leaving
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
## 2014-12-31 ENCOUNTER — Encounter (HOSPITAL_BASED_OUTPATIENT_CLINIC_OR_DEPARTMENT_OTHER): Payer: Medicaid Other | Attending: Plastic Surgery

## 2014-12-31 DIAGNOSIS — M869 Osteomyelitis, unspecified: Secondary | ICD-10-CM | POA: Insufficient documentation

## 2014-12-31 DIAGNOSIS — S9781XD Crushing injury of right foot, subsequent encounter: Secondary | ICD-10-CM | POA: Diagnosis not present

## 2014-12-31 DIAGNOSIS — X58XXXD Exposure to other specified factors, subsequent encounter: Secondary | ICD-10-CM | POA: Insufficient documentation

## 2014-12-31 DIAGNOSIS — L97512 Non-pressure chronic ulcer of other part of right foot with fat layer exposed: Secondary | ICD-10-CM | POA: Diagnosis present

## 2015-01-05 ENCOUNTER — Inpatient Hospital Stay (HOSPITAL_COMMUNITY)
Admission: EM | Admit: 2015-01-05 | Discharge: 2015-01-11 | DRG: 574 | Disposition: A | Discharge status: Home / Self Care | Payer: Medicaid Other | Attending: Plastic Surgery | Admitting: Plastic Surgery

## 2015-01-05 ENCOUNTER — Encounter (HOSPITAL_COMMUNITY)
Admission: EM | Disposition: A | Discharge status: Home / Self Care | Payer: Self-pay | Source: Home / Self Care | Attending: Orthopedic Surgery

## 2015-01-05 ENCOUNTER — Emergency Department (HOSPITAL_COMMUNITY): Payer: Medicaid Other

## 2015-01-05 ENCOUNTER — Emergency Department (HOSPITAL_COMMUNITY): Payer: Medicaid Other | Admitting: Certified Registered Nurse Anesthetist

## 2015-01-05 ENCOUNTER — Encounter (HOSPITAL_COMMUNITY): Payer: Self-pay | Admitting: Emergency Medicine

## 2015-01-05 DIAGNOSIS — R7302 Impaired glucose tolerance (oral): Secondary | ICD-10-CM | POA: Diagnosis present

## 2015-01-05 DIAGNOSIS — L02415 Cutaneous abscess of right lower limb: Principal | ICD-10-CM | POA: Diagnosis present

## 2015-01-05 DIAGNOSIS — B9689 Other specified bacterial agents as the cause of diseases classified elsewhere: Secondary | ICD-10-CM | POA: Diagnosis not present

## 2015-01-05 DIAGNOSIS — F17210 Nicotine dependence, cigarettes, uncomplicated: Secondary | ICD-10-CM | POA: Diagnosis present

## 2015-01-05 DIAGNOSIS — Z885 Allergy status to narcotic agent status: Secondary | ICD-10-CM

## 2015-01-05 DIAGNOSIS — L0291 Cutaneous abscess, unspecified: Secondary | ICD-10-CM | POA: Diagnosis present

## 2015-01-05 DIAGNOSIS — F329 Major depressive disorder, single episode, unspecified: Secondary | ICD-10-CM | POA: Diagnosis present

## 2015-01-05 DIAGNOSIS — B9562 Methicillin resistant Staphylococcus aureus infection as the cause of diseases classified elsewhere: Secondary | ICD-10-CM | POA: Diagnosis not present

## 2015-01-05 DIAGNOSIS — M869 Osteomyelitis, unspecified: Secondary | ICD-10-CM | POA: Diagnosis present

## 2015-01-05 DIAGNOSIS — S8001XA Contusion of right knee, initial encounter: Secondary | ICD-10-CM | POA: Diagnosis present

## 2015-01-05 DIAGNOSIS — L03115 Cellulitis of right lower limb: Secondary | ICD-10-CM | POA: Diagnosis present

## 2015-01-05 DIAGNOSIS — E876 Hypokalemia: Secondary | ICD-10-CM | POA: Diagnosis not present

## 2015-01-05 DIAGNOSIS — M86171 Other acute osteomyelitis, right ankle and foot: Secondary | ICD-10-CM | POA: Diagnosis not present

## 2015-01-05 DIAGNOSIS — L97509 Non-pressure chronic ulcer of other part of unspecified foot with unspecified severity: Secondary | ICD-10-CM | POA: Diagnosis present

## 2015-01-05 DIAGNOSIS — S70919A Unspecified superficial injury of unspecified hip, initial encounter: Secondary | ICD-10-CM

## 2015-01-05 DIAGNOSIS — L97519 Non-pressure chronic ulcer of other part of right foot with unspecified severity: Secondary | ICD-10-CM | POA: Diagnosis present

## 2015-01-05 DIAGNOSIS — S70929A Unspecified superficial injury of unspecified thigh, initial encounter: Secondary | ICD-10-CM

## 2015-01-05 DIAGNOSIS — S91301S Unspecified open wound, right foot, sequela: Secondary | ICD-10-CM | POA: Diagnosis not present

## 2015-01-05 DIAGNOSIS — Z79899 Other long term (current) drug therapy: Secondary | ICD-10-CM | POA: Diagnosis not present

## 2015-01-05 DIAGNOSIS — S81001A Unspecified open wound, right knee, initial encounter: Secondary | ICD-10-CM

## 2015-01-05 DIAGNOSIS — S92919A Unspecified fracture of unspecified toe(s), initial encounter for closed fracture: Secondary | ICD-10-CM | POA: Diagnosis present

## 2015-01-05 DIAGNOSIS — S90919A Unspecified superficial injury of unspecified ankle, initial encounter: Secondary | ICD-10-CM

## 2015-01-05 DIAGNOSIS — A419 Sepsis, unspecified organism: Secondary | ICD-10-CM

## 2015-01-05 DIAGNOSIS — L97512 Non-pressure chronic ulcer of other part of right foot with fat layer exposed: Secondary | ICD-10-CM

## 2015-01-05 DIAGNOSIS — S80929A Unspecified superficial injury of unspecified lower leg, initial encounter: Secondary | ICD-10-CM

## 2015-01-05 DIAGNOSIS — F32A Depression, unspecified: Secondary | ICD-10-CM | POA: Diagnosis present

## 2015-01-05 DIAGNOSIS — L089 Local infection of the skin and subcutaneous tissue, unspecified: Secondary | ICD-10-CM | POA: Diagnosis present

## 2015-01-05 HISTORY — PX: I&D EXTREMITY: SHX5045

## 2015-01-05 LAB — CBC
HEMATOCRIT: 36.2 % — AB (ref 39.0–52.0)
HEMOGLOBIN: 12.2 g/dL — AB (ref 13.0–17.0)
MCH: 29.8 pg (ref 26.0–34.0)
MCHC: 33.7 g/dL (ref 30.0–36.0)
MCV: 88.3 fL (ref 78.0–100.0)
Platelets: 298 10*3/uL (ref 150–400)
RBC: 4.1 MIL/uL — ABNORMAL LOW (ref 4.22–5.81)
RDW: 12.9 % (ref 11.5–15.5)
WBC: 10.5 10*3/uL (ref 4.0–10.5)

## 2015-01-05 LAB — I-STAT CG4 LACTIC ACID, ED: Lactic Acid, Venous: 2.22 mmol/L (ref 0.5–2.0)

## 2015-01-05 LAB — COMPREHENSIVE METABOLIC PANEL
ALBUMIN: 2.9 g/dL — AB (ref 3.5–5.0)
ALT: 29 U/L (ref 17–63)
ANION GAP: 9 (ref 5–15)
AST: 25 U/L (ref 15–41)
Alkaline Phosphatase: 78 U/L (ref 38–126)
BILIRUBIN TOTAL: 0.5 mg/dL (ref 0.3–1.2)
BUN: 17 mg/dL (ref 6–20)
CO2: 23 mmol/L (ref 22–32)
Calcium: 9.3 mg/dL (ref 8.9–10.3)
Chloride: 102 mmol/L (ref 101–111)
Creatinine, Ser: 0.88 mg/dL (ref 0.61–1.24)
GFR calc Af Amer: >60 mL/min (ref >60)
GLUCOSE: 199 mg/dL — AB (ref 65–99)
POTASSIUM: 3.6 mmol/L (ref 3.5–5.1)
Sodium: 134 mmol/L — ABNORMAL LOW (ref 135–145)
TOTAL PROTEIN: 7.6 g/dL (ref 6.5–8.1)

## 2015-01-05 LAB — PROCALCITONIN: Procalcitonin: 0.15 ng/mL

## 2015-01-05 SURGERY — IRRIGATION AND DEBRIDEMENT EXTREMITY
Anesthesia: General | Site: Leg Lower | Laterality: Right

## 2015-01-05 MED ORDER — ACETAMINOPHEN 10 MG/ML IV SOLN: 1000.0000 mg | Freq: Once | INTRAVENOUS | Status: DC

## 2015-01-05 MED ORDER — VANCOMYCIN HCL IN DEXTROSE 1-5 GM/200ML-% IV SOLN: 1000.0000 mg | Freq: Once | INTRAVENOUS | Status: DC

## 2015-01-05 MED ORDER — PROPOFOL 10 MG/ML IV BOLUS
INTRAVENOUS | Status: AC
  Filled 2015-01-05: qty 20

## 2015-01-05 MED ORDER — PROMETHAZINE HCL 25 MG/ML IJ SOLN
6.2500 mg | INTRAMUSCULAR | Status: DC | PRN
Start: 2015-01-05 — End: 2015-01-05

## 2015-01-05 MED ORDER — MIDAZOLAM HCL 5 MG/5ML IJ SOLN
INTRAMUSCULAR | Status: DC | PRN
  Administered 2015-01-05: 2 mg via INTRAVENOUS

## 2015-01-05 MED ORDER — HYDROMORPHONE HCL 1 MG/ML IJ SOLN: 0.2500 mg | INTRAMUSCULAR | Status: DC | PRN

## 2015-01-05 MED ORDER — FENTANYL CITRATE (PF) 100 MCG/2ML IJ SOLN
INTRAMUSCULAR | Status: DC | PRN
  Administered 2015-01-05: 50 ug via INTRAVENOUS
  Administered 2015-01-05 (×2): 100 ug via INTRAVENOUS
  Administered 2015-01-05 (×2): 50 ug via INTRAVENOUS
  Administered 2015-01-05: 150 ug via INTRAVENOUS

## 2015-01-05 MED ORDER — SODIUM CHLORIDE 0.9 % IV SOLN
1000.0000 mg | INTRAVENOUS | Status: DC | PRN
  Administered 2015-01-05: 1000 mg via INTRAVENOUS

## 2015-01-05 MED ORDER — PIPERACILLIN-TAZOBACTAM 3.375 G IVPB: 3.3750 g | Freq: Three times a day (TID) | INTRAVENOUS | Status: DC

## 2015-01-05 MED ORDER — FENTANYL CITRATE (PF) 250 MCG/5ML IJ SOLN
INTRAMUSCULAR | Status: AC
Start: 2015-01-05 — End: 2015-01-05
  Filled 2015-01-05: qty 5

## 2015-01-05 MED ORDER — SODIUM CHLORIDE 0.9 % IV SOLN
1750.0000 mg | Freq: Once | INTRAVENOUS | Status: DC
  Filled 2015-01-05: qty 1750

## 2015-01-05 MED ORDER — ONDANSETRON HCL 4 MG/2ML IJ SOLN
INTRAMUSCULAR | Status: DC | PRN
  Administered 2015-01-05: 4 mg via INTRAVENOUS

## 2015-01-05 MED ORDER — PIPERACILLIN-TAZOBACTAM 3.375 G IVPB
3.3750 g | Freq: Three times a day (TID) | INTRAVENOUS | Status: DC
  Administered 2015-01-05 – 2015-01-07 (×6): 3.375 g via INTRAVENOUS
  Filled 2015-01-05 (×8): qty 50

## 2015-01-05 MED ORDER — PIPERACILLIN-TAZOBACTAM 3.375 G IVPB 30 MIN: 3.3750 g | Freq: Once | INTRAVENOUS | Status: DC

## 2015-01-05 MED ORDER — LACTATED RINGERS IV SOLN
INTRAVENOUS | Status: DC | PRN
  Administered 2015-01-05: 18:00:00 via INTRAVENOUS

## 2015-01-05 MED ORDER — PROPOFOL 10 MG/ML IV BOLUS
INTRAVENOUS | Status: DC | PRN
Start: 2015-01-05 — End: 2015-01-05
  Administered 2015-01-05: 20 mg via INTRAVENOUS
  Administered 2015-01-05: 200 mg via INTRAVENOUS
  Administered 2015-01-05: 20 mg via INTRAVENOUS
  Administered 2015-01-05: 100 mg via INTRAVENOUS
  Administered 2015-01-05: 50 mg via INTRAVENOUS

## 2015-01-05 MED ORDER — VANCOMYCIN HCL IN DEXTROSE 1-5 GM/200ML-% IV SOLN: 1000.0000 mg | Freq: Two times a day (BID) | INTRAVENOUS | Status: DC

## 2015-01-05 MED ORDER — MIDAZOLAM HCL 2 MG/2ML IJ SOLN
INTRAMUSCULAR | Status: AC
  Filled 2015-01-05: qty 4

## 2015-01-05 MED ORDER — SODIUM CHLORIDE 0.9 % IV BOLUS (SEPSIS): 1000.0000 mL | INTRAVENOUS | Status: AC

## 2015-01-05 MED ORDER — OXYCODONE HCL 5 MG/5ML PO SOLN: 5.0000 mg | Freq: Once | ORAL | Status: DC | PRN

## 2015-01-05 MED ORDER — LACTATED RINGERS IV SOLN
INTRAVENOUS | Status: DC
  Administered 2015-01-05: 21:00:00 via INTRAVENOUS

## 2015-01-05 MED ORDER — VANCOMYCIN HCL IN DEXTROSE 1-5 GM/200ML-% IV SOLN
1000.0000 mg | Freq: Two times a day (BID) | INTRAVENOUS | Status: DC
  Administered 2015-01-06 (×2): 1000 mg via INTRAVENOUS
  Filled 2015-01-05 (×3): qty 200

## 2015-01-05 MED ORDER — PHENYLEPHRINE HCL 10 MG/ML IJ SOLN
INTRAMUSCULAR | Status: DC | PRN
  Administered 2015-01-05 (×2): 80 ug via INTRAVENOUS

## 2015-01-05 MED ORDER — ACETAMINOPHEN 10 MG/ML IV SOLN
INTRAVENOUS | Status: AC
Start: 2015-01-05 — End: 2015-01-05
  Administered 2015-01-05: 1000 mg via INTRAVENOUS
  Filled 2015-01-05: qty 100

## 2015-01-05 MED ORDER — FENTANYL CITRATE (PF) 250 MCG/5ML IJ SOLN
INTRAMUSCULAR | Status: AC
  Filled 2015-01-05: qty 5

## 2015-01-05 MED ORDER — SODIUM CHLORIDE 0.9 % IR SOLN
Status: DC | PRN
  Administered 2015-01-05: 1000 mL
  Administered 2015-01-05 (×2): 3000 mL

## 2015-01-05 MED ORDER — LIDOCAINE HCL (CARDIAC) 20 MG/ML IV SOLN
INTRAVENOUS | Status: DC | PRN
  Administered 2015-01-05: 100 mg via INTRAVENOUS

## 2015-01-05 MED ORDER — OXYCODONE HCL 5 MG PO TABS: 5.0000 mg | ORAL_TABLET | Freq: Once | ORAL | Status: DC | PRN

## 2015-01-05 SURGICAL SUPPLY — 58 items
BAG DECANTER FOR FLEXI CONT (MISCELLANEOUS) ×3 IMPLANT
BANDAGE ELASTIC 4 VELCRO ST LF (GAUZE/BANDAGES/DRESSINGS) ×3 IMPLANT
BNDG CMPR MED 15X6 ELC VLCR LF (GAUZE/BANDAGES/DRESSINGS) ×1
BNDG COHESIVE 4X5 TAN STRL (GAUZE/BANDAGES/DRESSINGS) ×3 IMPLANT
BNDG ELASTIC 6X15 VLCR STRL LF (GAUZE/BANDAGES/DRESSINGS) ×3 IMPLANT
BNDG GAUZE ELAST 4 BULKY (GAUZE/BANDAGES/DRESSINGS) ×3 IMPLANT
CANISTER WOUND CARE 500ML ATS (WOUND CARE) ×2 IMPLANT
CASSETTE VERAFLO VERALINK (MISCELLANEOUS) ×2 IMPLANT
COVER SURGICAL LIGHT HANDLE (MISCELLANEOUS) ×3 IMPLANT
CUFF TOURNIQUET SINGLE 18IN (TOURNIQUET CUFF) ×3 IMPLANT
CUFF TOURNIQUET SINGLE 24IN (TOURNIQUET CUFF) IMPLANT
CUFF TOURNIQUET SINGLE 34IN LL (TOURNIQUET CUFF) IMPLANT
CUFF TOURNIQUET SINGLE 44IN (TOURNIQUET CUFF) IMPLANT
DRSG EMULSION OIL 3X3 NADH (GAUZE/BANDAGES/DRESSINGS) ×3 IMPLANT
DRSG PAD ABDOMINAL 8X10 ST (GAUZE/BANDAGES/DRESSINGS) ×6 IMPLANT
DRSG VAC ATS MED SENSATRAC (GAUZE/BANDAGES/DRESSINGS) ×2 IMPLANT
ELECT PENCIL ROCKER SW 15FT (MISCELLANEOUS) ×3 IMPLANT
ELECT REM PT RETURN 9FT ADLT (ELECTROSURGICAL)
ELECTRODE REM PT RTRN 9FT ADLT (ELECTROSURGICAL) IMPLANT
GAUZE SPONGE 4X4 12PLY STRL (GAUZE/BANDAGES/DRESSINGS) ×3 IMPLANT
GAUZE XEROFORM 5X9 LF (GAUZE/BANDAGES/DRESSINGS) ×3 IMPLANT
GLOVE BIOGEL PI IND STRL 8 (GLOVE) ×1 IMPLANT
GLOVE BIOGEL PI IND STRL 8.5 (GLOVE) ×1 IMPLANT
GLOVE BIOGEL PI INDICATOR 8 (GLOVE) ×2
GLOVE BIOGEL PI INDICATOR 8.5 (GLOVE) ×2
GLOVE ORTHO TXT STRL SZ7.5 (GLOVE) ×3 IMPLANT
GLOVE SS BIOGEL STRL SZ 8.5 (GLOVE) ×1 IMPLANT
GLOVE SUPERSENSE BIOGEL SZ 8.5 (GLOVE) ×2
GOWN STRL REUS W/ TWL LRG LVL3 (GOWN DISPOSABLE) ×1 IMPLANT
GOWN STRL REUS W/TWL 2XL LVL3 (GOWN DISPOSABLE) ×6 IMPLANT
GOWN STRL REUS W/TWL LRG LVL3 (GOWN DISPOSABLE) ×3
HANDPIECE INTERPULSE COAX TIP (DISPOSABLE)
KIT BASIN OR (CUSTOM PROCEDURE TRAY) ×3 IMPLANT
KIT ROOM TURNOVER OR (KITS) ×3 IMPLANT
MANIFOLD NEPTUNE II (INSTRUMENTS) ×3 IMPLANT
NDL HYPO 25GX1X1/2 BEV (NEEDLE) ×1 IMPLANT
NEEDLE HYPO 25GX1X1/2 BEV (NEEDLE) ×3 IMPLANT
NS IRRIG 1000ML POUR BTL (IV SOLUTION) ×3 IMPLANT
PACK ORTHO EXTREMITY (CUSTOM PROCEDURE TRAY) ×3 IMPLANT
PAD ARMBOARD 7.5X6 YLW CONV (MISCELLANEOUS) ×6 IMPLANT
SET HNDPC FAN SPRY TIP SCT (DISPOSABLE) IMPLANT
SPONGE LAP 18X18 X RAY DECT (DISPOSABLE) ×3 IMPLANT
SPONGE LAP 4X18 X RAY DECT (DISPOSABLE) ×3 IMPLANT
STOCKINETTE IMPERVIOUS 9X36 MD (GAUZE/BANDAGES/DRESSINGS) ×3 IMPLANT
SURGIFLO W/THROMBIN 8M KIT (HEMOSTASIS) IMPLANT
SUT BONE WAX W31G (SUTURE) ×3 IMPLANT
SUT ETHILON 4 0 PS 2 18 (SUTURE) ×12 IMPLANT
SUT PDS 0 CT 1 18  CR/8 (SUTURE) ×6 IMPLANT
SUT VIC AB 2-0 CT1 18 (SUTURE) ×3 IMPLANT
SYR CONTROL 10ML LL (SYRINGE) ×3 IMPLANT
TOWEL OR 17X24 6PK STRL BLUE (TOWEL DISPOSABLE) ×3 IMPLANT
TOWEL OR 17X26 10 PK STRL BLUE (TOWEL DISPOSABLE) ×3 IMPLANT
TUBE ANAEROBIC SPECIMEN COL (MISCELLANEOUS) IMPLANT
TUBE CONNECTING 12'X1/4 (SUCTIONS) ×1
TUBE CONNECTING 12X1/4 (SUCTIONS) ×2 IMPLANT
UNDERPAD 30X30 INCONTINENT (UNDERPADS AND DIAPERS) ×3 IMPLANT
WATER STERILE IRR 1000ML POUR (IV SOLUTION) ×3 IMPLANT
YANKAUER SUCT BULB TIP NO VENT (SUCTIONS) ×3 IMPLANT

## 2015-01-05 NOTE — ED Notes (Signed)
Patient transported to OR.

## 2015-01-05 NOTE — Anesthesia Postprocedure Evaluation (Signed)
  Anesthesia Post-op Note  Patient: [name] [name]  Procedure(s) Performed: Procedure(s) (LRB): IRRIGATION AND DEBRIDEMENT DISTAL THIGH (Right)  Patient Location: PACU  Anesthesia Type: General  Level of Consciousness: awake and alert   Airway and Oxygen Therapy: Patient Spontanous Breathing  Post-op Pain: mild  Post-op Assessment: Post-op Vital signs reviewed, Patient's Cardiovascular Status Stable, Respiratory Function Stable, Patent Airway and No signs of Nausea or vomiting  Last Vitals:  Filed Vitals:   01/05/15 2045  BP: 124/63  Pulse:   Temp:   Resp: 25    Post-op Vital Signs: stable   Complications: No apparent anesthesia complications

## 2015-01-05 NOTE — Addendum Note (Signed)
Addendum  created 01/05/15 2106 by Eilene Ghazi, MD   Modules edited: Orders

## 2015-01-05 NOTE — Progress Notes (Signed)
ANTIBIOTIC CONSULT NOTE  Pharmacy Consult for vancomycin, zosyn Indication: cellulitis  Allergies  Allergen Reactions  . Hydrocodone-Acetaminophen Itching    Patient Measurements: Height: 5\' 8"  (172.7 cm) Weight: 197 lb (89.359 kg) IBW/kg (Calculated) : 68.4 Adjusted Body Weight:   Vital Signs: Temp: 98.4 F (36.9 C) (09/17 1212) Temp Source: Oral (09/17 1212) BP: 149/72 mmHg (09/17 1212) Pulse Rate: 95 (09/17 1212) Intake/Output from previous day:   Intake/Output from this shift:    Labs:  Recent Labs  01/05/15 1305  WBC 10.5  HGB 12.2*  PLT 298  CREATININE 0.88   Estimated Creatinine Clearance: 111.5 mL/min (by C-G formula based on Cr of 0.88). No results for input(s): VANCOTROUGH, VANCOPEAK, VANCORANDOM, GENTTROUGH, GENTPEAK, GENTRANDOM, TOBRATROUGH, TOBRAPEAK, TOBRARND, AMIKACINPEAK, AMIKACINTROU, AMIKACIN in the last 72 hours.   Microbiology: Recent Results (from the past 720 hour(s))  Anaerobic culture     Status: None   Collection Time: 12/12/14  2:53 PM  Result Value Ref Range Status   Specimen Description WOUND FOOT RIGHT  Final   Special Requests PT ON ANCEF  Final   Gram Stain   Final    RARE WBC PRESENT, PREDOMINANTLY PMN NO SQUAMOUS EPITHELIAL CELLS SEEN NO ORGANISMS SEEN Performed at [facility]    Culture   Final    MULTIPLE ORGANISMS PRESENT, NONE PREDOMINANT Performed at [facility]    Report Status 12/17/2014 FINAL  Final  Wound culture     Status: None   Collection Time: 12/12/14  2:53 PM  Result Value Ref Range Status   Specimen Description WOUND FOOT RIGHT  Final   Special Requests PT ON ANCEF  Final   Gram Stain   Final    RARE WBC PRESENT,BOTH PMN AND MONONUCLEAR NO SQUAMOUS EPITHELIAL CELLS SEEN NO ORGANISMS SEEN Performed at [facility]    Culture   Final    FEW METHICILLIN RESISTANT STAPHYLOCOCCUS AUREUS Note: RIFAMPIN AND GENTAMICIN SHOULD NOT BE USED AS SINGLE DRUGS FOR TREATMENT OF STAPH  INFECTIONS. This organism DOES NOT demonstrate inducible Clindamycin resistance in vitro. Performed at [facility]    Report Status 12/16/2014 FINAL  Final   Organism ID, Bacteria METHICILLIN RESISTANT STAPHYLOCOCCUS AUREUS  Final      Susceptibility   Methicillin resistant staphylococcus aureus - MIC*    CLINDAMYCIN <=0.25 SENSITIVE Sensitive     ERYTHROMYCIN >=8 RESISTANT Resistant     GENTAMICIN <=0.5 SENSITIVE Sensitive     LEVOFLOXACIN 4 INTERMEDIATE Intermediate     OXACILLIN >=4 RESISTANT Resistant     RIFAMPIN <=0.5 SENSITIVE Sensitive     TRIMETH/SULFA <=10 SENSITIVE Sensitive     VANCOMYCIN 1 SENSITIVE Sensitive     TETRACYCLINE <=1 SENSITIVE Sensitive     * FEW METHICILLIN RESISTANT STAPHYLOCOCCUS AUREUS    Anti-infectives    Start     Dose/Rate Route Frequency Ordered Stop   01/05/15 1700  vancomycin (VANCOCIN) 1,750 mg in sodium chloride 0.9 % 500 mL IVPB     1,750 mg 250 mL/hr over 120 Minutes Intravenous  Once 01/05/15 1619     01/05/15 1615  piperacillin-tazobactam (ZOSYN) IVPB 3.375 g     3.375 g 100 mL/hr over 30 Minutes Intravenous  Once 01/05/15 1605     01/05/15 1615  vancomycin (VANCOCIN) IVPB 1000 mg/200 mL premix  Status:  Discontinued     1,000 mg 200 mL/hr over 60 Minutes Intravenous  Once 01/05/15 1605 01/05/15 1619      Assessment: 49 yo male with  hematoma, initiating abx for cellulitis.  Pt was started on amoxicillin as an outpatient. WBC 10.5, SCr 0.8, eCrCl ~90 ml/min.   Goal of Therapy:  Vancomycin trough level 10-15 mcg/ml  Plan:  -Zosyn 3.375 g IV q8h -Vancomycin 1750 mg IV x1, 1g/12h -Monitor renal fx, duration of therapy, VT at [facility]    Agapito Games, PharmD, BCPS Clinical Pharmacist Pager: [phone] 01/05/2015 4:20 PM

## 2015-01-05 NOTE — ED Notes (Signed)
Pt c/o pain to right leg x 3 days. Area red swollen, reports drainage. Pt seen by PMD Monday and given prescription for amoxicillin but not taken until yesterday.

## 2015-01-05 NOTE — ED Notes (Signed)
Patient C/O Right leg pain that is medial to his knee.  Patient states that he has a hematoma  And he thinks that it is infected.  Patient reports yellow drainage. RN notes a large swollen area medial to his right knee. It is red, warm and tender to touch.
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
## 2015-01-05 NOTE — ED Notes (Signed)
Unable to find pt in waiting room. 
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
## 2015-01-05 NOTE — Progress Notes (Signed)
D; T 102 axillary A; notified MD Rose, new order received 1g IV tylenol given per order
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
## 2015-01-05 NOTE — Anesthesia Preprocedure Evaluation (Addendum)
Anesthesia Evaluation  Patient identified by MRN, date of birth, ID band Patient awake    Reviewed: Allergy & Precautions, Patient's Chart, lab work & pertinent test results  History of Anesthesia Complications Negative for: history of anesthetic complications  Airway Mallampati: II  TM Distance: >3 FB Neck ROM: Full    Dental  (+) Teeth Intact   Pulmonary Current Smoker,    breath sounds clear to auscultation       Cardiovascular negative cardio ROS   Rhythm:Regular Rate:Normal     Neuro/Psych PSYCHIATRIC DISORDERS Anxiety Depression negative neurological ROS     GI/Hepatic negative GI ROS, Neg liver ROS,   Endo/Other  negative endocrine ROS  Renal/GU negative Renal ROS  negative genitourinary   Musculoskeletal negative musculoskeletal ROS (+)   Abdominal   Peds negative pediatric ROS (+)  Hematology negative hematology ROS (+)   Anesthesia Other Findings   Reproductive/Obstetrics negative OB ROS                            Anesthesia Physical  Anesthesia Plan  ASA: II  Anesthesia Plan: General   Post-op Pain Management:    Induction: Intravenous  Airway Management Planned: LMA  Additional Equipment:   Intra-op Plan:   Post-operative Plan: Extubation in OR  Informed Consent: I have reviewed the patients History and Physical, chart, labs and discussed the procedure including the risks, benefits and alternatives for the proposed anesthesia with the patient or authorized representative who has indicated his/her understanding and acceptance.   Dental advisory given  Plan Discussed with: CRNA and Anesthesiologist  Anesthesia Plan Comments: (Distal right thigh abscess drainage To get Vanc and Zosyn Oral ETT vs. LMA 1 PIV)       Anesthesia Quick Evaluation

## 2015-01-05 NOTE — Anesthesia Procedure Notes (Signed)
Procedure Name: LMA Insertion Date/Time: 01/05/2015 6:58 PM Performed by: Daiva Eves Pre-anesthesia Checklist: Patient identified, Timeout performed, Emergency Drugs available, Suction available and Patient being monitored Patient Re-evaluated:Patient Re-evaluated prior to inductionOxygen Delivery Method: Circle system utilized Preoxygenation: Pre-oxygenation with 100% oxygen Intubation Type: IV induction LMA: LMA inserted LMA Size: 5.0 Number of attempts: 1 Placement Confirmation: positive ETCO2,  CO2 detector and breath sounds checked- equal and bilateral Tube secured with: Tape Dental Injury: Teeth and Oropharynx as per pre-operative assessment

## 2015-01-05 NOTE — Transfer of Care (Signed)
Immediate Anesthesia Transfer of Care Note  Patient: [name] [name]  Procedure(s) Performed: Procedure(s): IRRIGATION AND DEBRIDEMENT DISTAL THIGH (Right)  Patient Location: PACU  Anesthesia Type:General  Level of Consciousness: awake, alert  and oriented  Airway & Oxygen Therapy: Patient Spontanous Breathing and Patient connected to nasal cannula oxygen  Post-op Assessment: Report given to RN and Post -op Vital signs reviewed and stable  Post vital signs: Reviewed and stable  Last Vitals:  Filed Vitals:   01/05/15 1945  BP: 127/73  Pulse: 108  Temp:   Resp: 15    Complications: No apparent anesthesia complications

## 2015-01-05 NOTE — Brief Op Note (Signed)
01/05/2015  7:31 PM  PATIENT:  [name] [name]  49 y.o. male  PRE-OPERATIVE DIAGNOSIS:  right thigh abscess  POST-OPERATIVE DIAGNOSIS:  right thigh abscess  PROCEDURE:  Procedure(s): IRRIGATION AND DEBRIDEMENT DISTAL THIGH (Right)  SURGEON:  Surgeon(s) and Role:    * Venita Lick, MD - Primary  PHYSICIAN ASSISTANT:   ASSISTANTS: Carmen Mayo   ANESTHESIA:   general  EBL:  Total I/O In: -  Out: 5 [Blood:5]  BLOOD ADMINISTERED:none  DRAINS: none   LOCAL MEDICATIONS USED:  MARCAINE     SPECIMEN:  Source of Specimen:  abscess  DISPOSITION OF SPECIMEN:  micro  COUNTS:  YES  TOURNIQUET:  * No tourniquets in log *  DICTATION: .Other Dictation: Dictation Number [id number]  PLAN OF CARE: Admit to inpatient   PATIENT DISPOSITION:  PACU - hemodynamically stable.

## 2015-01-06 LAB — COMPREHENSIVE METABOLIC PANEL
ALT: 26 U/L (ref 17–63)
AST: 19 U/L (ref 15–41)
Albumin: 2.3 g/dL — ABNORMAL LOW (ref 3.5–5.0)
Alkaline Phosphatase: 56 U/L (ref 38–126)
Anion gap: 7 (ref 5–15)
BILIRUBIN TOTAL: 0.5 mg/dL (ref 0.3–1.2)
BUN: 10 mg/dL (ref 6–20)
CALCIUM: 8.4 mg/dL — AB (ref 8.9–10.3)
CO2: 24 mmol/L (ref 22–32)
CREATININE: 0.82 mg/dL (ref 0.61–1.24)
Chloride: 104 mmol/L (ref 101–111)
GFR calc Af Amer: >60 mL/min (ref >60)
Glucose, Bld: 145 mg/dL — ABNORMAL HIGH (ref 65–99)
POTASSIUM: 3.4 mmol/L — AB (ref 3.5–5.1)
Sodium: 135 mmol/L (ref 135–145)
TOTAL PROTEIN: 6 g/dL — AB (ref 6.5–8.1)

## 2015-01-06 LAB — CBC
HEMATOCRIT: 30.1 % — AB (ref 39.0–52.0)
Hemoglobin: 10.2 g/dL — ABNORMAL LOW (ref 13.0–17.0)
MCH: 30.2 pg (ref 26.0–34.0)
MCHC: 33.9 g/dL (ref 30.0–36.0)
MCV: 89.1 fL (ref 78.0–100.0)
Platelets: 241 10*3/uL (ref 150–400)
RBC: 3.38 MIL/uL — ABNORMAL LOW (ref 4.22–5.81)
RDW: 13 % (ref 11.5–15.5)
WBC: 10 10*3/uL (ref 4.0–10.5)

## 2015-01-06 MED ORDER — MORPHINE SULFATE (PF) 2 MG/ML IV SOLN: 2.0000 mg | INTRAVENOUS | Status: DC | PRN

## 2015-01-06 MED ORDER — METOCLOPRAMIDE HCL 5 MG/ML IJ SOLN
5.0000 mg | Freq: Three times a day (TID) | INTRAMUSCULAR | Status: DC | PRN
  Administered 2015-01-11: 10 mg via INTRAVENOUS
  Filled 2015-01-06: qty 2

## 2015-01-06 MED ORDER — TRAMADOL HCL 50 MG PO TABS
50.0000 mg | ORAL_TABLET | Freq: Four times a day (QID) | ORAL | Status: DC | PRN
  Administered 2015-01-06 (×3): 50 mg via ORAL
  Filled 2015-01-06 (×3): qty 1

## 2015-01-06 MED ORDER — OXYCODONE-ACETAMINOPHEN 5-325 MG PO TABS: 1.0000 | ORAL_TABLET | ORAL | Status: DC | PRN

## 2015-01-06 MED ORDER — MORPHINE SULFATE (PF) 2 MG/ML IV SOLN
2.0000 mg | INTRAVENOUS | Status: DC | PRN
Start: 2015-01-06 — End: 2015-01-11
  Administered 2015-01-08 – 2015-01-10 (×5): 2 mg via INTRAVENOUS
  Filled 2015-01-06 (×5): qty 1

## 2015-01-06 MED ORDER — METFORMIN HCL 500 MG PO TABS
500.0000 mg | ORAL_TABLET | Freq: Every day | ORAL | Status: DC
  Administered 2015-01-07 – 2015-01-11 (×5): 500 mg via ORAL
  Filled 2015-01-06 (×5): qty 1

## 2015-01-06 MED ORDER — OXYCODONE HCL 5 MG PO TABS
5.0000 mg | ORAL_TABLET | ORAL | Status: DC | PRN
  Administered 2015-01-07 – 2015-01-10 (×18): 10 mg via ORAL
  Administered 2015-01-10: 5 mg via ORAL
  Administered 2015-01-10 – 2015-01-11 (×6): 10 mg via ORAL
  Filled 2015-01-06 (×26): qty 2

## 2015-01-06 MED ORDER — DIPHENHYDRAMINE HCL 12.5 MG/5ML PO ELIX: 12.5000 mg | ORAL_SOLUTION | ORAL | Status: DC | PRN

## 2015-01-06 MED ORDER — METOCLOPRAMIDE HCL 5 MG PO TABS: 5.0000 mg | ORAL_TABLET | Freq: Three times a day (TID) | ORAL | Status: DC | PRN

## 2015-01-06 MED ORDER — FLEET ENEMA 7-19 GM/118ML RE ENEM: 1.0000 | ENEMA | Freq: Once | RECTAL | Status: DC | PRN

## 2015-01-06 MED ORDER — ONDANSETRON HCL 4 MG/2ML IJ SOLN: 4.0000 mg | Freq: Four times a day (QID) | INTRAMUSCULAR | Status: DC | PRN

## 2015-01-06 MED ORDER — ONDANSETRON HCL 4 MG PO TABS: 4.0000 mg | ORAL_TABLET | Freq: Four times a day (QID) | ORAL | Status: DC | PRN
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
## 2015-01-06 NOTE — Progress Notes (Signed)
On a routine safety rounding this hour, patients wound vac was noted in the bed for the second time and patient was noted in the room. The bathroom was checked and he was not there. Charge nurse was notified.

## 2015-01-06 NOTE — Progress Notes (Signed)
During initial assessment this evening, patients wound vac was noted in his bed but patient was not in the room. He came back later and said, he was taking a shower in the bathroom. He was educated on the importance of keeping the wound vac on at all time and keeping the wound bed dry. He was receptive to teaching.

## 2015-01-06 NOTE — Progress Notes (Signed)
   Subjective: 1 Day Post-Op Procedure(s) (LRB): IRRIGATION AND DEBRIDEMENT DISTAL THIGH (Right)  Pt c/o mild to moderate pain in the right thigh and right foot s/p surgery Last visit pt only given tylenol and advil due to previous use of narcotics Denies any other symptoms IV antibiotics currently being administered Patient reports pain as moderate.  Objective:   VITALS:   Filed Vitals:   01/06/15 0521  BP: 117/58  Pulse: 94  Temp: 100.5 F (38.1 C)  Resp: 17    Right lower leg: wound vac in place  Mild erythema to inner thigh down distal past the medial knee Mild edema of entire leg Cam boot currently in place to right lower extremity  LABS  Recent Labs  01/05/15 1305 01/06/15 0516  HGB 12.2* 10.2*  HCT 36.2* 30.1*  WBC 10.5 10.0  PLT 298 241     Recent Labs  01/05/15 1305 01/06/15 0516  NA 134* 135  K 3.6 3.4*  BUN 17 10  CREATININE 0.88 0.82  GLUCOSE 199* 145*     Assessment/Plan: 1 Day Post-Op Procedure(s) (LRB): IRRIGATION AND DEBRIDEMENT DISTAL THIGH (Right) Continue IV antibiotics  Plan to discuss case with Dr. Kelly Splinter tomorrow  Alphonsa Overall, MPAS, PA-C  01/06/2015, 7:15 AM

## 2015-01-07 ENCOUNTER — Other Ambulatory Visit: Payer: Self-pay | Admitting: Plastic Surgery

## 2015-01-07 ENCOUNTER — Encounter (HOSPITAL_COMMUNITY): Payer: Self-pay | Admitting: Orthopedic Surgery

## 2015-01-07 DIAGNOSIS — S91301S Unspecified open wound, right foot, sequela: Secondary | ICD-10-CM

## 2015-01-07 DIAGNOSIS — E119 Type 2 diabetes mellitus without complications: Secondary | ICD-10-CM

## 2015-01-07 DIAGNOSIS — S81001A Unspecified open wound, right knee, initial encounter: Secondary | ICD-10-CM

## 2015-01-07 DIAGNOSIS — B9689 Other specified bacterial agents as the cause of diseases classified elsewhere: Secondary | ICD-10-CM

## 2015-01-07 DIAGNOSIS — L97512 Non-pressure chronic ulcer of other part of right foot with fat layer exposed: Secondary | ICD-10-CM

## 2015-01-07 DIAGNOSIS — L02415 Cutaneous abscess of right lower limb: Principal | ICD-10-CM

## 2015-01-07 DIAGNOSIS — Z89421 Acquired absence of other right toe(s): Secondary | ICD-10-CM

## 2015-01-07 DIAGNOSIS — M86171 Other acute osteomyelitis, right ankle and foot: Secondary | ICD-10-CM

## 2015-01-07 DIAGNOSIS — X58XXXS Exposure to other specified factors, sequela: Secondary | ICD-10-CM

## 2015-01-07 DIAGNOSIS — Z9889 Other specified postprocedural states: Secondary | ICD-10-CM

## 2015-01-07 LAB — BASIC METABOLIC PANEL
ANION GAP: 8 (ref 5–15)
BUN: 7 mg/dL (ref 6–20)
CALCIUM: 8.7 mg/dL — AB (ref 8.9–10.3)
CHLORIDE: 99 mmol/L — AB (ref 101–111)
CO2: 26 mmol/L (ref 22–32)
CREATININE: 0.76 mg/dL (ref 0.61–1.24)
GFR calc Af Amer: >60 mL/min (ref >60)
GFR calc non Af Amer: >60 mL/min (ref >60)
GLUCOSE: 154 mg/dL — AB (ref 65–99)
Potassium: 3.2 mmol/L — ABNORMAL LOW (ref 3.5–5.1)
Sodium: 133 mmol/L — ABNORMAL LOW (ref 135–145)

## 2015-01-07 MED ORDER — CEFAZOLIN SODIUM-DEXTROSE 2-3 GM-% IV SOLR
2.0000 g | INTRAVENOUS | Status: AC
  Administered 2015-01-08: 2 g via INTRAVENOUS
  Filled 2015-01-07: qty 50

## 2015-01-07 MED ORDER — VANCOMYCIN HCL IN DEXTROSE 1-5 GM/200ML-% IV SOLN
1000.0000 mg | Freq: Two times a day (BID) | INTRAVENOUS | Status: DC
  Administered 2015-01-07 – 2015-01-09 (×5): 1000 mg via INTRAVENOUS
  Filled 2015-01-07 (×7): qty 200

## 2015-01-07 MED ORDER — DEXTROSE 5 % IV SOLN
2.0000 g | Freq: Three times a day (TID) | INTRAVENOUS | Status: DC
  Administered 2015-01-07 – 2015-01-08 (×2): 2 g via INTRAVENOUS
  Filled 2015-01-07 (×4): qty 2

## 2015-01-07 MED ORDER — CEFTAZIDIME 2 G IJ SOLR
2.0000 g | Freq: Three times a day (TID) | INTRAMUSCULAR | Status: DC
  Filled 2015-01-07 (×2): qty 2

## 2015-01-07 MED ORDER — LIVING WELL WITH DIABETES BOOK
Freq: Once | Status: AC
  Administered 2015-01-07: 15:00:00
  Filled 2015-01-07: qty 1

## 2015-01-07 MED ORDER — POTASSIUM CHLORIDE CRYS ER 20 MEQ PO TBCR
20.0000 meq | EXTENDED_RELEASE_TABLET | Freq: Every day | ORAL | Status: DC
  Administered 2015-01-07 – 2015-01-09 (×3): 20 meq via ORAL
  Filled 2015-01-07 (×3): qty 1

## 2015-01-07 NOTE — Progress Notes (Addendum)
Chart reviewed Patient being managed expertly by Ortho Medical issues currently imparied glucose tolerance-started metformin -needs education with DM coordinator.  Ordered A1c Mild hypokalemia -replace with Kdur 20 [ordered] Abx and further management per Ortho and Plastics  We will sign off Thanks  Pleas Koch, MD [facility] [phone]

## 2015-01-07 NOTE — Consult Note (Signed)
[facility]  Date of Admission:  01/05/2015  Date of Consult:  01/07/2015  Reason for Consult: Abscess R thigh Referring Physician: Rolena Infante  Impression/Recommendation R thigh Abscess R foot open wound, osteomyelitis  Returning to OR in AM  Change zosyn, to ceftaz (he doesn't need anaerobe coverage)  Await Cx final  Recheck his ESR  He will most likely need PIC, I am not sure if home health will take him back.   Case mgmt eval  DM2  Appreciate Diabetic educator  Follow FSG  Health Maintenance  Check HIV and Hep C  Therapeutic Drug monitoring  His Cr is stable on vanco.   Will f/u trough with pharmacy.   Thank you so much for this interesting consult,   Bobby Rumpf (pager) [phone] [email or web address]  Hanley Woerner is an 49 y.o. male.  HPI: 49 yo M with hx of R foot crush injury 09-2014 and then found to have osteomyelitis. He underwent partial amputation (toes 4,5), found on Cx to have pseudomonas. He was treated with zosyn however this was curtailed due to interactions with home health agency staff. He was seen in ID office for f/u 8-8 and was transitioned to Cipro (intermediate sensitivity) for 1 month.  This wound was again debrided on 8-24, Cx grew MRSA.  He returns to the hospital on 9-17 with R thigh abscess draining for 3 days pta. In ED his WBC was normal, his temp was 103.2. He attributes this to a previous hematoma in his RLE getting infected.  He was started on vanco/zosyn. He was taken to OR on 9-17 and a large R thigh abscess was debrided.  His Cx is pending.  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   Past Medical History  Diagnosis Date  . Depression   . Fracture of multiple toes 09/25/2014    right foot  . History of cellulitis 10/21/2014    right foot  . Numbness in right leg 09/25/2014    from knee down, per pt.    Past Surgical History  Procedure Laterality Date  . Cataract  extraction w/ intraocular lens implant Right   . Pyloromyotomy      age 68 weeks  . Hand surgery Left 2000    dog bite  . Orif toe fracture Right 11/01/2014    Procedure: OPEN REDUCTION INTERNAL FIXATION (ORIF) OF RIGHT HALLUX FRACTURE ;  Surgeon: [name], MD;  Location: [place];  Service: Orthopedics;  Laterality: Right;  . I&d extremity Right 11/01/2014    Procedure: IRRIGATION AND DEBRIDEMENT RIGHT FOOT WOUNDS;  Surgeon: [name], MD;  Location: [place];  Service: Orthopedics;  Laterality: Right;  . Application of a-cell of extremity Right 11/01/2014    Procedure: APPLICATION OF A-CELL TO RIGHT DORSAL FOOT;  Surgeon: [name], MD;  Location: [place];  Service: Orthopedics;  Laterality: Right;  . I&d extremity Right 11/12/2014    Procedure: IRRIGATION AND DEBRIDEMENT WITH SURGICAL PREP OF RIGHT FOOT WOUND ;  Surgeon: Theodoro Kos, DO;  Location: [place];  Service: Plastics;  Laterality: Right;  . Application of a-cell of extremity Right 11/12/2014    Procedure: APPLICATION OF A-CELL ;  Surgeon: Theodoro Kos, DO;  Location: [place];  Service: Plastics;  Laterality: Right;  . Application of wound vac Right 11/12/2014    Procedure: APPLICATION OF WOUND VAC;  Surgeon: Theodoro Kos, DO;  Location: [place];  Service: Plastics;  Laterality: Right;  . I&d extremity Right 12/12/2014    Procedure: IRRIGATION AND DEBRIDEMENT OF RIGHT FOOT WOUND ;  Surgeon: Theodoro Kos, DO;  Location: [place];  Service: Plastics;  Laterality: Right;  . Application of a-cell of extremity Right 12/12/2014    Procedure: PLACEMENT OF A-CELL ;  Surgeon: Theodoro Kos, DO;  Location: [place];  Service: Plastics;  Laterality: Right;  . I&d extremity Right 01/05/2015    Procedure: IRRIGATION AND DEBRIDEMENT DISTAL THIGH;  Surgeon: Melina Schools, MD;  Location: [place];  Service: Orthopedics;  Laterality: Right;     Allergies  Allergen Reactions  . Hydrocodone-Acetaminophen  Itching    Medications:  Scheduled: . metFORMIN  500 mg Oral Q breakfast  . piperacillin-tazobactam (ZOSYN)  IV  3.375 g Intravenous 3 times per day  . potassium chloride  20 mEq Oral Daily  . vancomycin  1,000 mg Intravenous Q12H    Abtx:  Anti-infectives    Start     Dose/Rate Route Frequency Ordered Stop   01/07/15 1400  vancomycin (VANCOCIN) IVPB 1000 mg/200 mL premix     1,000 mg 200 mL/hr over 60 Minutes Intravenous Every 12 hours 01/07/15 1337     01/06/15 0600  vancomycin (VANCOCIN) IVPB 1000 mg/200 mL premix  Status:  Discontinued     1,000 mg 200 mL/hr over 60 Minutes Intravenous Every 12 hours 01/05/15 1955 01/06/15 2238   01/06/15 0500  vancomycin (VANCOCIN) IVPB 1000 mg/200 mL premix  Status:  Discontinued     1,000 mg 200 mL/hr over 60 Minutes Intravenous Every 12 hours 01/05/15 1625 01/05/15 1707   01/06/15 0100  piperacillin-tazobactam (ZOSYN) IVPB 3.375 g  Status:  Discontinued     3.375 g 12.5 mL/hr over 240 Minutes Intravenous 3 times per day 01/05/15 1625 01/05/15 1707   01/05/15 2000  piperacillin-tazobactam (ZOSYN) IVPB 3.375 g     3.375 g 12.5 mL/hr over 240 Minutes Intravenous 3 times per day 01/05/15 1954     01/05/15 1700  vancomycin (VANCOCIN) 1,750 mg in sodium chloride 0.9 % 500 mL IVPB  Status:  Discontinued     1,750 mg 250 mL/hr over 120 Minutes Intravenous  Once 01/05/15 1619 01/05/15 1707   01/05/15 1615  piperacillin-tazobactam (ZOSYN) IVPB 3.375 g  Status:  Discontinued     3.375 g 100 mL/hr over 30 Minutes Intravenous  Once 01/05/15 1605 01/05/15 1707   01/05/15 1615  vancomycin (VANCOCIN) IVPB 1000 mg/200 mL premix  Status:  Discontinued     1,000 mg 200 mL/hr over 60 Minutes Intravenous  Once 01/05/15 1605 01/05/15 1619      Total days of antibiotics: 3 vanco/zosyn          Social History:  reports that he has been smoking Cigarettes.  He has a 30 pack-year smoking history. He has never used smokeless tobacco. He reports that he  drinks about 3.6 oz of alcohol per week. He reports that he does not use illicit drugs.  Family History  Problem Relation Age of Onset  . Stroke Father   . Heart attack Father   . Bipolar disorder Brother   . Heart attack Brother     General ROS: no n/v, + f/c, + diarrhea (now resolved), no numbness in RLE, wound in RLE closing.  Please see HPI. 12 point ROS o/w (-)  Blood pressure 120/82, pulse 101, temperature 97.8 F (36.6 C), temperature source Oral, resp. rate 18, height '5\' 8"'  (1.727 m), weight 89.359 kg (197 lb), SpO2  97 %. General appearance: alert, cooperative and no distress Eyes: negative findings: conjunctivae and sclerae normal and pupils equal, round, reactive to light and accomodation Throat: normal findings: oropharynx pink & moist without lesions or evidence of thrush Neck: no adenopathy and supple, symmetrical, trachea midline Lungs: clear to auscultation bilaterally Heart: regular rate and rhythm Abdomen: normal findings: bowel sounds normal and soft, non-tender Extremities: R thigh with VAC in place. R foot dressed, in boot.    Results for orders placed or performed during the hospital encounter of 01/05/15 (from the past 48 hour(s))  I-Stat CG4 Lactic Acid, ED     Status: Abnormal   Collection Time: 01/05/15  5:32 PM  Result Value Ref Range   Lactic Acid, Venous 2.22 (HH) 0.5 - 2.0 mmol/L   Comment NOTIFIED PHYSICIAN   Anaerobic culture     Status: None (Preliminary result)   Collection Time: 01/05/15  7:04 PM  Result Value Ref Range   Specimen Description WOUND    Special Requests RIGHT    Gram Stain      FEW WBC PRESENT, PREDOMINANTLY PMN RARE SQUAMOUS EPITHELIAL CELLS PRESENT NO ORGANISMS SEEN Performed at [facility]; CULTURE IN PROGRESS FOR 5 DAYS Performed at [facility]    Report Status PENDING   Culture, routine-abscess     Status: None (Preliminary result)   Collection Time: 01/05/15   7:09 PM  Result Value Ref Range   Specimen Description ABSCESS RIGHT THIGH    Special Requests NONE    Gram Stain PENDING    Culture      Culture reincubated for better growth Performed at [facility]    Report Status PENDING   Procalcitonin - Baseline     Status: None   Collection Time: 01/05/15  8:10 PM  Result Value Ref Range   Procalcitonin 0.15 ng/mL    Comment:        Interpretation: PCT (Procalcitonin) <= 0.5 ng/mL: Systemic infection (sepsis) is not likely. Local bacterial infection is possible. (NOTE)         ICU PCT Algorithm               Non ICU PCT Algorithm    ----------------------------     ------------------------------         PCT < 0.25 ng/mL                 PCT < 0.1 ng/mL     Stopping of antibiotics            Stopping of antibiotics       strongly encouraged.               strongly encouraged.    ----------------------------     ------------------------------       PCT level decrease by               PCT < 0.25 ng/mL       >= 80% from peak PCT       OR PCT 0.25 - 0.5 ng/mL          Stopping of antibiotics                                             encouraged.     Stopping of antibiotics  encouraged.    ----------------------------     ------------------------------       PCT level decrease by              PCT >= 0.25 ng/mL       < 80% from peak PCT        AND PCT >= 0.5 ng/mL            Continuin g antibiotics                                              encouraged.       Continuing antibiotics            encouraged.    ----------------------------     ------------------------------     PCT level increase compared          PCT > 0.5 ng/mL         with peak PCT AND          PCT >= 0.5 ng/mL             Escalation of antibiotics                                          strongly encouraged.      Escalation of antibiotics        strongly encouraged.   CBC     Status: Abnormal   Collection Time: 01/06/15  5:16 AM  Result Value Ref  Range   WBC 10.0 4.0 - 10.5 K/uL   RBC 3.38 (L) 4.22 - 5.81 MIL/uL   Hemoglobin 10.2 (L) 13.0 - 17.0 g/dL   HCT 30.1 (L) 39.0 - 52.0 %   MCV 89.1 78.0 - 100.0 fL   MCH 30.2 26.0 - 34.0 pg   MCHC 33.9 30.0 - 36.0 g/dL   RDW 13.0 11.5 - 15.5 %   Platelets 241 150 - 400 K/uL  Comprehensive metabolic panel     Status: Abnormal   Collection Time: 01/06/15  5:16 AM  Result Value Ref Range   Sodium 135 135 - 145 mmol/L   Potassium 3.4 (L) 3.5 - 5.1 mmol/L   Chloride 104 101 - 111 mmol/L   CO2 24 22 - 32 mmol/L   Glucose, Bld 145 (H) 65 - 99 mg/dL   BUN 10 6 - 20 mg/dL   Creatinine, Ser 0.82 0.61 - 1.24 mg/dL   Calcium 8.4 (L) 8.9 - 10.3 mg/dL   Total Protein 6.0 (L) 6.5 - 8.1 g/dL   Albumin 2.3 (L) 3.5 - 5.0 g/dL   AST 19 15 - 41 U/L   ALT 26 17 - 63 U/L   Alkaline Phosphatase 56 38 - 126 U/L   Total Bilirubin 0.5 0.3 - 1.2 mg/dL   GFR calc non Af Amer >60 >60 mL/min   GFR calc Af Amer >60 >60 mL/min    Comment: (NOTE) The eGFR has been calculated using the CKD EPI equation. This calculation has not been validated in all clinical situations. eGFR's persistently <60 mL/min signify possible Chronic Kidney Disease.    Anion gap 7 5 - 15  Basic metabolic panel     Status: Abnormal   Collection Time: 01/07/15  4:40 AM  Result Value Ref Range  Sodium 133 (L) 135 - 145 mmol/L   Potassium 3.2 (L) 3.5 - 5.1 mmol/L   Chloride 99 (L) 101 - 111 mmol/L   CO2 26 22 - 32 mmol/L   Glucose, Bld 154 (H) 65 - 99 mg/dL   BUN 7 6 - 20 mg/dL   Creatinine, Ser 0.76 0.61 - 1.24 mg/dL   Calcium 8.7 (L) 8.9 - 10.3 mg/dL   GFR calc non Af Amer >60 >60 mL/min   GFR calc Af Amer >60 >60 mL/min    Comment: (NOTE) The eGFR has been calculated using the CKD EPI equation. This calculation has not been validated in all clinical situations. eGFR's persistently <60 mL/min signify possible Chronic Kidney Disease.    Anion gap 8 5 - 15      Component Value Date/Time   SDES ABSCESS RIGHT THIGH  01/05/2015 1909   SPECREQUEST NONE 01/05/2015 1909   CULT  01/05/2015 1909    Culture reincubated for better growth Performed at [facility] PENDING 01/05/2015 1909   No results found. Recent Results (from the past 240 hour(s))  Blood culture (routine x 2)     Status: None (Preliminary result)   Collection Time: 01/05/15  2:48 PM  Result Value Ref Range Status   Specimen Description BLOOD RIGHT ARM  Final   Special Requests BOTTLES DRAWN AEROBIC AND ANAEROBIC 5CC  Final   Culture NO GROWTH 2 DAYS  Final   Report Status PENDING  Incomplete  Blood culture (routine x 2)     Status: None (Preliminary result)   Collection Time: 01/05/15  4:30 PM  Result Value Ref Range Status   Specimen Description BLOOD RIGHT ARM  Final   Special Requests BOTTLES DRAWN AEROBIC AND ANAEROBIC 10 CC  Final   Culture NO GROWTH 2 DAYS  Final   Report Status PENDING  Incomplete  Anaerobic culture     Status: None (Preliminary result)   Collection Time: 01/05/15  7:04 PM  Result Value Ref Range Status   Specimen Description WOUND  Final   Special Requests RIGHT  Final   Gram Stain   Final    FEW WBC PRESENT, PREDOMINANTLY PMN RARE SQUAMOUS EPITHELIAL CELLS PRESENT NO ORGANISMS SEEN Performed at [facility]    Culture   Final    NO ANAEROBES ISOLATED; CULTURE IN PROGRESS FOR 5 DAYS Performed at [facility]    Report Status PENDING  Incomplete  Culture, routine-abscess     Status: None (Preliminary result)   Collection Time: 01/05/15  7:09 PM  Result Value Ref Range Status   Specimen Description ABSCESS RIGHT THIGH  Final   Special Requests NONE  Final   Gram Stain PENDING  Incomplete   Culture   Final    Culture reincubated for better growth Performed at [facility]    Report Status PENDING  Incomplete      01/07/2015, 5:24 PM     LOS: 2 days    Records and images were personally reviewed where available.
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
## 2015-01-07 NOTE — Progress Notes (Signed)
    Subjective: 2 Days Post-Op Procedure(s) (LRB): IRRIGATION AND DEBRIDEMENT DISTAL THIGH (Right) Patient reports pain as 3 on 0-10 scale.   Denies CP or SOB.  Voiding without difficulty. Positive flatus. Objective: Vital signs in last 24 hours: Temp:  [98.4 F (36.9 C)-99.1 F (37.3 C)] 98.4 F (36.9 C) (09/19 0419) Pulse Rate:  [52-89] 83 (09/19 0419) Resp:  [16-17] 17 (09/19 0419) BP: (114)/(59-72) 114/59 mmHg (09/19 0419) SpO2:  [98 %-100 %] 100 % (09/19 0419)  Intake/Output from previous day: 09/18 0701 - 09/19 0700 In: 720 [P.O.:720] Out: -  Intake/Output this shift:    Labs:  Recent Labs  01/05/15 1305 01/06/15 0516  HGB 12.2* 10.2*    Recent Labs  01/05/15 1305 01/06/15 0516  WBC 10.5 10.0  RBC 4.10* 3.38*  HCT 36.2* 30.1*  PLT 298 241    Recent Labs  01/06/15 0516 01/07/15 0440  NA 135 133*  K 3.4* 3.2*  CL 104 99*  CO2 24 26  BUN 10 7  CREATININE 0.82 0.76  GLUCOSE 145* 154*  CALCIUM 8.4* 8.7*   No results for input(s): LABPT, INR in the last 72 hours.  Physical Exam: Neurologically intact No cellulitis present Compartment soft VAC functioning well No pain with ROM of hip/knee/ankle  Assessment/Plan: 2 Days Post-Op Procedure(s) (LRB): IRRIGATION AND DEBRIDEMENT DISTAL THIGH (Right) Will discuss case with Dr Kelly Splinter Continue irrigating VAC  Cx demonstrated MRSA  Dorma Altman D for Dr. Venita Lick [facility] [phone] 01/07/2015, 7:36 AM

## 2015-01-07 NOTE — Progress Notes (Addendum)
Inpatient Diabetes Program Recommendations  AACE/ADA: New Consensus Statement on Inpatient Glycemic Control (2015)  Target Ranges:  Prepandial:   less than 140 mg/dL      Peak postprandial:   less than 180 mg/dL (1-2 hours)      Critically ill patients:  140 - 180 mg/dL  Results for TAIKI, BUCKWALTER (MRN [id number]) as of 01/07/2015 09:40  Ref. Range 11/12/2014 21:14  Hemoglobin A1C Latest Ref Range: 4.8-5.6 % 5.8 (H)   Results for NICKOLES, GREGORI (MRN [id number]) as of 01/07/2015 09:40  Ref. Range 01/05/2015 13:05 01/06/2015 05:16 01/07/2015 04:40  Glucose Latest Ref Range: 65-99 mg/dL 782 (H) 956 (H) 213 (H)   Review of Glycemic Control  Diabetes history: No Outpatient Diabetes medications: None Current orders for Inpatient glycemic control: Metformin 500 mg QAM  Inpatient Diabetes Program Recommendations: HgbA1C: A1C 5.8% on 11/12/14 which meets ADA criteria for prediabetes. Once A1C 6.5% or greater then would meet criteria for DM dx. Agree with starting Metformin as ordered. Will plan to talk with patient.  Insulin-Correction: While inpatient, please consider ordering CBGs with Novolog sensitive correction scale ACHS. Diet: Please change diet to Carb Modified diet.  01/07/15@11 :4- Spoke with patient about elevated A1C. Patient reports that his father had Type 2 diabetes and was taking oral medications for diabetes up until he had a stroke and lost over 100 pounds at which point his diabetes was controlled without any DM medications. Patient states that no one has told him about his current glucose levels or the A1C. Discussed A1C results (5.8%) and explained what an A1C is and how it related to actual glucose values. Patient reports that he has lost about 30 pounds over the last 1-2 months because he was trying to lose weight. Discussed prediabetes and basic pathophysiology of DM Type 2. Explained how prediabetes can develop into diabetes. Reviewed goal glucose values and A1C. Patient does not  have a PCP and will need to establish care with a PCP for follow. Patient states that he lives with his father and he has access to his father's glucometer and testing supplies. Encouraged patient to check fasting glucose if he would be checking his glucose at home with his father's glucometer. Reviewed importance of maintaining good glycemic control to prevent long-term and short-term complications related to uncontrolled diabetes. Discussed impact of nutrition, exercise, stress, sickness, and medications on diabetes control.  Discussed carbohydrates, carbohydrate goals per day and meal, along with portion sizes. Provided handouts on Carb Modified diet. Patient reports that he does not follow any type of diet currently and drinks sugary drinks daily (sweet tea, orange juice, and regular soda). Encouraged patient to review information in more detail and to try to eliminate sugary drinks from diet and to begin following Carb Modified diet. Discussed importance of exercise and encouraged patient to try to exercise at least 30 minutes per day which will help glycemic control. Informed patient Living Well with Diabetes booklet would be ordered for him to review to gain more knowledge about diabetes. Patient verbalized understanding of information discussed and he states that he has no further questions at this time related to diabetes.    Thanks, Orlando Penner, RN, MSN, CCRN, CDE Diabetes Coordinator Inpatient Diabetes Program [phone] (Team Pager from 8am to 5pm) [phone] ([facility] office) [phone] [facility] [place] office) [phone] [facility] office)

## 2015-01-08 ENCOUNTER — Ambulatory Visit (HOSPITAL_COMMUNITY): Admission: RE | Admit: 2015-01-08 | Payer: Medicaid Other | Source: Ambulatory Visit | Admitting: Plastic Surgery

## 2015-01-08 ENCOUNTER — Inpatient Hospital Stay (HOSPITAL_COMMUNITY): Payer: Medicaid Other | Admitting: Anesthesiology

## 2015-01-08 ENCOUNTER — Encounter (HOSPITAL_COMMUNITY): Payer: Self-pay | Admitting: Plastic Surgery

## 2015-01-08 ENCOUNTER — Encounter (HOSPITAL_COMMUNITY)
Admission: EM | Disposition: A | Discharge status: Home / Self Care | Payer: Self-pay | Source: Home / Self Care | Attending: Orthopedic Surgery

## 2015-01-08 HISTORY — PX: I&D EXTREMITY: SHX5045

## 2015-01-08 HISTORY — PX: APPLICATION OF A-CELL OF EXTREMITY: SHX6303

## 2015-01-08 LAB — BASIC METABOLIC PANEL
ANION GAP: 7 (ref 5–15)
BUN: 8 mg/dL (ref 6–20)
CHLORIDE: 101 mmol/L (ref 101–111)
CO2: 27 mmol/L (ref 22–32)
Calcium: 8.8 mg/dL — ABNORMAL LOW (ref 8.9–10.3)
Creatinine, Ser: 0.78 mg/dL (ref 0.61–1.24)
GFR calc Af Amer: >60 mL/min (ref >60)
Glucose, Bld: 112 mg/dL — ABNORMAL HIGH (ref 65–99)
POTASSIUM: 3.9 mmol/L (ref 3.5–5.1)
SODIUM: 135 mmol/L (ref 135–145)

## 2015-01-08 LAB — HEPATITIS C ANTIBODY: HCV Ab: <0.1 s/co ratio (ref 0.0–0.9)

## 2015-01-08 LAB — HEMOGLOBIN A1C
Hgb A1c MFr Bld: 5.8 % — ABNORMAL HIGH (ref 4.8–5.6)
Mean Plasma Glucose: 120 mg/dL

## 2015-01-08 LAB — HIV ANTIBODY (ROUTINE TESTING W REFLEX): HIV SCREEN 4TH GENERATION: NONREACTIVE

## 2015-01-08 SURGERY — IRRIGATION AND DEBRIDEMENT EXTREMITY
Anesthesia: General | Laterality: Right

## 2015-01-08 MED ORDER — MIDAZOLAM HCL 2 MG/2ML IJ SOLN
INTRAMUSCULAR | Status: AC
  Filled 2015-01-08: qty 4

## 2015-01-08 MED ORDER — LIDOCAINE HCL (CARDIAC) 20 MG/ML IV SOLN
INTRAVENOUS | Status: DC | PRN
  Administered 2015-01-08: 100 mg via INTRAVENOUS

## 2015-01-08 MED ORDER — ONDANSETRON HCL 4 MG/2ML IJ SOLN
INTRAMUSCULAR | Status: DC | PRN
  Administered 2015-01-08: 4 mg via INTRAVENOUS

## 2015-01-08 MED ORDER — LACTATED RINGERS IV SOLN
INTRAVENOUS | Status: DC
  Administered 2015-01-09: 02:00:00 via INTRAVENOUS

## 2015-01-08 MED ORDER — SODIUM CHLORIDE 0.9 % IR SOLN
Status: DC | PRN
  Administered 2015-01-08: 500 mL

## 2015-01-08 MED ORDER — SODIUM CHLORIDE 0.9 % IR SOLN
Status: DC | PRN
  Administered 2015-01-08: 1000 mL

## 2015-01-08 MED ORDER — PROPOFOL 10 MG/ML IV BOLUS
INTRAVENOUS | Status: DC | PRN
  Administered 2015-01-08: 200 mg via INTRAVENOUS

## 2015-01-08 MED ORDER — HYDROMORPHONE HCL 1 MG/ML IJ SOLN: 0.2500 mg | INTRAMUSCULAR | Status: DC | PRN

## 2015-01-08 MED ORDER — MIDAZOLAM HCL 5 MG/5ML IJ SOLN
INTRAMUSCULAR | Status: DC | PRN
  Administered 2015-01-08: 2 mg via INTRAVENOUS

## 2015-01-08 MED ORDER — PROPOFOL 10 MG/ML IV BOLUS
INTRAVENOUS | Status: AC
  Filled 2015-01-08: qty 20

## 2015-01-08 MED ORDER — CHLORHEXIDINE GLUCONATE 4 % EX LIQD
Freq: Once | CUTANEOUS | Status: AC
  Administered 2015-01-08: 13:00:00 via TOPICAL
  Filled 2015-01-08 (×2): qty 15

## 2015-01-08 MED ORDER — LACTATED RINGERS IV SOLN
INTRAVENOUS | Status: DC | PRN
  Administered 2015-01-08 (×2): via INTRAVENOUS

## 2015-01-08 MED ORDER — PHENYLEPHRINE HCL 10 MG/ML IJ SOLN
INTRAMUSCULAR | Status: DC | PRN
  Administered 2015-01-08 (×3): 80 ug via INTRAVENOUS
  Administered 2015-01-08: 40 ug via INTRAVENOUS
  Administered 2015-01-08 (×3): 80 ug via INTRAVENOUS

## 2015-01-08 MED ORDER — PROMETHAZINE HCL 25 MG/ML IJ SOLN: 6.2500 mg | INTRAMUSCULAR | Status: DC | PRN

## 2015-01-08 MED ORDER — FENTANYL CITRATE (PF) 250 MCG/5ML IJ SOLN
INTRAMUSCULAR | Status: AC
  Filled 2015-01-08: qty 5

## 2015-01-08 MED ORDER — FENTANYL CITRATE (PF) 100 MCG/2ML IJ SOLN
INTRAMUSCULAR | Status: DC | PRN
  Administered 2015-01-08 (×2): 50 ug via INTRAVENOUS

## 2015-01-08 MED ORDER — PHENYLEPHRINE 40 MCG/ML (10ML) SYRINGE FOR IV PUSH (FOR BLOOD PRESSURE SUPPORT)
PREFILLED_SYRINGE | INTRAVENOUS | Status: AC
  Filled 2015-01-08: qty 10

## 2015-01-08 SURGICAL SUPPLY — 53 items
BAG DECANTER FOR FLEXI CONT (MISCELLANEOUS) ×4 IMPLANT
BANDAGE ELASTIC 3 VELCRO ST LF (GAUZE/BANDAGES/DRESSINGS) ×4 IMPLANT
BANDAGE ELASTIC 4 VELCRO ST LF (GAUZE/BANDAGES/DRESSINGS) ×2 IMPLANT
BANDAGE ELASTIC 6 VELCRO ST LF (GAUZE/BANDAGES/DRESSINGS) ×2 IMPLANT
BLADE SURG ROTATE 9660 (MISCELLANEOUS) IMPLANT
BNDG GAUZE ELAST 4 BULKY (GAUZE/BANDAGES/DRESSINGS) ×4 IMPLANT
CANISTER SUCTION 2500CC (MISCELLANEOUS) ×3 IMPLANT
CANISTER WOUND CARE 500ML ATS (WOUND CARE) ×2 IMPLANT
CHLORAPREP W/TINT 26ML (MISCELLANEOUS) IMPLANT
COVER SURGICAL LIGHT HANDLE (MISCELLANEOUS) ×3 IMPLANT
DRAPE EXTREMITY T 121X128X90 (DRAPE) IMPLANT
DRAPE ORTHO SPLIT 77X108 STRL (DRAPES)
DRAPE SURG ORHT 6 SPLT 77X108 (DRAPES) IMPLANT
DRSG ADAPTIC 3X8 NADH LF (GAUZE/BANDAGES/DRESSINGS) ×4 IMPLANT
DRSG PAD ABDOMINAL 8X10 ST (GAUZE/BANDAGES/DRESSINGS) ×2 IMPLANT
DRSG VAC ATS LRG SENSATRAC (GAUZE/BANDAGES/DRESSINGS) IMPLANT
DRSG VAC ATS MED SENSATRAC (GAUZE/BANDAGES/DRESSINGS) IMPLANT
DRSG VAC ATS SM SENSATRAC (GAUZE/BANDAGES/DRESSINGS) ×2 IMPLANT
ELECT CAUTERY BLADE 6.4 (BLADE) ×3 IMPLANT
ELECT REM PT RETURN 9FT ADLT (ELECTROSURGICAL) ×3
ELECTRODE REM PT RTRN 9FT ADLT (ELECTROSURGICAL) ×1 IMPLANT
GAUZE SPONGE 4X4 12PLY STRL (GAUZE/BANDAGES/DRESSINGS) ×2 IMPLANT
GLOVE BIO SURGEON STRL SZ 6.5 (GLOVE) ×5 IMPLANT
GLOVE BIO SURGEONS STRL SZ 6.5 (GLOVE) ×3
GLOVE BIOGEL PI IND STRL 8 (GLOVE) IMPLANT
GLOVE BIOGEL PI INDICATOR 8 (GLOVE) ×2
GOWN STRL REUS W/ TWL LRG LVL3 (GOWN DISPOSABLE) ×3 IMPLANT
GOWN STRL REUS W/TWL LRG LVL3 (GOWN DISPOSABLE) ×9
HANDPIECE INTERPULSE COAX TIP (DISPOSABLE)
KIT BASIN OR (CUSTOM PROCEDURE TRAY) ×3 IMPLANT
KIT ROOM TURNOVER OR (KITS) ×3 IMPLANT
MATRIX SURGICAL PSM 7X10CM (Tissue) ×2 IMPLANT
MICROMATRIX 1000MG (Tissue) ×6 IMPLANT
NS IRRIG 1000ML POUR BTL (IV SOLUTION) ×3 IMPLANT
PACK GENERAL/GYN (CUSTOM PROCEDURE TRAY) ×1 IMPLANT
PACK ORTHO EXTREMITY (CUSTOM PROCEDURE TRAY) ×3 IMPLANT
PAD ABD 8X10 STRL (GAUZE/BANDAGES/DRESSINGS) IMPLANT
PAD ARMBOARD 7.5X6 YLW CONV (MISCELLANEOUS) ×6 IMPLANT
PAD NEG PRESSURE SENSATRAC (MISCELLANEOUS) IMPLANT
SET HNDPC FAN SPRY TIP SCT (DISPOSABLE) IMPLANT
SOLUTION PARTIC MCRMTRX 1000MG (Tissue) IMPLANT
STOCKINETTE IMPERVIOUS 9X36 MD (GAUZE/BANDAGES/DRESSINGS) IMPLANT
STOCKINETTE IMPERVIOUS LG (DRAPES) ×2 IMPLANT
SUT SILK 4 0 PS 2 (SUTURE) ×4 IMPLANT
SUT VIC AB 5-0 PS2 18 (SUTURE) ×6 IMPLANT
SYR BULB IRRIGATION 50ML (SYRINGE) ×2 IMPLANT
TOWEL OR 17X24 6PK STRL BLUE (TOWEL DISPOSABLE) ×1 IMPLANT
TOWEL OR 17X26 10 PK STRL BLUE (TOWEL DISPOSABLE) ×3 IMPLANT
TUBE CONNECTING 12'X1/4 (SUCTIONS) ×2
TUBE CONNECTING 12X1/4 (SUCTIONS) ×3 IMPLANT
UNDERPAD 30X30 INCONTINENT (UNDERPADS AND DIAPERS) ×3 IMPLANT
WATER STERILE IRR 1000ML POUR (IV SOLUTION) IMPLANT
YANKAUER SUCT BULB TIP NO VENT (SUCTIONS) ×5 IMPLANT
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
## 2015-01-08 NOTE — Progress Notes (Signed)
    Subjective: 3 Days Post-Op Procedure(s) (LRB): IRRIGATION AND DEBRIDEMENT DISTAL THIGH (Right) Patient reports pain as 2 on 0-10 scale.   Denies CP or SOB.  Voiding without difficulty. Positive flatus. Objective: Vital signs in last 24 hours: Temp:  [97.8 F (36.6 C)-98.6 F (37 C)] 98.6 F (37 C) (09/19 2250) Pulse Rate:  [99-101] 99 (09/19 2250) Resp:  [18] 18 (09/19 2250) BP: (111-120)/(62-82) 111/62 mmHg (09/19 2250) SpO2:  [97 %] 97 % (09/19 2250)  Intake/Output from previous day: 09/19 0701 - 09/20 0700 In: 2530 [P.O.:960; I.V.:1020; IV Piggyback:550] Out: 250 [Drains:250] Intake/Output this shift:    Labs:  Recent Labs  01/05/15 1305 01/06/15 0516  HGB 12.2* 10.2*    Recent Labs  01/05/15 1305 01/06/15 0516  WBC 10.5 10.0  RBC 4.10* 3.38*  HCT 36.2* 30.1*  PLT 298 241    Recent Labs  01/07/15 0440 01/08/15 0546  NA 133* 135  K 3.2* 3.9  CL 99* 101  CO2 26 27  BUN 7 8  CREATININE 0.76 0.78  GLUCOSE 154* 112*  CALCIUM 8.7* 8.8*   No results for input(s): LABPT, INR in the last 72 hours.  Physical Exam: Neurologically intact Intact pulses distally Compartment soft  Assessment/Plan: 3 Days Post-Op Procedure(s) (LRB): IRRIGATION AND DEBRIDEMENT DISTAL THIGH (Right) Plan on return to OR today with Dr Kelly Splinter Will transfer care to plastic team No further ortho intervention required at this time   [facility] D for Dr. Venita Lick [facility] [phone] 01/08/2015, 7:57 AM

## 2015-01-08 NOTE — Transfer of Care (Signed)
Immediate Anesthesia Transfer of Care Note  Patient: [name] [name]  Procedure(s) Performed: Procedure(s): IRRIGATION AND DEBRIDEMENT OF RIGHT FOOT AND RIGHT KNEE WOUND AND APPICATION OF WOUND VAC FOR RIGHT KNEE (Right) APPLICATION OF A-CELL in Knee and Foot  (Right)  Patient Location: PACU  Anesthesia Type:General  Level of Consciousness: awake, alert  and oriented  Airway & Oxygen Therapy: Patient Spontanous Breathing  Post-op Assessment: Report given to RN, Post -op Vital signs reviewed and stable and Patient moving all extremities X 4  Post vital signs: Reviewed and stable  Last Vitals:  Filed Vitals:   01/07/15 2250  BP: 111/62  Pulse: 99  Temp: 37 C  Resp: 18    Complications: No apparent anesthesia complications
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
## 2015-01-08 NOTE — Progress Notes (Addendum)
INFECTIOUS DISEASE PROGRESS NOTE  ID: [name] [name] is a 49 y.o. male with  Principal Problem:   Sepsis affecting skin Active Problems:   Fracture of multiple toes   Depression   Chronic foot ulcer   Injury, superficial, hip, thigh, leg, or ankle with infection   Abscess  Subjective: Resting quietly  Abtx:  Anti-infectives    Start     Dose/Rate Route Frequency Ordered Stop   01/08/15 0600  ceFAZolin (ANCEF) IVPB 2 g/50 mL premix     2 g 100 mL/hr over 30 Minutes Intravenous To [facility]Stay [facility] 01/07/15 1747 01/09/15 0600   01/07/15 2200  cefTAZidime (FORTAZ) injection 2 g  Status:  Discontinued     2 g Intramuscular 3 times per day 01/07/15 1753 01/07/15 1759   01/07/15 2200  cefTAZidime (FORTAZ) 2 g in dextrose 5 % 50 mL IVPB     2 g 100 mL/hr over 30 Minutes Intravenous 3 times per day 01/07/15 1759     01/07/15 1400  vancomycin (VANCOCIN) IVPB 1000 mg/200 mL premix     1,000 mg 200 mL/hr over 60 Minutes Intravenous Every 12 hours 01/07/15 1337     01/06/15 0600  vancomycin (VANCOCIN) IVPB 1000 mg/200 mL premix  Status:  Discontinued     1,000 mg 200 mL/hr over 60 Minutes Intravenous Every 12 hours 01/05/15 1955 01/06/15 2238   01/06/15 0500  vancomycin (VANCOCIN) IVPB 1000 mg/200 mL premix  Status:  Discontinued     1,000 mg 200 mL/hr over 60 Minutes Intravenous Every 12 hours 01/05/15 1625 01/05/15 1707   01/06/15 0100  piperacillin-tazobactam (ZOSYN) IVPB 3.375 g  Status:  Discontinued     3.375 g 12.5 mL/hr over 240 Minutes Intravenous 3 times per day 01/05/15 1625 01/05/15 1707   01/05/15 2000  piperacillin-tazobactam (ZOSYN) IVPB 3.375 g  Status:  Discontinued     3.375 g 12.5 mL/hr over 240 Minutes Intravenous 3 times per day 01/05/15 1954 01/07/15 1753   01/05/15 1700  vancomycin (VANCOCIN) 1,750 mg in sodium chloride 0.9 % 500 mL IVPB  Status:  Discontinued     1,750 mg 250 mL/hr over 120 Minutes Intravenous  Once 01/05/15 1619 01/05/15 1707   01/05/15  1615  piperacillin-tazobactam (ZOSYN) IVPB 3.375 g  Status:  Discontinued     3.375 g 100 mL/hr over 30 Minutes Intravenous  Once 01/05/15 1605 01/05/15 1707   01/05/15 1615  vancomycin (VANCOCIN) IVPB 1000 mg/200 mL premix  Status:  Discontinued     1,000 mg 200 mL/hr over 60 Minutes Intravenous  Once 01/05/15 1605 01/05/15 1619      Medications:  Scheduled: .  ceFAZolin (ANCEF) IV  2 g Intravenous To SS-Surg  . cefTAZidime (FORTAZ)  IV  2 g Intravenous 3 times per day  . metFORMIN  500 mg Oral Q breakfast  . potassium chloride  20 mEq Oral Daily  . vancomycin  1,000 mg Intravenous Q12H    Objective: Vital signs in last 24 hours: Temp:  [97.8 F (36.6 C)-98.6 F (37 C)] 98.6 F (37 C) (09/19 2250) Pulse Rate:  [99-101] 99 (09/19 2250) Resp:  [18] 18 (09/19 2250) BP: (111-120)/(62-82) 111/62 mmHg (09/19 2250) SpO2:  [97 %] 97 % (09/19 2250)   General appearance: no distress Extremities: RLE vac on thigh. boot on.   Lab Results  Recent Labs  01/05/15 1305 01/06/15 0516 01/07/15 0440 01/08/15 0546  WBC 10.5 10.0  --   --   HGB 12.2* 10.2*  --   --  HCT 36.2* 30.1*  --   --   NA 134* 135 133* 135  K 3.6 3.4* 3.2* 3.9  CL 102 104 99* 101  CO2 BUN CREATININE 0.88 0.82 0.76 0.78   Liver Panel  Recent Labs  01/05/15 1305 01/06/15 0516  PROT 7.6 6.0*  ALBUMIN 2.9* 2.3*  AST 25 19  ALT 29 26  ALKPHOS 78 56  BILITOT 0.5 0.5   Sedimentation Rate No results for input(s): ESRSEDRATE in the last 72 hours. C-Reactive Protein No results for input(s): CRP in the last 72 hours.  Microbiology: Recent Results (from the past 240 hour(s))  Blood culture (routine x 2)     Status: None (Preliminary result)   Collection Time: 01/05/15  2:48 PM  Result Value Ref Range Status   Specimen Description BLOOD RIGHT ARM  Final   Special Requests BOTTLES DRAWN AEROBIC AND ANAEROBIC 5CC  Final   Culture NO GROWTH 2 DAYS  Final   Report Status PENDING   Incomplete  Blood culture (routine x 2)     Status: None (Preliminary result)   Collection Time: 01/05/15  4:30 PM  Result Value Ref Range Status   Specimen Description BLOOD RIGHT ARM  Final   Special Requests BOTTLES DRAWN AEROBIC AND ANAEROBIC 10 CC  Final   Culture NO GROWTH 2 DAYS  Final   Report Status PENDING  Incomplete  Anaerobic culture     Status: None (Preliminary result)   Collection Time: 01/05/15  7:04 PM  Result Value Ref Range Status   Specimen Description WOUND  Final   Special Requests RIGHT  Final   Gram Stain   Final    FEW WBC PRESENT, PREDOMINANTLY PMN RARE SQUAMOUS EPITHELIAL CELLS PRESENT NO ORGANISMS SEEN Performed at [facility]    Culture   Final    NO ANAEROBES ISOLATED; CULTURE IN PROGRESS FOR 5 DAYS Performed at [facility]    Report Status PENDING  Incomplete  Culture, routine-abscess     Status: None (Preliminary result)   Collection Time: 01/05/15  7:09 PM  Result Value Ref Range Status   Specimen Description ABSCESS RIGHT THIGH  Final   Special Requests NONE  Final   Gram Stain PENDING  Incomplete   Culture   Final    MODERATE STAPHYLOCOCCUS AUREUS Note: RIFAMPIN AND GENTAMICIN SHOULD NOT BE USED AS SINGLE DRUGS FOR TREATMENT OF STAPH INFECTIONS. Performed at [facility]    Report Status PENDING  Incomplete    Studies/Results: No results found.   Assessment/Plan: R thigh Abscess R foot open wound, osteomyelitis Returning to OR today Change zosyn, to ceftaz (he doesn't need anaerobe coverage) Await Cx final- so far staph aureus  Will stop ceftaz Case mgmt- home health may not be a feasible. he may be a good candidate for ortiavancin?  DM2 Appreciate Diabetic educator Follow FSG  Health Maintenance HIV (-) and Hep C (-)  Therapeutic Drug monitoring His Cr is stable on vanco.  Will f/u  trough with pharmacy.          [name] [name] Infectious Diseases (pager) [phone] [email or web address] 01/08/2015, 11:36 AM  LOS: 3 days
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
## 2015-01-08 NOTE — Anesthesia Preprocedure Evaluation (Signed)
Anesthesia Evaluation  Patient identified by MRN, date of birth, ID band Patient awake    Reviewed: Allergy & Precautions, NPO status , Patient's Chart, lab work & pertinent test results  Airway Mallampati: II  TM Distance: >3 FB Neck ROM: Full    Dental no notable dental hx.    Pulmonary Current Smoker,    Pulmonary exam normal breath sounds clear to auscultation       Cardiovascular negative cardio ROS Normal cardiovascular exam Rhythm:Regular Rate:Normal     Neuro/Psych negative neurological ROS  negative psych ROS   GI/Hepatic negative GI ROS, Neg liver ROS,   Endo/Other  negative endocrine ROS  Renal/GU negative Renal ROS  negative genitourinary   Musculoskeletal negative musculoskeletal ROS (+)   Abdominal   Peds negative pediatric ROS (+)  Hematology negative hematology ROS (+)   Anesthesia Other Findings   Reproductive/Obstetrics negative OB ROS                             Anesthesia Physical Anesthesia Plan  ASA: II  Anesthesia Plan: General   Post-op Pain Management:    Induction: Intravenous  Airway Management Planned: LMA  Additional Equipment:   Intra-op Plan:   Post-operative Plan: Extubation in OR  Informed Consent: I have reviewed the patients History and Physical, chart, labs and discussed the procedure including the risks, benefits and alternatives for the proposed anesthesia with the patient or authorized representative who has indicated his/her understanding and acceptance.   Dental advisory given  Plan Discussed with: CRNA and Surgeon  Anesthesia Plan Comments:         Anesthesia Quick Evaluation  

## 2015-01-08 NOTE — Care Management Note (Addendum)
Case Management Note  Patient Details  Name: Tlaloc Taddei MRN: [id number] Date of Birth: November 25, 1965  Subjective/Objective:   49 yr old patient with right thigh hematoma. To undergo I & D of hematoma 01/08/15.                 Action/Plan:  Case manager discussed with Dr. Ninetta Lights possibility of patient receiving IV antibiotic Aritivansin every ten days at short stay. Order form placed on patient's chart. Discharge date and plan to be determined. [facility] may not accept patient back as a client. Case manager will continue to monitor.   Expected Discharge Date:                  Expected Discharge Plan:   To be determined  In-House Referral:  NA  Discharge planning Services  CM Consult  Post Acute Care Choice:    Choice offered to:     DME Arranged:    DME Agency:     HH Arranged:    HH Agency:     Status of Service:  In process, will continue to follow  Medicare Important Message Given:    Date Medicare IM Given:    Medicare IM give by:    Date Additional Medicare IM Given:    Additional Medicare Important Message give by:     If discussed at Long Length of Stay Meetings, dates discussed:    Additional Comments:  Durenda Guthrie, RN 01/08/2015, 11:46 AM

## 2015-01-08 NOTE — Brief Op Note (Signed)
01/05/2015 - 01/08/2015  2:39 PM  PATIENT:  [name] [name]  49 y.o. male  PRE-OPERATIVE DIAGNOSIS:  WOUND OF RIGHT FOOT   POST-OPERATIVE DIAGNOSIS:  * No post-op diagnosis entered *  PROCEDURE:  Procedure(s): IRRIGATION AND DEBRIDEMENT OF RIGHT FOOT AND RIGHT KNEE WOUND AND APPICATION OF WOUND VAC FOR RIGHT KNEE (Right) APPLICATION OF A-CELL (Right)  SURGEON:  Surgeon(s) and Role:    * Claire Sanger, DO - Primary  PHYSICIAN ASSISTANT: none  ASSISTANTS: none   ANESTHESIA:   general  EBL:  Total I/O In: 1000 [I.V.:1000] Out: 30 [Blood:30]  BLOOD ADMINISTERED:none  DRAINS: none   LOCAL MEDICATIONS USED:  NONE  SPECIMEN:  No Specimen  DISPOSITION OF SPECIMEN:  N/A  COUNTS:  YES  TOURNIQUET:  * No tourniquets in log *  DICTATION: .Dragon Dictation  PLAN OF CARE: Admit to inpatient   PATIENT DISPOSITION:  PACU - hemodynamically stable.   Delay start of Pharmacological VTE agent (>24hrs) due to surgical blood loss or risk of bleeding: no

## 2015-01-08 NOTE — Interval H&P Note (Signed)
History and Physical Interval Note:  01/08/2015 12:20 PM  [name] [name]  has presented today for surgery, with the diagnosis of WOUND OF RIGHT FOOT   The various methods of treatment have been discussed with the patient and family. After consideration of risks, benefits and other options for treatment, the patient has consented to  Procedure(s): IRRIGATION AND DEBRIDEMENT OF RIGHT FOOT AND RIGHT KNEE WOUND AND APPICATION OF WOUND VAC FOR RIGHT KNEE (Right) APPLICATION OF A-CELL (Right) as a surgical intervention .  The patient's history has been reviewed, patient examined, no change in status, stable for surgery.  I have reviewed the patient's chart and labs.  Questions were answered to the patient's satisfaction.     SANGER,CLAIRE

## 2015-01-08 NOTE — Anesthesia Postprocedure Evaluation (Signed)
  Anesthesia Post-op Note  Patient: [name] [name]  Procedure(s) Performed: Procedure(s) (LRB): IRRIGATION AND DEBRIDEMENT OF RIGHT FOOT AND RIGHT KNEE WOUND AND APPICATION OF WOUND VAC FOR RIGHT KNEE (Right) APPLICATION OF A-CELL in Knee and Foot  (Right)  Patient Location: PACU  Anesthesia Type: General  Level of Consciousness: awake and alert   Airway and Oxygen Therapy: Patient Spontanous Breathing  Post-op Pain: mild  Post-op Assessment: Post-op Vital signs reviewed, Patient's Cardiovascular Status Stable, Respiratory Function Stable, Patent Airway and No signs of Nausea or vomiting  Last Vitals:  Filed Vitals:   01/08/15 1509  BP: 111/50  Pulse: 92  Temp:   Resp: 11    Post-op Vital Signs: stable   Complications: No apparent anesthesia complications

## 2015-01-08 NOTE — Anesthesia Procedure Notes (Signed)
Procedure Name: LMA Insertion Date/Time: 01/08/2015 1:57 PM Performed by: Rise Patience T Pre-anesthesia Checklist: Patient identified, Emergency Drugs available, Suction available and Patient being monitored Patient Re-evaluated:Patient Re-evaluated prior to inductionOxygen Delivery Method: Circle system utilized Preoxygenation: Pre-oxygenation with 100% oxygen Intubation Type: IV induction LMA: LMA inserted LMA Size: 5.0 Number of attempts: 1 Placement Confirmation: positive ETCO2 and breath sounds checked- equal and bilateral Tube secured with: Tape Dental Injury: Teeth and Oropharynx as per pre-operative assessment

## 2015-01-09 ENCOUNTER — Encounter (HOSPITAL_COMMUNITY): Payer: Self-pay | Admitting: Plastic Surgery

## 2015-01-09 DIAGNOSIS — B9562 Methicillin resistant Staphylococcus aureus infection as the cause of diseases classified elsewhere: Secondary | ICD-10-CM

## 2015-01-09 LAB — CULTURE, ROUTINE-ABSCESS

## 2015-01-09 LAB — BASIC METABOLIC PANEL
ANION GAP: 8 (ref 5–15)
BUN: 7 mg/dL (ref 6–20)
CHLORIDE: 97 mmol/L — AB (ref 101–111)
CO2: 29 mmol/L (ref 22–32)
Calcium: 8.9 mg/dL (ref 8.9–10.3)
Creatinine, Ser: 0.83 mg/dL (ref 0.61–1.24)
Glucose, Bld: 94 mg/dL (ref 65–99)
POTASSIUM: 5 mmol/L (ref 3.5–5.1)
SODIUM: 134 mmol/L — AB (ref 135–145)

## 2015-01-09 LAB — VANCOMYCIN, TROUGH: VANCOMYCIN TR: 7 ug/mL — AB (ref 10.0–20.0)

## 2015-01-09 MED ORDER — VANCOMYCIN HCL IN DEXTROSE 1-5 GM/200ML-% IV SOLN
1000.0000 mg | Freq: Three times a day (TID) | INTRAVENOUS | Status: DC
  Administered 2015-01-10 – 2015-01-11 (×4): 1000 mg via INTRAVENOUS
  Filled 2015-01-09 (×8): qty 200

## 2015-01-09 NOTE — Progress Notes (Signed)
INFECTIOUS DISEASE PROGRESS NOTE  ID: [name] [name] is a 49 y.o. male with  Principal Problem:   Sepsis affecting skin Active Problems:   Fracture of multiple toes   Depression   Chronic foot ulcer   Injury, superficial, hip, thigh, leg, or ankle with infection   Abscess  Subjective: Without complaints.  Less leg swelling.   Abtx:  Anti-infectives    Start     Dose/Rate Route Frequency Ordered Stop   01/09/15 2200  vancomycin (VANCOCIN) IVPB 1000 mg/200 mL premix     1,000 mg 200 mL/hr over 60 Minutes Intravenous Every 8 hours 01/09/15 1652     01/08/15 1420  polymyxin B 500,000 Units, bacitracin 50,000 Units in sodium chloride irrigation 0.9 % 500 mL irrigation  Status:  Discontinued       As needed 01/08/15 1420 01/08/15 1451   01/08/15 1419  polymyxin B 500,000 Units, bacitracin 50,000 Units in sodium chloride irrigation 0.9 % 500 mL irrigation  Status:  Discontinued       As needed 01/08/15 1420 01/08/15 1451   01/08/15 0600  ceFAZolin (ANCEF) IVPB 2 g/50 mL premix     2 g 100 mL/hr over 30 Minutes Intravenous To ShortStay Surgical 01/07/15 1747 01/08/15 1400   01/07/15 2200  cefTAZidime (FORTAZ) injection 2 g  Status:  Discontinued     2 g Intramuscular 3 times per day 01/07/15 1753 01/07/15 1759   01/07/15 2200  cefTAZidime (FORTAZ) 2 g in dextrose 5 % 50 mL IVPB  Status:  Discontinued     2 g 100 mL/hr over 30 Minutes Intravenous 3 times per day 01/07/15 1759 01/08/15 1142   01/07/15 1400  vancomycin (VANCOCIN) IVPB 1000 mg/200 mL premix  Status:  Discontinued     1,000 mg 200 mL/hr over 60 Minutes Intravenous Every 12 hours 01/07/15 1337 01/09/15 1652   01/06/15 0600  vancomycin (VANCOCIN) IVPB 1000 mg/200 mL premix  Status:  Discontinued     1,000 mg 200 mL/hr over 60 Minutes Intravenous Every 12 hours 01/05/15 1955 01/06/15 2238   01/06/15 0500  vancomycin (VANCOCIN) IVPB 1000 mg/200 mL premix  Status:  Discontinued     1,000 mg 200 mL/hr over 60 Minutes  Intravenous Every 12 hours 01/05/15 1625 01/05/15 1707   01/06/15 0100  piperacillin-tazobactam (ZOSYN) IVPB 3.375 g  Status:  Discontinued     3.375 g 12.5 mL/hr over 240 Minutes Intravenous 3 times per day 01/05/15 1625 01/05/15 1707   01/05/15 2000  piperacillin-tazobactam (ZOSYN) IVPB 3.375 g  Status:  Discontinued     3.375 g 12.5 mL/hr over 240 Minutes Intravenous 3 times per day 01/05/15 1954 01/07/15 1753   01/05/15 1700  vancomycin (VANCOCIN) 1,750 mg in sodium chloride 0.9 % 500 mL IVPB  Status:  Discontinued     1,750 mg 250 mL/hr over 120 Minutes Intravenous  Once 01/05/15 1619 01/05/15 1707   01/05/15 1615  piperacillin-tazobactam (ZOSYN) IVPB 3.375 g  Status:  Discontinued     3.375 g 100 mL/hr over 30 Minutes Intravenous  Once 01/05/15 1605 01/05/15 1707   01/05/15 1615  vancomycin (VANCOCIN) IVPB 1000 mg/200 mL premix  Status:  Discontinued     1,000 mg 200 mL/hr over 60 Minutes Intravenous  Once 01/05/15 1605 01/05/15 1619      Medications:  Scheduled: . metFORMIN  500 mg Oral Q breakfast  . potassium chloride  20 mEq Oral Daily  . vancomycin  1,000 mg Intravenous Q8H    Objective:  Vital signs in last 24 hours: Temp:  [98.1 F (36.7 C)-98.7 F (37.1 C)] 98.1 F (36.7 C) (09/21 1537) Pulse Rate:  [95-103] 95 (09/21 1537) Resp:  [18-20] 18 (09/21 1537) BP: (109-120)/(50-83) 120/83 mmHg (09/21 1537) SpO2:  [97 %-100 %] 97 % (09/21 1537)   General appearance: alert, cooperative and no distress Incision/Wound: thigh is less swollen. VAC in place. Non-tender.   Lab Results  Recent Labs  01/08/15 0546 01/09/15 0535  NA 135 134*  K 3.9 5.0  CL 101 97*  CO2 27 29  BUN 8 7  CREATININE 0.78 0.83   Liver Panel No results for input(s): PROT, ALBUMIN, AST, ALT, ALKPHOS, BILITOT, BILIDIR, IBILI in the last 72 hours. Sedimentation Rate No results for input(s): ESRSEDRATE in the last 72 hours. C-Reactive Protein No results for input(s): CRP in the last 72  hours.  Microbiology: Recent Results (from the past 240 hour(s))  Blood culture (routine x 2)     Status: None (Preliminary result)   Collection Time: 01/05/15  2:48 PM  Result Value Ref Range Status   Specimen Description BLOOD RIGHT ARM  Final   Special Requests BOTTLES DRAWN AEROBIC AND ANAEROBIC 5CC  Final   Culture NO GROWTH 4 DAYS  Final   Report Status PENDING  Incomplete  Blood culture (routine x 2)     Status: None (Preliminary result)   Collection Time: 01/05/15  4:30 PM  Result Value Ref Range Status   Specimen Description BLOOD RIGHT ARM  Final   Special Requests BOTTLES DRAWN AEROBIC AND ANAEROBIC 10 CC  Final   Culture NO GROWTH 4 DAYS  Final   Report Status PENDING  Incomplete  Anaerobic culture     Status: None (Preliminary result)   Collection Time: 01/05/15  7:04 PM  Result Value Ref Range Status   Specimen Description WOUND  Final   Special Requests RIGHT  Final   Gram Stain   Final    FEW WBC PRESENT, PREDOMINANTLY PMN RARE SQUAMOUS EPITHELIAL CELLS PRESENT NO ORGANISMS SEEN Performed at [facility]    Culture   Final    NO ANAEROBES ISOLATED; CULTURE IN PROGRESS FOR 5 DAYS Performed at [facility]    Report Status PENDING  Incomplete  Culture, routine-abscess     Status: None   Collection Time: 01/05/15  7:09 PM  Result Value Ref Range Status   Specimen Description ABSCESS RIGHT THIGH  Final   Special Requests NONE  Final   Gram Stain   Final    FEW WBC PRESENT, PREDOMINANTLY PMN RARE SQUAMOUS EPITHELIAL CELLS PRESENT NO ORGANISMS SEEN Performed at [facility]    Culture   Final    MODERATE METHICILLIN RESISTANT STAPHYLOCOCCUS AUREUS Note: RIFAMPIN AND GENTAMICIN SHOULD NOT BE USED AS SINGLE DRUGS FOR TREATMENT OF STAPH INFECTIONS. This organism DOES NOT demonstrate inducible Clindamycin resistance in vitro. CRITICAL RESULT CALLED TO, READ BACK BY AND VERIFIED WITH: COREY H. AT  12:50PM ON 01/09/15 HAJAM Performed at  [facility]    Report Status 01/09/2015 FINAL  Final   Organism ID, Bacteria METHICILLIN RESISTANT STAPHYLOCOCCUS AUREUS  Final      Susceptibility   Methicillin resistant staphylococcus aureus - MIC*    CLINDAMYCIN <=0.25 SENSITIVE Sensitive     ERYTHROMYCIN >=8 RESISTANT Resistant     GENTAMICIN <=0.5 SENSITIVE Sensitive     LEVOFLOXACIN 4 INTERMEDIATE Intermediate     OXACILLIN >=4 RESISTANT Resistant     RIFAMPIN <=0.5 SENSITIVE  Sensitive     TRIMETH/SULFA <=10 SENSITIVE Sensitive     VANCOMYCIN 1 SENSITIVE Sensitive     TETRACYCLINE <=1 SENSITIVE Sensitive     * MODERATE METHICILLIN RESISTANT STAPHYLOCOCCUS AUREUS    Studies/Results: No results found.   Assessment/Plan: R thigh Abscess R foot open wound, osteomyelitis Cx final- MRSA Case mgmt- home health may not be a feasible. Although he will need some help for his wound vac.   Would give him oritavancin x 2 doses after d/c, 10 days apart.   Available as needed.   DM2 Appreciate Diabetic educator Follow [facility]  Health Maintenance HIV (-) and Hep C (-)  Therapeutic Drug monitoring His Cr is stable on vanco.  Will f/u trough with pharmacy- will ask for dose change.   Total days of antibiotics: 4 vanco         Johny Sax Infectious Diseases (pager) [phone] [email or web address] 01/09/2015, 5:04 PM  LOS: 4 days

## 2015-01-09 NOTE — Progress Notes (Signed)
1 Day Post-Op  Subjective: Patient overall doing much better.  The wound had improved with no purulence remaining.  Acell and VAC placed.  He states the [facility] is at home and his mom is bringing it in to the hospital.    Objective: Vital signs in last 24 hours: Temp:  [97.4 F (36.3 C)-98.7 F (37.1 C)] 98.6 F (37 C) (09/21 0535) Pulse Rate:  [92-103] 100 (09/21 0535) Resp:  [11-20] 20 (09/21 0535) BP: (109-121)/(50-71) 115/60 mmHg (09/21 0535) SpO2:  [96 %-100 %] 98 % (09/21 0535) Last BM Date: 01/07/15  Intake/Output from previous day: 09/20 0701 - 09/21 0700 In: 1297.7 [I.V.:1097.7; IV Piggyback:200] Out: 55 [Drains:25; Blood:30] Intake/Output this shift: Total I/O In: -  Out: 75 [Drains:75]  General appearance: alert, cooperative and no distress Incision/Wound: Still red and swollen but improved.  Lab Results:  No results for input(s): WBC, HGB, HCT, PLT in the last 72 hours. BMET  Recent Labs  01/08/15 0546 01/09/15 0535  NA 135 134*  K 3.9 5.0  CL 101 97*  CO2 27 29  GLUCOSE 112* 94  BUN 8 7  CREATININE 0.78 0.83  CALCIUM 8.8* 8.9   PT/INR No results for input(s): LABPROT, INR in the last 72 hours. ABG No results for input(s): PHART, HCO3 in the last 72 hours.  Invalid input(s): PCO2, PO2  Studies/Results: No results found.  Anti-infectives: Anti-infectives    Start     Dose/Rate Route Frequency Ordered Stop   01/08/15 1420  polymyxin B 500,000 Units, bacitracin 50,000 Units in sodium chloride irrigation 0.9 % 500 mL irrigation  Status:  Discontinued       As needed 01/08/15 1420 01/08/15 1451   01/08/15 1419  polymyxin B 500,000 Units, bacitracin 50,000 Units in sodium chloride irrigation 0.9 % 500 mL irrigation  Status:  Discontinued       As needed 01/08/15 1420 01/08/15 1451   01/08/15 0600  ceFAZolin (ANCEF) IVPB 2 g/50 mL premix     2 g 100 mL/hr over 30 Minutes Intravenous To ShortStay Surgical 01/07/15 1747 01/08/15 1400   01/07/15 2200   cefTAZidime (FORTAZ) injection 2 g  Status:  Discontinued     2 g Intramuscular 3 times per day 01/07/15 1753 01/07/15 1759   01/07/15 2200  cefTAZidime (FORTAZ) 2 g in dextrose 5 % 50 mL IVPB  Status:  Discontinued     2 g 100 mL/hr over 30 Minutes Intravenous 3 times per day 01/07/15 1759 01/08/15 1142   01/07/15 1400  vancomycin (VANCOCIN) IVPB 1000 mg/200 mL premix     1,000 mg 200 mL/hr over 60 Minutes Intravenous Every 12 hours 01/07/15 1337     01/06/15 0600  vancomycin (VANCOCIN) IVPB 1000 mg/200 mL premix  Status:  Discontinued     1,000 mg 200 mL/hr over 60 Minutes Intravenous Every 12 hours 01/05/15 1955 01/06/15 2238   01/06/15 0500  vancomycin (VANCOCIN) IVPB 1000 mg/200 mL premix  Status:  Discontinued     1,000 mg 200 mL/hr over 60 Minutes Intravenous Every 12 hours 01/05/15 1625 01/05/15 1707   01/06/15 0100  piperacillin-tazobactam (ZOSYN) IVPB 3.375 g  Status:  Discontinued     3.375 g 12.5 mL/hr over 240 Minutes Intravenous 3 times per day 01/05/15 1625 01/05/15 1707   01/05/15 2000  piperacillin-tazobactam (ZOSYN) IVPB 3.375 g  Status:  Discontinued     3.375 g 12.5 mL/hr over 240 Minutes Intravenous 3 times per day 01/05/15 1954 01/07/15 1753  01/05/15 1700  vancomycin (VANCOCIN) 1,750 mg in sodium chloride 0.9 % 500 mL IVPB  Status:  Discontinued     1,750 mg 250 mL/hr over 120 Minutes Intravenous  Once 01/05/15 1619 01/05/15 1707   01/05/15 1615  piperacillin-tazobactam (ZOSYN) IVPB 3.375 g  Status:  Discontinued     3.375 g 100 mL/hr over 30 Minutes Intravenous  Once 01/05/15 1605 01/05/15 1707   01/05/15 1615  vancomycin (VANCOCIN) IVPB 1000 mg/200 mL premix  Status:  Discontinued     1,000 mg 200 mL/hr over 60 Minutes Intravenous  Once 01/05/15 1605 01/05/15 1619      Assessment/Plan: s/p Procedure(s): IRRIGATION AND DEBRIDEMENT OF RIGHT FOOT AND RIGHT KNEE WOUND AND APPICATION OF WOUND VAC FOR RIGHT KNEE (Right) APPLICATION OF A-CELL in Knee and Foot   (Right) Plan for discharge tomorrow after changing the VAC over to the home vac. Awaiting ID recommendations for home antibiotics.  LOS: 4 days      Peggye Form 01/09/2015

## 2015-01-09 NOTE — Progress Notes (Signed)
ANTIBIOTIC CONSULT NOTE - FOLLOW UP  Pharmacy Consult for Vancomycin Indication: MRSA wound  Allergies  Allergen Reactions  . Hydrocodone-Acetaminophen Itching    Patient Measurements: Height:  (172.7 cm) Weight: 197 lb (89.359 kg) IBW/kg (Calculated) : 68.4  Vital Signs: Temp: 98.1 F (36.7 C) (09/21 1537) Temp Source: Oral (09/21 1537) BP: 120/83 mmHg (09/21 1537) Pulse Rate: 95 (09/21 1537) Intake/Output from previous day: 09/20 0701 - 09/21 0700 In: 1297.7 [I.V.:1097.7; IV Piggyback:200] Out: 55 [Drains:25; Blood:30] Intake/Output from this shift: Total I/O In: -  Out: 75 [Drains:75]  Labs:  Recent Labs  01/07/15 0440 01/08/15 0546 01/09/15 0535  CREATININE 0.76 0.78 0.83   Estimated Creatinine Clearance: 118.2 mL/min (by C-G formula based on Cr of 0.83).  Recent Labs  01/09/15 1526  VANCOTROUGH 7*     Microbiology: Recent Results (from the past 720 hour(s))  Anaerobic culture     Status: None   Collection Time: 12/12/14  2:53 PM  Result Value Ref Range Status   Specimen Description WOUND FOOT RIGHT  Final   Special Requests PT ON ANCEF  Final   Gram Stain   Final    RARE WBC PRESENT, PREDOMINANTLY PMN NO SQUAMOUS EPITHELIAL CELLS SEEN NO ORGANISMS SEEN Performed at [facility]    Culture   Final    MULTIPLE ORGANISMS PRESENT, NONE PREDOMINANT Performed at [facility]    Report Status 12/17/2014 FINAL  Final  Wound culture     Status: None   Collection Time: 12/12/14  2:53 PM  Result Value Ref Range Status   Specimen Description WOUND FOOT RIGHT  Final   Special Requests PT ON ANCEF  Final   Gram Stain   Final    RARE WBC PRESENT,BOTH PMN AND MONONUCLEAR NO SQUAMOUS EPITHELIAL CELLS SEEN NO ORGANISMS SEEN Performed at [facility]    Culture   Final    FEW METHICILLIN RESISTANT STAPHYLOCOCCUS AUREUS Note: RIFAMPIN AND GENTAMICIN SHOULD NOT BE USED AS SINGLE DRUGS FOR TREATMENT OF STAPH INFECTIONS. This  organism DOES NOT demonstrate inducible Clindamycin resistance in vitro. Performed at [facility]    Report Status 12/16/2014 FINAL  Final   Organism ID, Bacteria METHICILLIN RESISTANT STAPHYLOCOCCUS AUREUS  Final      Susceptibility   Methicillin resistant staphylococcus aureus - MIC*    CLINDAMYCIN <=0.25 SENSITIVE Sensitive     ERYTHROMYCIN >=8 RESISTANT Resistant     GENTAMICIN <=0.5 SENSITIVE Sensitive     LEVOFLOXACIN 4 INTERMEDIATE Intermediate     OXACILLIN >=4 RESISTANT Resistant     RIFAMPIN <=0.5 SENSITIVE Sensitive     TRIMETH/SULFA <=10 SENSITIVE Sensitive     VANCOMYCIN 1 SENSITIVE Sensitive     TETRACYCLINE <=1 SENSITIVE Sensitive     * FEW METHICILLIN RESISTANT STAPHYLOCOCCUS AUREUS  Blood culture (routine x 2)     Status: None (Preliminary result)   Collection Time: 01/05/15  2:48 PM  Result Value Ref Range Status   Specimen Description BLOOD RIGHT ARM  Final   Special Requests BOTTLES DRAWN AEROBIC AND ANAEROBIC 5CC  Final   Culture NO GROWTH 4 DAYS  Final   Report Status PENDING  Incomplete  Blood culture (routine x 2)     Status: None (Preliminary result)   Collection Time: 01/05/15  4:30 PM  Result Value Ref Range Status   Specimen Description BLOOD RIGHT ARM  Final   Special Requests BOTTLES DRAWN AEROBIC AND ANAEROBIC 10 CC  Final   Culture NO GROWTH  4 DAYS  Final   Report Status PENDING  Incomplete  Anaerobic culture     Status: None (Preliminary result)   Collection Time: 01/05/15  7:04 PM  Result Value Ref Range Status   Specimen Description WOUND  Final   Special Requests RIGHT  Final   Gram Stain   Final    FEW WBC PRESENT, PREDOMINANTLY PMN RARE SQUAMOUS EPITHELIAL CELLS PRESENT NO ORGANISMS SEEN Performed at [facility]    Culture   Final    NO ANAEROBES ISOLATED; CULTURE IN PROGRESS FOR 5 DAYS Performed at [facility]    Report Status PENDING  Incomplete  Culture, routine-abscess     Status: None   Collection  Time: 01/05/15  7:09 PM  Result Value Ref Range Status   Specimen Description ABSCESS RIGHT THIGH  Final   Special Requests NONE  Final   Gram Stain   Final    FEW WBC PRESENT, PREDOMINANTLY PMN RARE SQUAMOUS EPITHELIAL CELLS PRESENT NO ORGANISMS SEEN Performed at [facility]    Culture   Final    MODERATE METHICILLIN RESISTANT STAPHYLOCOCCUS AUREUS Note: RIFAMPIN AND GENTAMICIN SHOULD NOT BE USED AS SINGLE DRUGS FOR TREATMENT OF STAPH INFECTIONS. This organism DOES NOT demonstrate inducible Clindamycin resistance in vitro. CRITICAL RESULT CALLED TO, READ BACK BY AND VERIFIED WITH: COREY H. AT  12:50PM ON 01/09/15 HAJAM Performed at [facility]    Report Status 01/09/2015 FINAL  Final   Organism ID, Bacteria METHICILLIN RESISTANT STAPHYLOCOCCUS AUREUS  Final      Susceptibility   Methicillin resistant staphylococcus aureus - MIC*    CLINDAMYCIN <=0.25 SENSITIVE Sensitive     ERYTHROMYCIN >=8 RESISTANT Resistant     GENTAMICIN <=0.5 SENSITIVE Sensitive     LEVOFLOXACIN 4 INTERMEDIATE Intermediate     OXACILLIN >=4 RESISTANT Resistant     RIFAMPIN <=0.5 SENSITIVE Sensitive     TRIMETH/SULFA <=10 SENSITIVE Sensitive     VANCOMYCIN 1 SENSITIVE Sensitive     TETRACYCLINE <=1 SENSITIVE Sensitive     * MODERATE METHICILLIN RESISTANT STAPHYLOCOCCUS AUREUS    Anti-infectives    Start     Dose/Rate Route Frequency Ordered Stop   01/08/15 1420  polymyxin B 500,000 Units, bacitracin 50,000 Units in sodium chloride irrigation 0.9 % 500 mL irrigation  Status:  Discontinued       As needed 01/08/15 1420 01/08/15 1451   01/08/15 1419  polymyxin B 500,000 Units, bacitracin 50,000 Units in sodium chloride irrigation 0.9 % 500 mL irrigation  Status:  Discontinued       As needed 01/08/15 1420 01/08/15 1451   01/08/15 0600  ceFAZolin (ANCEF) IVPB 2 g/50 mL premix     2 g 100 mL/hr over 30 Minutes Intravenous To ShortStay Surgical 01/07/15 1747 01/08/15 1400   01/07/15 2200   cefTAZidime (FORTAZ) injection 2 g  Status:  Discontinued     2 g Intramuscular 3 times per day 01/07/15 1753 01/07/15 1759   01/07/15 2200  cefTAZidime (FORTAZ) 2 g in dextrose 5 % 50 mL IVPB  Status:  Discontinued     2 g 100 mL/hr over 30 Minutes Intravenous 3 times per day 01/07/15 1759 01/08/15 1142   01/07/15 1400  vancomycin (VANCOCIN) IVPB 1000 mg/200 mL premix     1,000 mg 200 mL/hr over 60 Minutes Intravenous Every 12 hours 01/07/15 1337     01/06/15 0600  vancomycin (VANCOCIN) IVPB 1000 mg/200 mL premix  Status:  Discontinued  1,000 mg 200 mL/hr over 60 Minutes Intravenous Every 12 hours 01/05/15 1955 01/06/15 2238   01/06/15 0500  vancomycin (VANCOCIN) IVPB 1000 mg/200 mL premix  Status:  Discontinued     1,000 mg 200 mL/hr over 60 Minutes Intravenous Every 12 hours 01/05/15 1625 01/05/15 1707   01/06/15 0100  piperacillin-tazobactam (ZOSYN) IVPB 3.375 g  Status:  Discontinued     3.375 g 12.5 mL/hr over 240 Minutes Intravenous 3 times per day 01/05/15 1625 01/05/15 1707   01/05/15 2000  piperacillin-tazobactam (ZOSYN) IVPB 3.375 g  Status:  Discontinued     3.375 g 12.5 mL/hr over 240 Minutes Intravenous 3 times per day 01/05/15 1954 01/07/15 1753   01/05/15 1700  vancomycin (VANCOCIN) 1,750 mg in sodium chloride 0.9 % 500 mL IVPB  Status:  Discontinued     1,750 mg 250 mL/hr over 120 Minutes Intravenous  Once 01/05/15 1619 01/05/15 1707   01/05/15 1615  piperacillin-tazobactam (ZOSYN) IVPB 3.375 g  Status:  Discontinued     3.375 g 100 mL/hr over 30 Minutes Intravenous  Once 01/05/15 1605 01/05/15 1707   01/05/15 1615  vancomycin (VANCOCIN) IVPB 1000 mg/200 mL premix  Status:  Discontinued     1,000 mg 200 mL/hr over 60 Minutes Intravenous  Once 01/05/15 1605 01/05/15 1619      Assessment: 48 yo M on Vancomycin day #5 for MRSA LE wound.  Vancomycin trough = 7, although drawn ~ 1.5 hours late.  Still calculate that it would be subtherapeutic.  Renal function is stable  with CrCl ~ 100 ml.min.  Goal of Therapy:  Vancomycin trough level 10-15 mcg/ml  Plan:  Increase Vancomycin to 1gm IV q8h Monitor renal function, duration of therapy, VT as steady state  [employer], Pharm.D., BCPS Clinical Pharmacist Pager [phone] 01/09/2015 4:51 PM     Hammons, Judie Bonus 01/09/2015,4:47 PM

## 2015-01-09 NOTE — Care Management Note (Addendum)
Case Management Note  Patient Details  Name: Yahshua Thibault MRN: [id number] Date of Birth: February 20, 1966  Subjective/Objective:    49 yr old male s/p I & D of right foot wound and I & D of right thigh with wound vac placement to thigh wound.               Action/Plan: Case manager spoke with patient concerning need for wound vac for home. Patient states he already has vac at home. Case manager contacted Ailene Ravel with [employer] concerning what is needed. Op notes with measurements and pertinent information was faxed to [facility] at [phone]. Form for physicians signature has been placed on chart. Case manager will continue to monitor.  Signed VAC form faxed to Ailene Ravel at [phone], received confirmation. Contacted Paige and notified her of incoming fax.     Expected Discharge Date:                   Expected Discharge Plan:     In-House Referral:  NA  Discharge planning Services  CM Consult  Post Acute Care Choice:    Choice offered to:     DME Arranged:   wound vac DME Agency:   [facility]  HH Arranged:    HH Agency:     Status of Service:  In process, will continue to follow  Medicare Important Message Given:    Date Medicare IM Given:    Medicare IM give by:    Date Additional Medicare IM Given:    Additional Medicare Important Message give by:     If discussed at Long Length of Stay Meetings, dates discussed:    Additional Comments:  Durenda Guthrie, RN 01/09/2015, 10:35 AM

## 2015-01-09 NOTE — Clinical Documentation Improvement (Addendum)
Dr. Kelly Splinter,  Regarding the surgical procedures to 2 separate sites dated 01/08/15:  To assist with accurate code assignment, please document the appropriate information regarding the 3 items below:  1)  the TYPE of debridement (excisional or non-excisional)          (this information cannot be inferred on the basis of the instrument used)   2)  the TYPE of tissue removed at both operative sites.    3)  Please include if both areas were debrided back to healthy bleeding tissue.   (please document your response in the progress notes or as an addendum to the operative note, not on the query form itself.)  Please exercise your independent, professional judgment when responding. A specific answer is not anticipated or expected.   Thank You, Jerral Ralph  RN BSN CCDS [phone] Health Information Management [place]

## 2015-01-09 NOTE — Progress Notes (Signed)
CRITICAL VALUE ALERT  Critical value received:  Abscess culture  Date of notification:  9/21  Time of notification:  12:59 PM   Critical value read back:Yes.    Nurse who received alert:  Shelbie Ammons  MD notified (1st page):  Dr. Kelly Splinter  Time of first page:  12:58  MD notified (2nd page):   Time of second page: 1:20 pm  Responding MD:  Dr. Leonie Green PA  Time MD responded:  1:58, will notify Dr. Ninetta Lights with infectious disease per PA

## 2015-01-10 DIAGNOSIS — X58XXXD Exposure to other specified factors, subsequent encounter: Secondary | ICD-10-CM

## 2015-01-10 DIAGNOSIS — S91301D Unspecified open wound, right foot, subsequent encounter: Secondary | ICD-10-CM

## 2015-01-10 LAB — CULTURE, BLOOD (ROUTINE X 2)
Culture: NO GROWTH
Culture: NO GROWTH

## 2015-01-10 MED ORDER — VANCOMYCIN HCL 1000 MG IV SOLR: 1000.0000 mg | Freq: Three times a day (TID) | INTRAVENOUS | Status: AC

## 2015-01-10 MED ORDER — HYDROCODONE-ACETAMINOPHEN 5-325 MG PO TABS: 1.0000 | ORAL_TABLET | Freq: Four times a day (QID) | ORAL | Status: DC | PRN

## 2015-01-10 MED ORDER — SODIUM CHLORIDE 0.9 % IJ SOLN
10.0000 mL | INTRAMUSCULAR | Status: DC | PRN
  Administered 2015-01-11: 10 mL
  Filled 2015-01-10: qty 40

## 2015-01-10 NOTE — Progress Notes (Signed)
Approximately around 2130 - 2145, this RN noticed that this patient was not in his room. At shift report, it was understood by this RN that the patient could not leave his room because he is MRSA positive per MD. Security was called to locate the patient. When the patient came back to the unit, it was explained to him that he could not leave his room because of his MRSA in his wound and would place others including staff, patients and visitors at risk. Security also came to unit to explain the patient that he cannot roam the halls of the hospital because of his contact precautions for MRSA.   The patient keeps disconnecting the wound vac when he leaves the unit to go outside. This RN explained to this patient that he should not disconnect the wound vac because it is helping draw the fluid away from his wound. Without the suction, fluid can settle within the wound and possibly cause other issues.   Notified AC and let her know that this patient is leaving the floor without doctor's orders. The [facility] told me to notify the MD and ask them how far they wanted to take this situation. MD was called and explained the situation to her. She gave verbal orders for one of the following, ativan, xanax, or valium. MD told this RN to offer one of them to him. MD also told me to tell the patient that she wanted him to have a good night sleep so he can be discharged once his mother brings the wound vac from home on 9/22. MD cautioned this RN not to have any confrontations with this patient. Patient declined the above medications. Patient has been staying in his room since this incident happened. Will continue to monitor.

## 2015-01-10 NOTE — Progress Notes (Signed)
Diagnosis: R foot open wound, osteomyelitis Culture Result: MRSA  Allergies  Allergen Reactions  . Hydrocodone-Acetaminophen Itching    Discharge antibiotics: Vancomycin 1g IVPB q8h Duration: 30 days End Date: February 09, 2015  Tyler Per Protocol: Labs weekly while on IV antibiotics: _X_ CBC with differential _X_ CMP _X_ CRP _X_ ESR _X_ Vancomycin trough  Fax weekly labs to [phone]  Clinic Follow Up Appt: 1 month [place], [employer]

## 2015-01-10 NOTE — Progress Notes (Signed)
Peripherally Inserted Central Catheter/Midline Placement  The IV Nurse has discussed with the patient and/or persons authorized to consent for the patient, the purpose of this procedure and the potential benefits and risks involved with this procedure.  The benefits include less needle sticks, lab draws from the catheter and patient may be discharged home with the catheter.  Risks include, but not limited to, infection, bleeding, blood clot (thrombus formation), and puncture of an artery; nerve damage and irregular heat beat.  Alternatives to this procedure were also discussed.  PICC/Midline Placement Documentation  PICC / Midline Single Lumen 01/10/15 PICC Right Basilic 41 cm 0 cm (Active)  Indication for Insertion or Continuance of Line Home intravenous therapies (PICC only) 01/10/2015  6:38 PM  Exposed Catheter (cm) 0 cm 01/10/2015  6:38 PM  Site Assessment Clean;Dry;Intact 01/10/2015  6:38 PM  Line Status Flushed;Saline locked;Blood return noted 01/10/2015  6:38 PM  Dressing Type Transparent 01/10/2015  6:38 PM  Dressing Status Clean;Dry;Intact;Antimicrobial disc in place 01/10/2015  6:38 PM  Line Care Connections checked and tightened 01/10/2015  6:38 PM  Line Adjustment (NICU/IV Team Only) No 01/10/2015  6:38 PM  Dressing Intervention New dressing 01/10/2015  6:38 PM  Dressing Change Due 01/17/15 01/10/2015  6:38 PM       Elliot Dally 01/10/2015, 6:39 PM

## 2015-01-10 NOTE — Progress Notes (Signed)
Contacted Medicaid, oritavancin is not covered. Spoke with CM Chiropodist, advised to try additional home care companies. Spoke with patient, he is willing to have a PICC and have IV antibiotics at home again. Contacted Marveen Reeks at [facility] infusion company. Explained previous challenges with home infusion, he asked that I fax information and vancomycin order and they would consider taking case. Faxed H and P, order for vancomycin, op notes and med rec. Spoke with Inetta Fermo in intake at [facility] they will take the case. [facility] will be able to start service with the 01/11/15 evening dose of vancomycin. Contacted Shaun Rayburn PA and requested PICC order. CM will continue to follow.

## 2015-01-10 NOTE — Progress Notes (Signed)
2 Days Post-Op  Subjective: Overall doing well,  Swelling and redness has improved.  Home VAC connected.    Objective: Vital signs in last 24 hours: Temp:  [98.1 F (36.7 C)-99 F (37.2 C)] 98.5 F (36.9 C) (09/22 0455) Pulse Rate:  [84-95] 84 (09/22 0455) Resp:  [18-20] 18 (09/22 0455) BP: (119-132)/(58-85) 119/58 mmHg (09/22 0455) SpO2:  [95 %-98 %] 95 % (09/22 0455) Last BM Date: 01/09/15  Intake/Output from previous day: 09/21 0701 - 09/22 0700 In: 1120 [P.O.:720; IV Piggyback:400] Out: 75 [Drains:75] Intake/Output this shift: Total I/O In: 240 [P.O.:240] Out: 150 [Drains:150]  General appearance: alert, cooperative and no distress Incision/Wound:  Lab Results:  No results for input(s): WBC, HGB, HCT, PLT in the last 72 hours. BMET  Recent Labs  01/08/15 0546 01/09/15 0535  NA 135 134*  K 3.9 5.0  CL 101 97*  CO2 27 29  GLUCOSE 112* 94  BUN 8 7  CREATININE 0.78 0.83  CALCIUM 8.8* 8.9   PT/INR No results for input(s): LABPROT, INR in the last 72 hours. ABG No results for input(s): PHART, HCO3 in the last 72 hours.  Invalid input(s): PCO2, PO2  Studies/Results: No results found.  Anti-infectives: Anti-infectives    Start     Dose/Rate Route Frequency Ordered Stop   01/09/15 2200  vancomycin (VANCOCIN) IVPB 1000 mg/200 mL premix     1,000 mg 200 mL/hr over 60 Minutes Intravenous Every 8 hours 01/09/15 1652     01/08/15 1420  polymyxin B 500,000 Units, bacitracin 50,000 Units in sodium chloride irrigation 0.9 % 500 mL irrigation  Status:  Discontinued       As needed 01/08/15 1420 01/08/15 1451   01/08/15 1419  polymyxin B 500,000 Units, bacitracin 50,000 Units in sodium chloride irrigation 0.9 % 500 mL irrigation  Status:  Discontinued       As needed 01/08/15 1420 01/08/15 1451   01/08/15 0600  ceFAZolin (ANCEF) IVPB 2 g/50 mL premix     2 g 100 mL/hr over 30 Minutes Intravenous To ShortStay Surgical 01/07/15 1747 01/08/15 1400   01/07/15 2200   cefTAZidime (FORTAZ) injection 2 g  Status:  Discontinued     2 g Intramuscular 3 times per day 01/07/15 1753 01/07/15 1759   01/07/15 2200  cefTAZidime (FORTAZ) 2 g in dextrose 5 % 50 mL IVPB  Status:  Discontinued     2 g 100 mL/hr over 30 Minutes Intravenous 3 times per day 01/07/15 1759 01/08/15 1142   01/07/15 1400  vancomycin (VANCOCIN) IVPB 1000 mg/200 mL premix  Status:  Discontinued     1,000 mg 200 mL/hr over 60 Minutes Intravenous Every 12 hours 01/07/15 1337 01/09/15 1652   01/06/15 0600  vancomycin (VANCOCIN) IVPB 1000 mg/200 mL premix  Status:  Discontinued     1,000 mg 200 mL/hr over 60 Minutes Intravenous Every 12 hours 01/05/15 1955 01/06/15 2238   01/06/15 0500  vancomycin (VANCOCIN) IVPB 1000 mg/200 mL premix  Status:  Discontinued     1,000 mg 200 mL/hr over 60 Minutes Intravenous Every 12 hours 01/05/15 1625 01/05/15 1707   01/06/15 0100  piperacillin-tazobactam (ZOSYN) IVPB 3.375 g  Status:  Discontinued     3.375 g 12.5 mL/hr over 240 Minutes Intravenous 3 times per day 01/05/15 1625 01/05/15 1707   01/05/15 2000  piperacillin-tazobactam (ZOSYN) IVPB 3.375 g  Status:  Discontinued     3.375 g 12.5 mL/hr over 240 Minutes Intravenous 3 times per day 01/05/15 1954  01/07/15 1753   01/05/15 1700  vancomycin (VANCOCIN) 1,750 mg in sodium chloride 0.9 % 500 mL IVPB  Status:  Discontinued     1,750 mg 250 mL/hr over 120 Minutes Intravenous  Once 01/05/15 1619 01/05/15 1707   01/05/15 1615  piperacillin-tazobactam (ZOSYN) IVPB 3.375 g  Status:  Discontinued     3.375 g 100 mL/hr over 30 Minutes Intravenous  Once 01/05/15 1605 01/05/15 1707   01/05/15 1615  vancomycin (VANCOCIN) IVPB 1000 mg/200 mL premix  Status:  Discontinued     1,000 mg 200 mL/hr over 60 Minutes Intravenous  Once 01/05/15 1605 01/05/15 1619      Assessment/Plan: s/p Procedure(s): IRRIGATION AND DEBRIDEMENT OF RIGHT FOOT AND RIGHT KNEE WOUND AND APPICATION OF WOUND VAC FOR RIGHT KNEE  (Right) APPLICATION OF A-CELL in Knee and Foot  (Right) Discharge today if antibiotics arranged.  Home VAC will be changed at the wound care center Monday.  LOS: 5 days    [name] [name] 01/10/2015

## 2015-01-11 LAB — ANAEROBIC CULTURE

## 2015-01-11 MED ORDER — HEPARIN SOD (PORK) LOCK FLUSH 100 UNIT/ML IV SOLN
250.0000 [IU] | INTRAVENOUS | Status: AC | PRN
  Administered 2015-01-11: 250 [IU]

## 2015-01-11 NOTE — Progress Notes (Signed)
Called wound center at [facility] to confirm appointment for Monday 01/14/15 at 8:45, Pt Reminded

## 2015-01-11 NOTE — Progress Notes (Signed)
This RN alerted to pt's room by IV pump alarm. Pt had again disconnected PICC IV tubing, stating that he was not "sitting around with this thing attached to his arm." This RN attempted to reason with pt, referring back to conversation and agreement made earlier in the night regarding IV team members being the only and safest people to disconnect tubing per hospital policy. Pt stated that it "didn't matter, they don't do nothing but rub it with alcohol and I can do that myself." Pt appears to be very agitated during this encounter. This RN again attempted to put emphasis on infection control and the overall goal of keeping him healthy and safe. Pt demeanor and body language continued to express frustration and agitation towards subject. IV pump turned off by this RN. Consulting civil engineer notified.    Pt seen leaving unit approximately 10 minutes after above incident. Nursing will continue to monitor.

## 2015-01-11 NOTE — Progress Notes (Addendum)
Faxed PIC insertion note and d/c summary to [facility]. Received confirmation. Contacted Tina at [facility] care, confirmed that they will be providing 5pm dose today of vancomycin. Patient informed.

## 2015-01-11 NOTE — Progress Notes (Signed)
Pt noted to have left the unit by nursing staff. IV pump had been turned off, and pump and pole had been left in bathroom. PICC line IV tubing had been disconnected by pt, security notified. Security informed this RN that they had been instructed not to attempt to locate pt or attempt to return pt to room. AC notified of incident as well. MD on call notified. Spoke with Dr. Kelly Splinter; updated on situation. Expressed concerns that pt was disconnecting newly placed PICC tubing, potentially increasing risk of infection, that pt was still MRSA positive, and leaving room and unit at will.   MD advised this RN to offer pt  PO valium in attempt to increase muscle relaxation and for anti-anxiety. MD also advised this RN that if pt continued to leave unit, staff was "okay to not stop him." MD asked that nursing staff express to pt importance of not disconnecting IV tubing unless done so by IV team member and that although leaving the unit is against medical advice, IV tubing must remain connected and infusing if done so.    This RN spoke with pt along side Consulting civil engineer. Pt declined PO valium, stating that he did not need medication for either muscle relaxation or anxiety. Pt stated he understood reason for not disconnecting IV tubing and would not do so unless IV team member did so for him.   Nursing will continue to monitor.

## 2015-01-11 NOTE — Discharge Instructions (Addendum)
Keep VAC connected. Call Wound Center with questions. Appointment in wound center for [facility] change on Monday.  Bring extra VAC supplies (sponge and cannister). No smoking. Keep leg elevated.

## 2015-01-11 NOTE — Discharge Summary (Signed)
Physician Discharge Summary  Patient ID: [name] [name] MRN: [id number] DOB/AGE: 07-12-1965 49 y.o.  Admit date: 01/05/2015 Discharge date: 01/11/2015  Admission Diagnoses:  Discharge Diagnoses:  Principal Problem:   Sepsis affecting skin Active Problems:   Fracture of multiple toes   Depression   Chronic foot ulcer   Injury, superficial, hip, thigh, leg, or ankle with infection   Abscess   Discharged Condition: good  Hospital Course: The patient was admitted and taken to the OR by Ortho for evacuation of infected hematoma.  A VAC was placed and IV antibiotics started.  He was sen by ID who is following and managing his antibiotics.  Plastic surgery was then consulted for wound management.  He was taken to the OR on 9/20 for further irrigation and debridement.  There was marked improvement in the wound.  Acell and a VAC was placed.  There were challenges with arranging follow up and outpatient management.  Through the strong efforts of case management follow up was arranged with home health for the antibiotics and wound care at the wound care center.  Consults: ID, orthopedic surgery and IM and plastic surgery  Significant Diagnostic Studies: microbiology: wound culture: positive for MRSA  Treatments: IV hydration, antibiotics: vancomycin and surgery: I and D.  Discharge Exam: Blood pressure 121/49, pulse 84, temperature 97.5 F (36.4 C), temperature source Oral, resp. rate 19, height  (1.727 m), weight 89.359 kg (197 lb), SpO2 99 %. General appearance: alert, cooperative and no distress Resp: clear Skin: Skin color, texture, turgor normal. No rashes or lesions or marked improvement in swelling and redness.  Disposition: 01-Home or Self Care     Medication List    STOP taking these medications        acetaminophen 500 MG tablet  Commonly known as:  TYLENOL     doxycycline 100 MG capsule  Commonly known as:  VIBRAMYCIN      TAKE these medications        HYDROcodone-acetaminophen 5-325 MG per tablet  Commonly known as:  NORCO  Take 1 tablet by mouth every 6 (six) hours as needed for moderate pain.     ibuprofen 600 MG tablet  Commonly known as:  ADVIL,MOTRIN  Take 1 tablet (600 mg total) by mouth every 6 (six) hours as needed.     multivitamin with minerals Tabs tablet  Take 1 tablet by mouth daily.     vancomycin 1,000 mg in sodium chloride 0.9 % 250 mL  Inject 1,000 mg into the vein every 8 (eight) hours.           Follow-up Information    Follow up with [facility]              In 1 week.   Contact information:   [address] [place] [phone]      Signed: Peggye Form 01/11/2015, 7:45 AM

## 2015-01-14 DIAGNOSIS — X58XXXD Exposure to other specified factors, subsequent encounter: Secondary | ICD-10-CM | POA: Diagnosis not present

## 2015-01-14 DIAGNOSIS — L97512 Non-pressure chronic ulcer of other part of right foot with fat layer exposed: Secondary | ICD-10-CM | POA: Diagnosis not present

## 2015-01-14 DIAGNOSIS — S9781XD Crushing injury of right foot, subsequent encounter: Secondary | ICD-10-CM | POA: Diagnosis not present

## 2015-01-14 DIAGNOSIS — M869 Osteomyelitis, unspecified: Secondary | ICD-10-CM | POA: Diagnosis not present

## 2015-01-16 ENCOUNTER — Encounter (HOSPITAL_COMMUNITY): Payer: Self-pay | Admitting: *Deleted

## 2015-01-16 ENCOUNTER — Other Ambulatory Visit: Payer: Self-pay | Admitting: Plastic Surgery

## 2015-01-16 DIAGNOSIS — L97912 Non-pressure chronic ulcer of unspecified part of right lower leg with fat layer exposed: Secondary | ICD-10-CM

## 2015-01-16 DIAGNOSIS — L97512 Non-pressure chronic ulcer of other part of right foot with fat layer exposed: Secondary | ICD-10-CM

## 2015-01-16 MED ORDER — CEFAZOLIN SODIUM-DEXTROSE 2-3 GM-% IV SOLR
2.0000 g | INTRAVENOUS | Status: AC
  Administered 2015-01-17: 2 g via INTRAVENOUS
  Filled 2015-01-16: qty 50

## 2015-01-16 NOTE — Progress Notes (Signed)
Pt denies cardiac history, chest pain or sob. 

## 2015-01-17 ENCOUNTER — Encounter (HOSPITAL_COMMUNITY): Payer: Self-pay | Admitting: Plastic Surgery

## 2015-01-17 ENCOUNTER — Ambulatory Visit (HOSPITAL_COMMUNITY): Payer: Medicaid Other | Admitting: Anesthesiology

## 2015-01-17 ENCOUNTER — Other Ambulatory Visit: Payer: Self-pay | Admitting: *Deleted

## 2015-01-17 ENCOUNTER — Telehealth: Payer: Self-pay | Admitting: Internal Medicine

## 2015-01-17 ENCOUNTER — Other Ambulatory Visit: Payer: Medicaid Other

## 2015-01-17 ENCOUNTER — Encounter (HOSPITAL_COMMUNITY)
Admission: RE | Disposition: A | Discharge status: Home / Self Care | Payer: Self-pay | Source: Ambulatory Visit | Attending: Plastic Surgery

## 2015-01-17 ENCOUNTER — Ambulatory Visit (HOSPITAL_COMMUNITY)
Admission: RE | Admit: 2015-01-17 | Discharge: 2015-01-17 | Disposition: A | Discharge status: Home / Self Care | Payer: Medicaid Other | Source: Ambulatory Visit | Attending: Plastic Surgery | Admitting: Plastic Surgery

## 2015-01-17 DIAGNOSIS — L97512 Non-pressure chronic ulcer of other part of right foot with fat layer exposed: Secondary | ICD-10-CM

## 2015-01-17 DIAGNOSIS — F17210 Nicotine dependence, cigarettes, uncomplicated: Secondary | ICD-10-CM | POA: Diagnosis not present

## 2015-01-17 DIAGNOSIS — S9781XA Crushing injury of right foot, initial encounter: Secondary | ICD-10-CM | POA: Diagnosis not present

## 2015-01-17 DIAGNOSIS — S91301A Unspecified open wound, right foot, initial encounter: Secondary | ICD-10-CM | POA: Insufficient documentation

## 2015-01-17 DIAGNOSIS — A4902 Methicillin resistant Staphylococcus aureus infection, unspecified site: Secondary | ICD-10-CM

## 2015-01-17 DIAGNOSIS — S81001A Unspecified open wound, right knee, initial encounter: Secondary | ICD-10-CM | POA: Diagnosis present

## 2015-01-17 DIAGNOSIS — F329 Major depressive disorder, single episode, unspecified: Secondary | ICD-10-CM | POA: Insufficient documentation

## 2015-01-17 DIAGNOSIS — L97912 Non-pressure chronic ulcer of unspecified part of right lower leg with fat layer exposed: Secondary | ICD-10-CM

## 2015-01-17 DIAGNOSIS — E875 Hyperkalemia: Secondary | ICD-10-CM

## 2015-01-17 HISTORY — PX: APPLICATION OF A-CELL OF EXTREMITY: SHX6303

## 2015-01-17 HISTORY — PX: I&D EXTREMITY: SHX5045

## 2015-01-17 LAB — BASIC METABOLIC PANEL
Anion gap: 7 (ref 5–15)
BUN: 12 mg/dL (ref 6–20)
CHLORIDE: 101 mmol/L (ref 101–111)
CO2: 28 mmol/L (ref 22–32)
Calcium: 9.2 mg/dL (ref 8.9–10.3)
Creatinine, Ser: 0.86 mg/dL (ref 0.61–1.24)
GFR calc Af Amer: >60 mL/min (ref >60)
GFR calc non Af Amer: >60 mL/min (ref >60)
GLUCOSE: 84 mg/dL (ref 65–99)
POTASSIUM: 4.4 mmol/L (ref 3.5–5.1)
Sodium: 136 mmol/L (ref 135–145)

## 2015-01-17 SURGERY — IRRIGATION AND DEBRIDEMENT EXTREMITY
Anesthesia: General | Site: Foot | Laterality: Right

## 2015-01-17 MED ORDER — ONDANSETRON HCL 4 MG/2ML IJ SOLN
INTRAMUSCULAR | Status: DC | PRN
  Administered 2015-01-17: 4 mg via INTRAVENOUS

## 2015-01-17 MED ORDER — 0.9 % SODIUM CHLORIDE (POUR BTL) OPTIME
TOPICAL | Status: DC | PRN
  Administered 2015-01-17: 1000 mL

## 2015-01-17 MED ORDER — GLYCOPYRROLATE 0.2 MG/ML IJ SOLN
INTRAMUSCULAR | Status: DC | PRN
  Administered 2015-01-17: 0.2 mg via INTRAVENOUS

## 2015-01-17 MED ORDER — FENTANYL CITRATE (PF) 100 MCG/2ML IJ SOLN
INTRAMUSCULAR | Status: DC | PRN
  Administered 2015-01-17 (×2): 50 ug via INTRAVENOUS

## 2015-01-17 MED ORDER — HYDROMORPHONE HCL 1 MG/ML IJ SOLN
0.2500 mg | INTRAMUSCULAR | Status: DC | PRN
  Administered 2015-01-17 (×2): 0.5 mg via INTRAVENOUS

## 2015-01-17 MED ORDER — MIDAZOLAM HCL 2 MG/2ML IJ SOLN
INTRAMUSCULAR | Status: AC
  Filled 2015-01-17: qty 2

## 2015-01-17 MED ORDER — PROPOFOL 10 MG/ML IV BOLUS
INTRAVENOUS | Status: AC
  Filled 2015-01-17: qty 20

## 2015-01-17 MED ORDER — FENTANYL CITRATE (PF) 250 MCG/5ML IJ SOLN
INTRAMUSCULAR | Status: AC
  Filled 2015-01-17: qty 5

## 2015-01-17 MED ORDER — MIDAZOLAM HCL 5 MG/5ML IJ SOLN
INTRAMUSCULAR | Status: DC | PRN
  Administered 2015-01-17: 2 mg via INTRAVENOUS

## 2015-01-17 MED ORDER — LACTATED RINGERS IV SOLN
INTRAVENOUS | Status: DC
  Administered 2015-01-17: 12:00:00 via INTRAVENOUS

## 2015-01-17 MED ORDER — PROPOFOL 10 MG/ML IV BOLUS
INTRAVENOUS | Status: DC | PRN
  Administered 2015-01-17: 150 mg via INTRAVENOUS

## 2015-01-17 MED ORDER — ONDANSETRON HCL 4 MG/2ML IJ SOLN
INTRAMUSCULAR | Status: AC
  Filled 2015-01-17: qty 2

## 2015-01-17 MED ORDER — LIDOCAINE HCL (CARDIAC) 20 MG/ML IV SOLN
INTRAVENOUS | Status: AC
  Filled 2015-01-17: qty 5

## 2015-01-17 MED ORDER — HYDROMORPHONE HCL 1 MG/ML IJ SOLN
INTRAMUSCULAR | Status: AC
  Administered 2015-01-17: 0.5 mg via INTRAVENOUS
  Filled 2015-01-17: qty 1

## 2015-01-17 MED ORDER — SODIUM CHLORIDE 0.9 % IR SOLN
Status: DC | PRN
  Administered 2015-01-17: 500 mL

## 2015-01-17 MED ORDER — PHENYLEPHRINE HCL 10 MG/ML IJ SOLN
INTRAMUSCULAR | Status: DC | PRN
  Administered 2015-01-17: 40 ug via INTRAVENOUS
  Administered 2015-01-17 (×3): 80 ug via INTRAVENOUS
  Administered 2015-01-17: 120 ug via INTRAVENOUS

## 2015-01-17 MED ORDER — LIDOCAINE HCL (CARDIAC) 20 MG/ML IV SOLN
INTRAVENOUS | Status: DC | PRN
  Administered 2015-01-17: 100 mg via INTRAVENOUS

## 2015-01-17 MED ORDER — PHENYLEPHRINE HCL 10 MG/ML IJ SOLN
10.0000 mg | INTRAVENOUS | Status: DC | PRN
  Administered 2015-01-17: 30 ug/min via INTRAVENOUS

## 2015-01-17 SURGICAL SUPPLY — 50 items
APPLICATOR COTTON TIP 6IN STRL (MISCELLANEOUS) ×2 IMPLANT
BANDAGE ELASTIC 4 VELCRO ST LF (GAUZE/BANDAGES/DRESSINGS) ×2 IMPLANT
BANDAGE ELASTIC 6 VELCRO ST LF (GAUZE/BANDAGES/DRESSINGS) ×2 IMPLANT
BLADE SURG ROTATE 9660 (MISCELLANEOUS) IMPLANT
BNDG GAUZE ELAST 4 BULKY (GAUZE/BANDAGES/DRESSINGS) ×2 IMPLANT
CANISTER SUCTION 2500CC (MISCELLANEOUS) ×3 IMPLANT
CHLORAPREP W/TINT 26ML (MISCELLANEOUS) IMPLANT
COVER SURGICAL LIGHT HANDLE (MISCELLANEOUS) ×3 IMPLANT
DRAPE EXTREMITY T 121X128X90 (DRAPE) IMPLANT
DRAPE ORTHO SPLIT 77X108 STRL (DRAPES)
DRAPE SURG ORHT 6 SPLT 77X108 (DRAPES) IMPLANT
DRSG ADAPTIC 3X8 NADH LF (GAUZE/BANDAGES/DRESSINGS) ×4 IMPLANT
DRSG PAD ABDOMINAL 8X10 ST (GAUZE/BANDAGES/DRESSINGS) ×4 IMPLANT
DRSG VAC ATS LRG SENSATRAC (GAUZE/BANDAGES/DRESSINGS) IMPLANT
DRSG VAC ATS MED SENSATRAC (GAUZE/BANDAGES/DRESSINGS) IMPLANT
DRSG VAC ATS SM SENSATRAC (GAUZE/BANDAGES/DRESSINGS) ×2 IMPLANT
ELECT CAUTERY BLADE 6.4 (BLADE) ×3 IMPLANT
ELECT REM PT RETURN 9FT ADLT (ELECTROSURGICAL) ×3
ELECTRODE REM PT RTRN 9FT ADLT (ELECTROSURGICAL) ×1 IMPLANT
GAUZE SPONGE 4X4 12PLY STRL (GAUZE/BANDAGES/DRESSINGS) ×2 IMPLANT
GLOVE BIO SURGEON STRL SZ 6.5 (GLOVE) ×5 IMPLANT
GLOVE BIO SURGEONS STRL SZ 6.5 (GLOVE) ×3
GLOVE BIOGEL PI IND STRL 8.5 (GLOVE) IMPLANT
GLOVE BIOGEL PI INDICATOR 8.5 (GLOVE) ×2
GOWN STRL REUS W/ TWL LRG LVL3 (GOWN DISPOSABLE) ×3 IMPLANT
GOWN STRL REUS W/TWL LRG LVL3 (GOWN DISPOSABLE) ×9
HANDPIECE INTERPULSE COAX TIP (DISPOSABLE)
KIT BASIN OR (CUSTOM PROCEDURE TRAY) ×3 IMPLANT
KIT ROOM TURNOVER OR (KITS) ×3 IMPLANT
MATRIX SURGICAL PSM 7X10CM (Tissue) ×2 IMPLANT
MICROMATRIX 500MG (Tissue) ×3 IMPLANT
NS IRRIG 1000ML POUR BTL (IV SOLUTION) ×3 IMPLANT
PACK GENERAL/GYN (CUSTOM PROCEDURE TRAY) ×3 IMPLANT
PACK ORTHO EXTREMITY (CUSTOM PROCEDURE TRAY) ×3 IMPLANT
PAD ABD 8X10 STRL (GAUZE/BANDAGES/DRESSINGS) IMPLANT
PAD ARMBOARD 7.5X6 YLW CONV (MISCELLANEOUS) ×6 IMPLANT
PAD NEG PRESSURE SENSATRAC (MISCELLANEOUS) IMPLANT
SET HNDPC FAN SPRY TIP SCT (DISPOSABLE) IMPLANT
SOLUTION PARTIC MCRMTRX 500MG (Tissue) IMPLANT
STOCKINETTE IMPERVIOUS 9X36 MD (GAUZE/BANDAGES/DRESSINGS) IMPLANT
STOCKINETTE IMPERVIOUS LG (DRAPES) IMPLANT
SUT VIC AB 5-0 PS2 18 (SUTURE) ×6 IMPLANT
SYR BULB IRRIGATION 50ML (SYRINGE) ×2 IMPLANT
TOWEL OR 17X24 6PK STRL BLUE (TOWEL DISPOSABLE) ×3 IMPLANT
TOWEL OR 17X26 10 PK STRL BLUE (TOWEL DISPOSABLE) ×3 IMPLANT
TUBE CONNECTING 12'X1/4 (SUCTIONS) ×1
TUBE CONNECTING 12X1/4 (SUCTIONS) ×2 IMPLANT
UNDERPAD 30X30 INCONTINENT (UNDERPADS AND DIAPERS) ×3 IMPLANT
WATER STERILE IRR 1000ML POUR (IV SOLUTION) IMPLANT
YANKAUER SUCT BULB TIP NO VENT (SUCTIONS) ×3 IMPLANT

## 2015-01-17 NOTE — Telephone Encounter (Signed)
Left message on patient's phone.  Patient is currently hospitalized, undergoing an I&D.  RN asked patient to call back and confirm that this message was received. Andree Coss, RN
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
## 2015-01-17 NOTE — Transfer of Care (Signed)
Immediate Anesthesia Transfer of Care Note  Patient: [name] [name]  Procedure(s) Performed: Procedure(s): IRRIGATION AND DEBRIDEMENT OF RIGHT FOOT WOUND  (Right) PLACEMENT  A-CELL  (Right)  Patient Location: PACU  Anesthesia Type:General  Level of Consciousness: awake, alert , oriented and patient cooperative  Airway & Oxygen Therapy: Patient Spontanous Breathing and Patient connected to nasal cannula oxygen  Post-op Assessment: Report given to RN and Post -op Vital signs reviewed and stable  Post vital signs: Reviewed  Last Vitals:  Filed Vitals:   01/17/15 1421  BP: 108/52  Pulse: 89  Temp: 36.7 C  Resp: 20    Complications: No apparent anesthesia complications

## 2015-01-17 NOTE — Brief Op Note (Signed)
01/17/2015  2:04 PM  PATIENT:  Jenkins Rouge  49 y.o. male  PRE-OPERATIVE DIAGNOSIS:  WOUND RIGHT FOOT   POST-OPERATIVE DIAGNOSIS:  WOUND RIGHT FOOT   PROCEDURE:  Procedure(s): IRRIGATION AND DEBRIDEMENT OF RIGHT FOOT WOUND  (Right) PLACEMENT  A-CELL  (Right)  SURGEON:  Surgeon(s) and Role:    * Alena Bills Dillingham, DO - Primary  PHYSICIAN ASSISTANT: Shawn Rayburn, PA  ASSISTANTS: none   ANESTHESIA:   general  EBL:  Total I/O In: 400 [I.V.:400] Out: -   BLOOD ADMINISTERED:none  DRAINS: none   LOCAL MEDICATIONS USED:  NONE  SPECIMEN:  No Specimen  DISPOSITION OF SPECIMEN:  N/A  COUNTS:  YES  TOURNIQUET:  * No tourniquets in log *  DICTATION: .Dragon Dictation  PLAN OF CARE: Discharge to home after PACU  PATIENT DISPOSITION:  PACU - hemodynamically stable.   Delay start of Pharmacological VTE agent (>24hrs) due to surgical blood loss or risk of bleeding: no

## 2015-01-17 NOTE — Interval H&P Note (Signed)
History and Physical Interval Note:  01/17/2015 8:01 AM  [name] [name]  has presented today for surgery, with the diagnosis of WOUND RIGHT FOOT   The various methods of treatment have been discussed with the patient and family. After consideration of risks, benefits and other options for treatment, the patient has consented to  Procedure(s): IRRIGATION AND DEBRIDEMENT OF RIGHT FOOT WOUND  (Right) PLACEMENT  A-CELL  (Right) as a surgical intervention .  The patient's history has been reviewed, patient examined, no change in status, stable for surgery.  I have reviewed the patient's chart and labs.  Questions were answered to the patient's satisfaction.     Peggye Form

## 2015-01-17 NOTE — Anesthesia Postprocedure Evaluation (Signed)
  Anesthesia Post-op Note  Patient: [name] [name]  Procedure(s) Performed: Procedure(s): IRRIGATION AND DEBRIDEMENT OF RIGHT FOOT WOUND  (Right) PLACEMENT  A-CELL  (Right)  Patient Location: PACU  Anesthesia Type:General  Level of Consciousness: awake and alert   Airway and Oxygen Therapy: Patient Spontanous Breathing  Post-op Pain: Controlled  Post-op Assessment: Post-op Vital signs reviewed, Patient's Cardiovascular Status Stable and Respiratory Function Stable  Post-op Vital Signs: Reviewed  Filed Vitals:   01/17/15 1445  BP: 102/86  Pulse: 77  Temp: 36.5 C  Resp: 14    Complications: No apparent anesthesia complications

## 2015-01-17 NOTE — Anesthesia Preprocedure Evaluation (Addendum)
Anesthesia Evaluation  Patient identified by MRN, date of birth, ID band Patient awake    Reviewed: Allergy & Precautions, H&P , NPO status , Patient's Chart, lab work & pertinent test results  Airway Mallampati: II  TM Distance: >3 FB Neck ROM: Full    Dental no notable dental hx. (+) Dental Advisory Given, Poor Dentition   Pulmonary Current Smoker,    Pulmonary exam normal breath sounds clear to auscultation       Cardiovascular negative cardio ROS   Rhythm:Regular Rate:Normal     Neuro/Psych Depression negative neurological ROS     GI/Hepatic negative GI ROS, Neg liver ROS,   Endo/Other  negative endocrine ROS  Renal/GU negative Renal ROS  negative genitourinary   Musculoskeletal   Abdominal   Peds  Hematology negative hematology ROS (+)   Anesthesia Other Findings   Reproductive/Obstetrics negative OB ROS                            Anesthesia Physical Anesthesia Plan  ASA: II  Anesthesia Plan: General   Post-op Pain Management:    Induction: Intravenous  Airway Management Planned: LMA  Additional Equipment:   Intra-op Plan:   Post-operative Plan: Extubation in OR  Informed Consent: I have reviewed the patients History and Physical, chart, labs and discussed the procedure including the risks, benefits and alternatives for the proposed anesthesia with the patient or authorized representative who has indicated his/her understanding and acceptance.   Dental advisory given  Plan Discussed with: CRNA  Anesthesia Plan Comments:         Anesthesia Quick Evaluation

## 2015-01-17 NOTE — Discharge Instructions (Addendum)
VAC to knee with change at the wound care center. [place] gel to the foot daily. Keep the foot elevated.  What to eat:  For your first meals, you should eat lightly; only small meals initially.  If you do not have nausea, you may eat larger meals.  Avoid spicy, greasy and heavy food.    General Anesthesia, Adult, Care After  Refer to this sheet in the next few weeks. These instructions provide you with information on caring for yourself after your procedure. Your health care provider may also give you more specific instructions. Your treatment has been planned according to current medical practices, but problems sometimes occur. Call your health care provider if you have any problems or questions after your procedure.  WHAT TO EXPECT AFTER THE PROCEDURE  After the procedure, it is typical to experience:  Sleepiness.  Nausea and vomiting. HOME CARE INSTRUCTIONS  For the first 24 hours after general anesthesia:  Have a responsible person with you.  Do not drive a car. If you are alone, do not take public transportation.  Do not drink alcohol.  Do not take medicine that has not been prescribed by your health care provider.  Do not sign important papers or make important decisions.  You may resume a normal diet and activities as directed by your health care provider.  Change bandages (dressings) as directed.  If you have questions or problems that seem related to general anesthesia, call the hospital and ask for the anesthetist or anesthesiologist on call. SEEK MEDICAL CARE IF:  You have nausea and vomiting that continue the day after anesthesia.  You develop a rash. SEEK IMMEDIATE MEDICAL CARE IF:  You have difficulty breathing.  You have chest pain.  You have any allergic problems. Document Released: 07/13/2000 Document Revised: 12/07/2012 Document Reviewed: 10/20/2012  [facility] Patient Information 2014 [place], [place].   Sore Throat    A sore throat is a painful, burning, sore, or  scratchy feeling of the throat. There may be pain or tenderness when swallowing or talking. You may have other symptoms with a sore throat. These include coughing, sneezing, fever, or a swollen neck. A sore throat is often the first sign of another sickness. These sicknesses may include a cold, flu, strep throat, or an infection called mono. Most sore throats go away without medical treatment.  HOME CARE  Only take medicine as told by your doctor.  Drink enough fluids to keep your pee (urine) clear or pale yellow.  Rest as needed.  Try using throat sprays, lozenges, or suck on hard candy (if older than 4 years or as told).  Sip warm liquids, such as broth, herbal tea, or warm water with honey. Try sucking on frozen ice pops or drinking cold liquids.  Rinse the mouth (gargle) with salt water. Mix 1 teaspoon salt with 8 ounces of water.  Do not smoke. Avoid being around others when they are smoking.  Put a humidifier in your bedroom at night to moisten the air. You can also turn on a hot shower and sit in the bathroom for 5-10 minutes. Be sure the bathroom door is closed. GET HELP RIGHT AWAY IF:  You have trouble breathing.  You cannot swallow fluids, soft foods, or your spit (saliva).  You have more puffiness (swelling) in the throat.  Your sore throat does not get better in 7 days.  You feel sick to your stomach (nauseous) and throw up (vomit).  You have a fever or lasting symptoms for  more than 2-3 days.  You have a fever and your symptoms suddenly get worse. MAKE SURE YOU:  Understand these instructions.  Will watch your condition.  Will get help right away if you are not doing well or get worse. Document Released: 01/14/2008 Document Revised: 12/30/2011 Document Reviewed: 12/13/2011  [facility] Patient Information 2015 [place], [place]. This information is not intended to replace advice given to you by your health care provider. Make sure you discuss any questions you have with your health  care provider.

## 2015-01-17 NOTE — Telephone Encounter (Signed)
Patient walked into [facility] at 4:15, asking if we had called him earlier.  Patient did not check his voicemail, did not know this RN had asked him to ask for a repeat BMP while he was at day surgery, has not had a redraw since Monday 9/26.  Patient without signs/symptoms of hyperkalemia.  Per Tomasita Morrow, stat BMP ordered and drawn at [facility], with Dr. Ephriam Knuckles pager for critical lab results. Andree Coss

## 2015-01-17 NOTE — Anesthesia Procedure Notes (Signed)
Procedure Name: LMA Insertion Date/Time: 01/17/2015 1:26 PM Performed by: Romie Minus K Pre-anesthesia Checklist: Patient identified, Emergency Drugs available, Suction available, Patient being monitored and Timeout performed Patient Re-evaluated:Patient Re-evaluated prior to inductionOxygen Delivery Method: Circle system utilized Preoxygenation: Pre-oxygenation with 100% oxygen Intubation Type: IV induction Ventilation: Mask ventilation without difficulty LMA: LMA inserted LMA Size: 5.0 Number of attempts: 1 Airway Equipment and Method: Stylet Placement Confirmation: positive ETCO2,  CO2 detector and breath sounds checked- equal and bilateral Tube secured with: Tape Dental Injury: Teeth and Oropharynx as per pre-operative assessment

## 2015-01-17 NOTE — Progress Notes (Addendum)
Several things happened while Mr [name] [name] was in PACU today.  Mr [name] [name] stated that he had a phone call from Dr. Laveda [name] office, stating that his potassium level was 6 and that he had to have it redrawn here at the hospital.  Dr. Marguerita [name] was notified, but patient, when he heard that he would have to wait until the lab returned, declined to wait, stating he needed to get to his birthday party  Mr [name] [name] also had a PICC line, from which the fluids were inadvertently discontinued, but he declined to wait for the IV team nurse to flush the line, stating he would do it at home.  Mr [name] [name] stated his mother was out front waiting, and that she would not come into the hospital to get discharge teaching, so discharge teaching was done with the patient alone.  The patient was wheeled to the discharge area by the nurse tech [name] [name]) but not only was the patients family member not there, but she was over 45 minutes away.  Per the nurse tech, the patient got up out of the wheelchair, and walked away saying he was going to smoke and to "visit his nurses".  Patient would not come back to the discharge area, but walked away.  Dr. Sampson [name] called and made aware of all these events.
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
## 2015-01-17 NOTE — Telephone Encounter (Signed)
His potassium is 6.1 from home health, received today.  He needs to go to his PCP or ED today to have this evaluated and treated.

## 2015-01-18 ENCOUNTER — Encounter (HOSPITAL_COMMUNITY): Payer: Self-pay | Admitting: Plastic Surgery

## 2015-01-18 DIAGNOSIS — M869 Osteomyelitis, unspecified: Secondary | ICD-10-CM | POA: Insufficient documentation

## 2015-01-21 ENCOUNTER — Encounter (HOSPITAL_BASED_OUTPATIENT_CLINIC_OR_DEPARTMENT_OTHER): Payer: Medicaid Other | Attending: Plastic Surgery

## 2015-01-21 DIAGNOSIS — H269 Unspecified cataract: Secondary | ICD-10-CM | POA: Insufficient documentation

## 2015-01-21 DIAGNOSIS — S81001A Unspecified open wound, right knee, initial encounter: Secondary | ICD-10-CM | POA: Insufficient documentation

## 2015-01-21 DIAGNOSIS — Z89421 Acquired absence of other right toe(s): Secondary | ICD-10-CM | POA: Diagnosis not present

## 2015-01-21 DIAGNOSIS — S9781XD Crushing injury of right foot, subsequent encounter: Secondary | ICD-10-CM | POA: Insufficient documentation

## 2015-01-21 DIAGNOSIS — Y92410 Unspecified street and highway as the place of occurrence of the external cause: Secondary | ICD-10-CM | POA: Insufficient documentation

## 2015-01-21 DIAGNOSIS — F17210 Nicotine dependence, cigarettes, uncomplicated: Secondary | ICD-10-CM | POA: Insufficient documentation

## 2015-01-28 DIAGNOSIS — S81001A Unspecified open wound, right knee, initial encounter: Secondary | ICD-10-CM | POA: Diagnosis not present

## 2015-01-28 DIAGNOSIS — S9781XD Crushing injury of right foot, subsequent encounter: Secondary | ICD-10-CM | POA: Diagnosis not present

## 2015-01-28 DIAGNOSIS — F17210 Nicotine dependence, cigarettes, uncomplicated: Secondary | ICD-10-CM | POA: Diagnosis not present

## 2015-01-28 DIAGNOSIS — Z89421 Acquired absence of other right toe(s): Secondary | ICD-10-CM | POA: Diagnosis not present

## 2015-02-04 DIAGNOSIS — Z89421 Acquired absence of other right toe(s): Secondary | ICD-10-CM | POA: Diagnosis not present

## 2015-02-04 DIAGNOSIS — F17210 Nicotine dependence, cigarettes, uncomplicated: Secondary | ICD-10-CM | POA: Diagnosis not present

## 2015-02-04 DIAGNOSIS — S81001A Unspecified open wound, right knee, initial encounter: Secondary | ICD-10-CM | POA: Diagnosis not present

## 2015-02-04 DIAGNOSIS — S9781XD Crushing injury of right foot, subsequent encounter: Secondary | ICD-10-CM | POA: Diagnosis not present

## 2015-02-06 ENCOUNTER — Other Ambulatory Visit: Payer: Self-pay | Admitting: Plastic Surgery

## 2015-02-06 ENCOUNTER — Encounter (HOSPITAL_COMMUNITY): Payer: Self-pay | Admitting: *Deleted

## 2015-02-06 DIAGNOSIS — L97512 Non-pressure chronic ulcer of other part of right foot with fat layer exposed: Secondary | ICD-10-CM

## 2015-02-06 DIAGNOSIS — L97812 Non-pressure chronic ulcer of other part of right lower leg with fat layer exposed: Secondary | ICD-10-CM

## 2015-02-06 NOTE — Progress Notes (Signed)
Called Dr. Dillingham's office for pre-op orders. Left message on nurse line voicemail. 

## 2015-02-06 NOTE — Progress Notes (Signed)
No changes in medical history since he was here on 01/17/15. Denies chest pain or sob.

## 2015-02-07 ENCOUNTER — Encounter (HOSPITAL_COMMUNITY)
Admission: RE | Disposition: A | Discharge status: Home / Self Care | Payer: Self-pay | Source: Ambulatory Visit | Attending: Plastic Surgery

## 2015-02-07 ENCOUNTER — Ambulatory Visit (HOSPITAL_COMMUNITY): Payer: Medicaid Other | Admitting: Anesthesiology

## 2015-02-07 ENCOUNTER — Encounter (HOSPITAL_COMMUNITY): Payer: Self-pay | Admitting: Plastic Surgery

## 2015-02-07 ENCOUNTER — Ambulatory Visit (HOSPITAL_COMMUNITY)
Admission: RE | Admit: 2015-02-07 | Discharge: 2015-02-07 | Disposition: A | Discharge status: Home / Self Care | Payer: Medicaid Other | Source: Ambulatory Visit | Attending: Plastic Surgery | Admitting: Plastic Surgery

## 2015-02-07 DIAGNOSIS — L02611 Cutaneous abscess of right foot: Secondary | ICD-10-CM | POA: Diagnosis not present

## 2015-02-07 DIAGNOSIS — L97512 Non-pressure chronic ulcer of other part of right foot with fat layer exposed: Secondary | ICD-10-CM

## 2015-02-07 DIAGNOSIS — L02415 Cutaneous abscess of right lower limb: Secondary | ICD-10-CM | POA: Insufficient documentation

## 2015-02-07 DIAGNOSIS — L97819 Non-pressure chronic ulcer of other part of right lower leg with unspecified severity: Secondary | ICD-10-CM | POA: Insufficient documentation

## 2015-02-07 DIAGNOSIS — F329 Major depressive disorder, single episode, unspecified: Secondary | ICD-10-CM | POA: Insufficient documentation

## 2015-02-07 DIAGNOSIS — L97812 Non-pressure chronic ulcer of other part of right lower leg with fat layer exposed: Secondary | ICD-10-CM

## 2015-02-07 DIAGNOSIS — F17210 Nicotine dependence, cigarettes, uncomplicated: Secondary | ICD-10-CM | POA: Diagnosis not present

## 2015-02-07 HISTORY — PX: INCISION AND DRAINAGE OF WOUND: SHX1803

## 2015-02-07 LAB — CBC
HCT: 38.9 % — ABNORMAL LOW (ref 39.0–52.0)
HEMOGLOBIN: 13.1 g/dL (ref 13.0–17.0)
MCH: 29.5 pg (ref 26.0–34.0)
MCHC: 33.7 g/dL (ref 30.0–36.0)
MCV: 87.6 fL (ref 78.0–100.0)
Platelets: 241 10*3/uL (ref 150–400)
RBC: 4.44 MIL/uL (ref 4.22–5.81)
RDW: 13.2 % (ref 11.5–15.5)
WBC: 8 10*3/uL (ref 4.0–10.5)

## 2015-02-07 LAB — BASIC METABOLIC PANEL
ANION GAP: 9 (ref 5–15)
BUN: 12 mg/dL (ref 6–20)
CALCIUM: 10 mg/dL (ref 8.9–10.3)
CO2: 23 mmol/L (ref 22–32)
CREATININE: 0.8 mg/dL (ref 0.61–1.24)
Chloride: 106 mmol/L (ref 101–111)
GFR calc Af Amer: >60 mL/min (ref >60)
GLUCOSE: 89 mg/dL (ref 65–99)
Potassium: 4.4 mmol/L (ref 3.5–5.1)
Sodium: 138 mmol/L (ref 135–145)

## 2015-02-07 LAB — SURGICAL PCR SCREEN
MRSA, PCR: NEGATIVE
STAPHYLOCOCCUS AUREUS: NEGATIVE

## 2015-02-07 SURGERY — IRRIGATION AND DEBRIDEMENT WOUND
Anesthesia: General | Site: Knee | Laterality: Right

## 2015-02-07 MED ORDER — MIDAZOLAM HCL 2 MG/2ML IJ SOLN
INTRAMUSCULAR | Status: AC
  Filled 2015-02-07: qty 4

## 2015-02-07 MED ORDER — ONDANSETRON HCL 4 MG/2ML IJ SOLN
INTRAMUSCULAR | Status: DC | PRN
  Administered 2015-02-07: 4 mg via INTRAVENOUS

## 2015-02-07 MED ORDER — EPINEPHRINE HCL 1 MG/ML IJ SOLN
INTRAMUSCULAR | Status: AC
  Filled 2015-02-07: qty 1

## 2015-02-07 MED ORDER — EPHEDRINE SULFATE 50 MG/ML IJ SOLN
INTRAMUSCULAR | Status: DC | PRN
  Administered 2015-02-07 (×2): 5 mg via INTRAVENOUS

## 2015-02-07 MED ORDER — PROPOFOL 10 MG/ML IV BOLUS
INTRAVENOUS | Status: AC
  Filled 2015-02-07: qty 20

## 2015-02-07 MED ORDER — FENTANYL CITRATE (PF) 100 MCG/2ML IJ SOLN
INTRAMUSCULAR | Status: DC | PRN
  Administered 2015-02-07: 25 ug via INTRAVENOUS
  Administered 2015-02-07: 50 ug via INTRAVENOUS
  Administered 2015-02-07: 100 ug via INTRAVENOUS
  Administered 2015-02-07: 25 ug via INTRAVENOUS

## 2015-02-07 MED ORDER — MEPERIDINE HCL 25 MG/ML IJ SOLN: 6.2500 mg | INTRAMUSCULAR | Status: DC | PRN

## 2015-02-07 MED ORDER — EVICEL 5 ML EX KIT
PACK | CUTANEOUS | Status: AC
  Filled 2015-02-07: qty 2

## 2015-02-07 MED ORDER — LIDOCAINE HCL (CARDIAC) 20 MG/ML IV SOLN
INTRAVENOUS | Status: AC
  Filled 2015-02-07: qty 5

## 2015-02-07 MED ORDER — SUCCINYLCHOLINE CHLORIDE 20 MG/ML IJ SOLN
INTRAMUSCULAR | Status: DC | PRN
  Administered 2015-02-07: 160 mg via INTRAVENOUS

## 2015-02-07 MED ORDER — LIDOCAINE HCL (CARDIAC) 20 MG/ML IV SOLN
INTRAVENOUS | Status: DC | PRN
  Administered 2015-02-07: 100 mg via INTRAVENOUS

## 2015-02-07 MED ORDER — SODIUM CHLORIDE 0.9 % IR SOLN
Status: DC | PRN
  Administered 2015-02-07: 500 mL

## 2015-02-07 MED ORDER — BUPIVACAINE HCL (PF) 0.25 % IJ SOLN
INTRAMUSCULAR | Status: AC
  Filled 2015-02-07: qty 30

## 2015-02-07 MED ORDER — PROPOFOL 10 MG/ML IV BOLUS
INTRAVENOUS | Status: DC | PRN
  Administered 2015-02-07: 150 mg via INTRAVENOUS

## 2015-02-07 MED ORDER — CEFAZOLIN SODIUM-DEXTROSE 2-3 GM-% IV SOLR
2.0000 g | INTRAVENOUS | Status: AC
  Administered 2015-02-07: 2 g via INTRAVENOUS
  Filled 2015-02-07: qty 50

## 2015-02-07 MED ORDER — FENTANYL CITRATE (PF) 250 MCG/5ML IJ SOLN
INTRAMUSCULAR | Status: AC
  Filled 2015-02-07: qty 5

## 2015-02-07 MED ORDER — LACTATED RINGERS IV SOLN
INTRAVENOUS | Status: DC
  Administered 2015-02-07 (×2): via INTRAVENOUS

## 2015-02-07 MED ORDER — MUPIROCIN 2 % EX OINT
1.0000 "application " | TOPICAL_OINTMENT | Freq: Once | CUTANEOUS | Status: AC
  Administered 2015-02-07: 1 via TOPICAL
  Filled 2015-02-07: qty 22

## 2015-02-07 MED ORDER — MIDAZOLAM HCL 5 MG/5ML IJ SOLN
INTRAMUSCULAR | Status: DC | PRN
  Administered 2015-02-07: 2 mg via INTRAVENOUS

## 2015-02-07 MED ORDER — LIDOCAINE HCL 4 % MT SOLN
OROMUCOSAL | Status: DC | PRN
  Administered 2015-02-07: 4 mL via TOPICAL

## 2015-02-07 MED ORDER — HYDROMORPHONE HCL 1 MG/ML IJ SOLN: 0.2500 mg | INTRAMUSCULAR | Status: DC | PRN

## 2015-02-07 MED ORDER — ONDANSETRON HCL 4 MG/2ML IJ SOLN
INTRAMUSCULAR | Status: AC
Start: 2015-02-07 — End: 2015-02-07
  Filled 2015-02-07: qty 2

## 2015-02-07 MED ORDER — DOUBLE ANTIBIOTIC 500-10000 UNIT/GM EX OINT
TOPICAL_OINTMENT | CUTANEOUS | Status: AC
  Filled 2015-02-07: qty 1

## 2015-02-07 MED ORDER — MINERAL OIL LIGHT 100 % EX OIL
TOPICAL_OIL | CUTANEOUS | Status: AC
  Filled 2015-02-07: qty 25

## 2015-02-07 MED ORDER — ONDANSETRON HCL 4 MG/2ML IJ SOLN: 4.0000 mg | Freq: Once | INTRAMUSCULAR | Status: DC | PRN

## 2015-02-07 SURGICAL SUPPLY — 70 items
APL SKNCLS STERI-STRIP NONHPOA (GAUZE/BANDAGES/DRESSINGS) ×4
BAG DECANTER FOR FLEXI CONT (MISCELLANEOUS) IMPLANT
BANDAGE ELASTIC 4 VELCRO ST LF (GAUZE/BANDAGES/DRESSINGS) ×3 IMPLANT
BANDAGE ELASTIC 6 VELCRO ST LF (GAUZE/BANDAGES/DRESSINGS) IMPLANT
BENZOIN TINCTURE PRP APPL 2/3 (GAUZE/BANDAGES/DRESSINGS) ×8 IMPLANT
BLADE 10 SAFETY STRL DISP (BLADE) ×4 IMPLANT
BLADE SURG ROTATE 9660 (MISCELLANEOUS) IMPLANT
BNDG GAUZE ELAST 4 BULKY (GAUZE/BANDAGES/DRESSINGS) ×3 IMPLANT
CANISTER SUCTION 2500CC (MISCELLANEOUS) ×4 IMPLANT
CANISTER WOUND CARE 500ML ATS (WOUND CARE) IMPLANT
CONT SPEC STER OR (MISCELLANEOUS) IMPLANT
COVER SURGICAL LIGHT HANDLE (MISCELLANEOUS) ×4 IMPLANT
DERMACARRIERS GRAFT 1 TO 1.5 (DISPOSABLE) ×4
DRAPE IMP U-DRAPE 54X76 (DRAPES) ×4 IMPLANT
DRAPE INCISE IOBAN 66X45 STRL (DRAPES) ×4 IMPLANT
DRAPE LAPAROSCOPIC ABDOMINAL (DRAPES) IMPLANT
DRAPE ORTHO SPLIT 77X108 STRL (DRAPES) ×8
DRAPE PED LAPAROTOMY (DRAPES) ×4 IMPLANT
DRAPE PROXIMA HALF (DRAPES) ×4 IMPLANT
DRAPE STERI IOBAN 125X83 (DRAPES) ×3 IMPLANT
DRAPE SURG ORHT 6 SPLT 77X108 (DRAPES) ×4 IMPLANT
DRESSING TELFA 8X3 (GAUZE/BANDAGES/DRESSINGS) ×8 IMPLANT
DRSG ADAPTIC 3X8 NADH LF (GAUZE/BANDAGES/DRESSINGS) ×4 IMPLANT
DRSG OPSITE 6X11 MED (GAUZE/BANDAGES/DRESSINGS) IMPLANT
DRSG PAD ABDOMINAL 8X10 ST (GAUZE/BANDAGES/DRESSINGS) ×8 IMPLANT
DRSG VAC ATS LRG SENSATRAC (GAUZE/BANDAGES/DRESSINGS) IMPLANT
DRSG VAC ATS MED SENSATRAC (GAUZE/BANDAGES/DRESSINGS) IMPLANT
DRSG VAC ATS SM SENSATRAC (GAUZE/BANDAGES/DRESSINGS) ×3 IMPLANT
ELECT CAUTERY BLADE 6.4 (BLADE) IMPLANT
ELECT REM PT RETURN 9FT ADLT (ELECTROSURGICAL) ×4
ELECTRODE REM PT RTRN 9FT ADLT (ELECTROSURGICAL) ×2 IMPLANT
FILTER STRAW FLUID ASPIR (MISCELLANEOUS) ×4 IMPLANT
GAUZE SPONGE 4X4 12PLY STRL (GAUZE/BANDAGES/DRESSINGS) ×4 IMPLANT
GAUZE XEROFORM 5X9 LF (GAUZE/BANDAGES/DRESSINGS) ×8 IMPLANT
GLOVE BIO SURGEON STRL SZ 6.5 (GLOVE) ×3 IMPLANT
GLOVE BIO SURGEONS STRL SZ 6.5 (GLOVE) ×1
GOWN STRL REUS W/ TWL LRG LVL3 (GOWN DISPOSABLE) ×6 IMPLANT
GOWN STRL REUS W/TWL LRG LVL3 (GOWN DISPOSABLE) ×12
GRAFT DERMACARRIERS 1 TO 1.5 (DISPOSABLE) ×2 IMPLANT
HANDPIECE INTERPULSE COAX TIP (DISPOSABLE)
KIT BASIN OR (CUSTOM PROCEDURE TRAY) ×4 IMPLANT
KIT ROOM TURNOVER OR (KITS) ×4 IMPLANT
MATRIX SURGICAL PSM 5X5CM (Tissue) ×3 IMPLANT
MICROMATRIX 500MG (Tissue) ×4 IMPLANT
NDL SPNL 18GX3.5 QUINCKE PK (NEEDLE) ×1 IMPLANT
NEEDLE SPNL 18GX3.5 QUINCKE PK (NEEDLE) ×4 IMPLANT
NS IRRIG 1000ML POUR BTL (IV SOLUTION) ×4 IMPLANT
PACK GENERAL/GYN (CUSTOM PROCEDURE TRAY) ×4 IMPLANT
PACK UNIVERSAL I (CUSTOM PROCEDURE TRAY) ×4 IMPLANT
PAD ABD 8X10 STRL (GAUZE/BANDAGES/DRESSINGS) ×3 IMPLANT
PAD ARMBOARD 7.5X6 YLW CONV (MISCELLANEOUS) ×8 IMPLANT
PADDING CAST COTTON 6X4 STRL (CAST SUPPLIES) IMPLANT
PATTIES SURGICAL .5 X.5 (GAUZE/BANDAGES/DRESSINGS) ×6 IMPLANT
SET HNDPC FAN SPRY TIP SCT (DISPOSABLE) IMPLANT
SOLUTION PARTIC MCRMTRX 500MG (Tissue) ×1 IMPLANT
STAPLER VISISTAT 35W (STAPLE) ×4 IMPLANT
SURGILUBE 2OZ TUBE FLIPTOP (MISCELLANEOUS) IMPLANT
SUT CHROMIC 4 0 PS 2 18 (SUTURE) ×8 IMPLANT
SUT MNCRL AB 3-0 PS2 18 (SUTURE) ×3 IMPLANT
SUT VIC AB 5-0 P-3 18XBRD (SUTURE) ×4 IMPLANT
SUT VIC AB 5-0 P3 18 (SUTURE) ×8
SUT VIC AB 5-0 PS2 18 (SUTURE) IMPLANT
SWAB COLLECTION DEVICE MRSA (MISCELLANEOUS) IMPLANT
SYR 3ML 25GX5/8 SAFETY (SYRINGE) ×4 IMPLANT
SYR CONTROL 10ML LL (SYRINGE) ×4 IMPLANT
TOWEL OR 17X24 6PK STRL BLUE (TOWEL DISPOSABLE) ×4 IMPLANT
TOWEL OR 17X26 10 PK STRL BLUE (TOWEL DISPOSABLE) ×4 IMPLANT
TUBE ANAEROBIC SPECIMEN COL (MISCELLANEOUS) IMPLANT
UNDERPAD 30X30 INCONTINENT (UNDERPADS AND DIAPERS) ×4 IMPLANT
WATER STERILE IRR 1000ML POUR (IV SOLUTION) IMPLANT

## 2015-02-07 NOTE — Progress Notes (Signed)
Patient called and stated he forgot and had breakfast at 730 am.  He drank orange juice, 4 cups coffee, chicken biscuit w/ mayo.  Drank second glass orange juice at 840 am.  Spoke with Dr. Noreene LarssonJoslin who stated patient should come on in, but case may be delayed or possibly cancelled.(patient made aware).

## 2015-02-07 NOTE — Anesthesia Preprocedure Evaluation (Addendum)
Anesthesia Evaluation  Patient identified by MRN, date of birth, ID band Patient awake    Reviewed: Allergy & Precautions, NPO status , Patient's Chart, lab work & pertinent test results  Airway Mallampati: I  TM Distance: >3 FB Neck ROM: Full    Dental   Pulmonary Current Smoker,    Pulmonary exam normal        Cardiovascular Normal cardiovascular exam     Neuro/Psych Depression    GI/Hepatic   Endo/Other    Renal/GU      Musculoskeletal   Abdominal   Peds  Hematology   Anesthesia Other Findings   Reproductive/Obstetrics                             Anesthesia Physical Anesthesia Plan  ASA: II  Anesthesia Plan: General   Post-op Pain Management:    Induction: Intravenous  Airway Management Planned: Oral ETT  Additional Equipment:   Intra-op Plan:   Post-operative Plan: Extubation in OR  Informed Consent: I have reviewed the patients History and Physical, chart, labs and discussed the procedure including the risks, benefits and alternatives for the proposed anesthesia with the patient or authorized representative who has indicated his/her understanding and acceptance.     Plan Discussed with: CRNA and Surgeon  Anesthesia Plan Comments:         Anesthesia Quick Evaluation  

## 2015-02-07 NOTE — Discharge Instructions (Signed)
VAC change once a week KY gel to the foot daily starting on Saturday

## 2015-02-07 NOTE — Anesthesia Postprocedure Evaluation (Signed)
Anesthesia Post Note  Patient: [name] [name]  Procedure(s) Performed: Procedure(s) (LRB): IRRIGATION AND DEBRIDEMENT OF RIGHT KNEE ULCER WITH SURGICAL PREP AND A  CELL PLACEMENT VAC PLACEMENT (Right)  Anesthesia type: general  Patient location: PACU  Post pain: Pain level controlled  Post assessment: Patient's Cardiovascular Status Stable  Last Vitals:  Filed Vitals:   02/07/15 1620  BP: 135/75  Pulse: 87  Temp: 36.1 C  Resp: 16    Post vital signs: Reviewed and stable  Level of consciousness: sedated  Complications: No apparent anesthesia complications

## 2015-02-07 NOTE — Anesthesia Procedure Notes (Signed)
Procedure Name: Intubation Date/Time: 02/07/2015 3:12 PM Performed by: Coralee RudFLORES, Curry Dulski Pre-anesthesia Checklist: Patient identified, Emergency Drugs available, Suction available, Patient being monitored and Timeout performed Patient Re-evaluated:Patient Re-evaluated prior to inductionOxygen Delivery Method: Circle system utilized Preoxygenation: Pre-oxygenation with 100% oxygen Intubation Type: IV induction and Rapid sequence Laryngoscope Size: Mac and 4 Grade View: Grade I Tube size: 7.5 mm Number of attempts: 1 Airway Equipment and Method: Stylet Placement Confirmation: ETT inserted through vocal cords under direct vision,  positive ETCO2 and breath sounds checked- equal and bilateral Secured at: 23 cm Tube secured with: Tape Dental Injury: Teeth and Oropharynx as per pre-operative assessment

## 2015-02-07 NOTE — H&P (View-Only) (Signed)
2 Days Post-Op  Subjective: Overall doing well,  Swelling and redness has improved.  Home VAC connected.    Objective: Vital signs in last 24 hours: Temp:  [98.1 F (36.7 C)-99 F (37.2 C)] 98.5 F (36.9 C) (09/22 0455) Pulse Rate:  [84-95] 84 (09/22 0455) Resp:  [18-20] 18 (09/22 0455) BP: (119-132)/(58-85) 119/58 mmHg (09/22 0455) SpO2:  [95 %-98 %] 95 % (09/22 0455) Last BM Date: 01/09/15  Intake/Output from previous day: 09/21 0701 - 09/22 0700 In: 1120 [P.O.:720; IV Piggyback:400] Out: 75 [Drains:75] Intake/Output this shift: Total I/O In: 240 [P.O.:240] Out: 150 [Drains:150]  General appearance: alert, cooperative and no distress Incision/Wound:  Lab Results:  No results for input(s): WBC, HGB, HCT, PLT in the last 72 hours. BMET  Recent Labs  01/08/15 0546 01/09/15 0535  NA 135 134*  K 3.9 5.0  CL 101 97*  CO2 27 29  GLUCOSE 112* 94  BUN 8 7  CREATININE 0.78 0.83  CALCIUM 8.8* 8.9   PT/INR No results for input(s): LABPROT, INR in the last 72 hours. ABG No results for input(s): PHART, HCO3 in the last 72 hours.  Invalid input(s): PCO2, PO2  Studies/Results: No results found.  Anti-infectives: Anti-infectives    Start     Dose/Rate Route Frequency Ordered Stop   01/09/15 2200  vancomycin (VANCOCIN) IVPB 1000 mg/200 mL premix     1,000 mg 200 mL/hr over 60 Minutes Intravenous Every 8 hours 01/09/15 1652     01/08/15 1420  polymyxin B 500,000 Units, bacitracin 50,000 Units in sodium chloride irrigation 0.9 % 500 mL irrigation  Status:  Discontinued       As needed 01/08/15 1420 01/08/15 1451   01/08/15 1419  polymyxin B 500,000 Units, bacitracin 50,000 Units in sodium chloride irrigation 0.9 % 500 mL irrigation  Status:  Discontinued       As needed 01/08/15 1420 01/08/15 1451   01/08/15 0600  ceFAZolin (ANCEF) IVPB 2 g/50 mL premix     2 g 100 mL/hr over 30 Minutes Intravenous To [facility] 01/07/15 1747 01/08/15 1400   01/07/15 2200   cefTAZidime (FORTAZ) injection 2 g  Status:  Discontinued     2 g Intramuscular 3 times per day 01/07/15 1753 01/07/15 1759   01/07/15 2200  cefTAZidime (FORTAZ) 2 g in dextrose 5 % 50 mL IVPB  Status:  Discontinued     2 g 100 mL/hr over 30 Minutes Intravenous 3 times per day 01/07/15 1759 01/08/15 1142   01/07/15 1400  vancomycin (VANCOCIN) IVPB 1000 mg/200 mL premix  Status:  Discontinued     1,000 mg 200 mL/hr over 60 Minutes Intravenous Every 12 hours 01/07/15 1337 01/09/15 1652   01/06/15 0600  vancomycin (VANCOCIN) IVPB 1000 mg/200 mL premix  Status:  Discontinued     1,000 mg 200 mL/hr over 60 Minutes Intravenous Every 12 hours 01/05/15 1955 01/06/15 2238   01/06/15 0500  vancomycin (VANCOCIN) IVPB 1000 mg/200 mL premix  Status:  Discontinued     1,000 mg 200 mL/hr over 60 Minutes Intravenous Every 12 hours 01/05/15 1625 01/05/15 1707   01/06/15 0100  piperacillin-tazobactam (ZOSYN) IVPB 3.375 g  Status:  Discontinued     3.375 g 12.5 mL/hr over 240 Minutes Intravenous 3 times per day 01/05/15 1625 01/05/15 1707   01/05/15 2000  piperacillin-tazobactam (ZOSYN) IVPB 3.375 g  Status:  Discontinued     3.375 g 12.5 mL/hr over 240 Minutes Intravenous 3 times per day 01/05/15 1954  01/07/15 1753   01/05/15 1700  vancomycin (VANCOCIN) 1,750 mg in sodium chloride 0.9 % 500 mL IVPB  Status:  Discontinued     1,750 mg 250 mL/hr over 120 Minutes Intravenous  Once 01/05/15 1619 01/05/15 1707   01/05/15 1615  piperacillin-tazobactam (ZOSYN) IVPB 3.375 g  Status:  Discontinued     3.375 g 100 mL/hr over 30 Minutes Intravenous  Once 01/05/15 1605 01/05/15 1707   01/05/15 1615  vancomycin (VANCOCIN) IVPB 1000 mg/200 mL premix  Status:  Discontinued     1,000 mg 200 mL/hr over 60 Minutes Intravenous  Once 01/05/15 1605 01/05/15 1619      Assessment/Plan: s/p Procedure(s): IRRIGATION AND DEBRIDEMENT OF RIGHT FOOT AND RIGHT KNEE WOUND AND APPICATION OF WOUND VAC FOR RIGHT KNEE  (Right) APPLICATION OF A-CELL in Knee and Foot  (Right) Discharge today if antibiotics arranged.  Home VAC will be changed at the wound care center Monday.  LOS: 5 days    Peggye FormCLAIRE S [name] 01/10/2015

## 2015-02-07 NOTE — Transfer of Care (Signed)
Immediate Anesthesia Transfer of Care Note  Patient: [name] [name]  Procedure(s) Performed: Procedure(s): IRRIGATION AND DEBRIDEMENT OF RIGHT KNEE ULCER WITH SURGICAL PREP AND A  CELL PLACEMENT VAC PLACEMENT (Right)  Patient Location: PACU  Anesthesia Type:General  Level of Consciousness: awake, alert  and oriented  Airway & Oxygen Therapy: Patient Spontanous Breathing  Post-op Assessment: Report given to RN  Post vital signs: stable  Last Vitals:  Filed Vitals:   02/07/15 1302  BP: 143/67  Pulse: 92  Temp: 37.5 C  Resp: 15    Complications: No apparent anesthesia complications

## 2015-02-07 NOTE — Brief Op Note (Signed)
02/07/2015  4:07 PM  PATIENT:  [name] [name]  49 y.o. male  PRE-OPERATIVE DIAGNOSIS:  right foot wound and right knee ulcer  POST-OPERATIVE DIAGNOSIS:  right foot wound and right knee ulcer  PROCEDURE:  Procedure(s): IRRIGATION AND DEBRIDEMENT OF RIGHT KNEE ULCER WITH SURGICAL PREP AND A  CELL PLACEMENT VAC PLACEMENT (Right)  SURGEON:  Surgeon(s) and Role:    * Loron Weimer S Ahniyah Giancola, DO - Primary  PHYSICIAN ASSISTANT: Shawn Rayburn, PA  ASSISTANTS: none   ANESTHESIA:   general  EBL:  Total I/O In: 800 [I.V.:800] Out: -   BLOOD ADMINISTERED:none  DRAINS: none   LOCAL MEDICATIONS USED:  NONE  SPECIMEN:  No Specimen  DISPOSITION OF SPECIMEN:  N/A  COUNTS:  YES  TOURNIQUET:  * No tourniquets in log *  DICTATION: .Dragon Dictation  PLAN OF CARE: Discharge to home after PACU  PATIENT DISPOSITION:  PACU - hemodynamically stable.   Delay start of Pharmacological VTE agent (>24hrs) due to surgical blood loss or risk of bleeding: no

## 2015-02-07 NOTE — Interval H&P Note (Signed)
History and Physical Interval Note:  02/07/2015 7:28 AM  [name]  has presented today for surgery, with the diagnosis of right foot wound and right knee ulcer  The various methods of treatment have been discussed with the patient and family. After consideration of risks, benefits and other options for treatment, the patient has consented to  Procedure(s): SKIN GRAFT SPLIT THICKNESS TO RIGHT FOOT (Right) IRRIGATION AND DEBRIDEMENT OF RIGHT KNEE ULCER WITH SURGICAL PREP AND POSSIBLE A CELL PLACEMENT VAC PLACEMENT (Right) as a surgical intervention .  The patient's history has been reviewed, patient examined, no change in status, stable for surgery.  I have reviewed the patient's chart and labs.  Questions were answered to the patient's satisfaction.     Peggye FormLAIRE S Miguelina Fore
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
## 2015-02-08 ENCOUNTER — Encounter (HOSPITAL_COMMUNITY): Payer: Self-pay | Admitting: Plastic Surgery

## 2015-02-11 DIAGNOSIS — S9781XD Crushing injury of right foot, subsequent encounter: Secondary | ICD-10-CM | POA: Diagnosis not present

## 2015-02-14 ENCOUNTER — Encounter: Payer: Self-pay | Admitting: Internal Medicine

## 2015-02-14 ENCOUNTER — Ambulatory Visit (INDEPENDENT_AMBULATORY_CARE_PROVIDER_SITE_OTHER): Payer: Medicaid Other | Admitting: Internal Medicine

## 2015-02-14 VITALS — BP 128/72 | HR 86 | Temp 97.2°F | Wt 215.0 lb

## 2015-02-14 DIAGNOSIS — A4902 Methicillin resistant Staphylococcus aureus infection, unspecified site: Secondary | ICD-10-CM

## 2015-02-14 DIAGNOSIS — L97512 Non-pressure chronic ulcer of other part of right foot with fat layer exposed: Secondary | ICD-10-CM | POA: Diagnosis not present

## 2015-02-14 DIAGNOSIS — Z23 Encounter for immunization: Secondary | ICD-10-CM | POA: Diagnosis not present

## 2015-02-14 NOTE — Progress Notes (Signed)
Per Dr Snider 41 cm  Single lumen Peripherally Inserted Central Catheter  removed from right basilic . No sutures present. Dressing was clean and dry . Area cleansed with chlorhexidine and petroleum dressing applied. Pt advised no heavy lifting with this arm, leave dressing for 24 hours and call the office if dressing becomes soaked with blood or sharp pain presents.  Pt tolerated procedure well.    Shanekqua Schaper K Nyasiah Moffet, RN  
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
## 2015-02-15 LAB — SEDIMENTATION RATE: SED RATE: 13 mm/h (ref 0–15)

## 2015-02-25 ENCOUNTER — Encounter (HOSPITAL_BASED_OUTPATIENT_CLINIC_OR_DEPARTMENT_OTHER): Payer: No Typology Code available for payment source | Attending: Plastic Surgery

## 2015-02-25 DIAGNOSIS — H269 Unspecified cataract: Secondary | ICD-10-CM | POA: Insufficient documentation

## 2015-02-25 DIAGNOSIS — S9781XA Crushing injury of right foot, initial encounter: Secondary | ICD-10-CM | POA: Insufficient documentation

## 2015-02-25 DIAGNOSIS — Y92410 Unspecified street and highway as the place of occurrence of the external cause: Secondary | ICD-10-CM | POA: Diagnosis not present

## 2015-03-04 DIAGNOSIS — S9781XA Crushing injury of right foot, initial encounter: Secondary | ICD-10-CM | POA: Diagnosis not present

## 2015-03-19 ENCOUNTER — Encounter: Payer: Self-pay | Admitting: Internal Medicine

## 2015-03-25 ENCOUNTER — Encounter (HOSPITAL_BASED_OUTPATIENT_CLINIC_OR_DEPARTMENT_OTHER): Payer: Medicaid Other | Attending: Plastic Surgery

## 2015-03-25 DIAGNOSIS — Y92410 Unspecified street and highway as the place of occurrence of the external cause: Secondary | ICD-10-CM | POA: Diagnosis not present

## 2015-03-25 DIAGNOSIS — S9781XA Crushing injury of right foot, initial encounter: Secondary | ICD-10-CM | POA: Insufficient documentation

## 2015-03-26 ENCOUNTER — Ambulatory Visit: Payer: Self-pay | Admitting: Internal Medicine

## 2015-04-24 ENCOUNTER — Encounter: Payer: Self-pay | Admitting: Internal Medicine

## 2015-05-17 ENCOUNTER — Emergency Department (HOSPITAL_COMMUNITY)
Admission: EM | Admit: 2015-05-17 | Discharge: 2015-05-17 | Disposition: A | Discharge status: Home / Self Care | Payer: Medicaid Other | Attending: Emergency Medicine | Admitting: Emergency Medicine

## 2015-05-17 ENCOUNTER — Encounter (HOSPITAL_COMMUNITY): Payer: Self-pay | Admitting: *Deleted

## 2015-05-17 DIAGNOSIS — Z8781 Personal history of (healed) traumatic fracture: Secondary | ICD-10-CM | POA: Diagnosis not present

## 2015-05-17 DIAGNOSIS — R45851 Suicidal ideations: Secondary | ICD-10-CM | POA: Diagnosis present

## 2015-05-17 DIAGNOSIS — F17210 Nicotine dependence, cigarettes, uncomplicated: Secondary | ICD-10-CM | POA: Insufficient documentation

## 2015-05-17 DIAGNOSIS — F329 Major depressive disorder, single episode, unspecified: Secondary | ICD-10-CM | POA: Diagnosis not present

## 2015-05-17 DIAGNOSIS — Z872 Personal history of diseases of the skin and subcutaneous tissue: Secondary | ICD-10-CM | POA: Diagnosis not present

## 2015-05-17 DIAGNOSIS — F32A Depression, unspecified: Secondary | ICD-10-CM

## 2015-05-17 NOTE — Discharge Instructions (Signed)
Major Depressive Disorder °Major depressive disorder is a mental illness. It also may be called clinical depression or unipolar depression. Major depressive disorder usually causes feelings of sadness, hopelessness, or helplessness. Some people with this disorder do not feel particularly sad but lose interest in doing things they used to enjoy (anhedonia). Major depressive disorder also can cause physical symptoms. It can interfere with work, school, relationships, and other normal everyday activities. The disorder varies in severity but is longer lasting and more serious than the sadness we all feel from time to time in our lives. °Major depressive disorder often is triggered by stressful life events or major life changes. Examples of these triggers include divorce, loss of your job or home, a move, and the death of a family member or close friend. Sometimes this disorder occurs for no obvious reason at all. People who have family members with major depressive disorder or bipolar disorder are at higher risk for developing this disorder, with or without life stressors. Major depressive disorder can occur at any age. It may occur just once in your life (single episode major depressive disorder). It may occur multiple times (recurrent major depressive disorder). °SYMPTOMS °People with major depressive disorder have either anhedonia or depressed mood on nearly a daily basis for at least 2 weeks or longer. Symptoms of depressed mood include: °· Feelings of sadness (blue or down in the dumps) or emptiness. °· Feelings of hopelessness or helplessness. °· Tearfulness or episodes of crying (may be observed by others). °· Irritability (children and adolescents). °In addition to depressed mood or anhedonia or both, people with this disorder have at least four of the following symptoms: °· Difficulty sleeping or sleeping too much.   °· Significant change (increase or decrease) in appetite or weight.   °· Lack of energy or  motivation. °· Feelings of guilt and worthlessness.   °· Difficulty concentrating, remembering, or making decisions. °· Unusually slow movement (psychomotor retardation) or restlessness (as observed by others).   °· Recurrent wishes for death, recurrent thoughts of self-harm (suicide), or a suicide attempt. °People with major depressive disorder commonly have persistent negative thoughts about themselves, other people, and the world. People with severe major depressive disorder may experience distorted beliefs or perceptions about the world (psychotic delusions). They also may see or hear things that are not real (psychotic hallucinations). °DIAGNOSIS °Major depressive disorder is diagnosed through an assessment by your health care provider. Your health care provider will ask about aspects of your daily life, such as mood, sleep, and appetite, to see if you have the diagnostic symptoms of major depressive disorder. Your health care provider may ask about your medical history and use of alcohol or drugs, including prescription medicines. Your health care provider also may do a physical exam and blood work. This is because certain medical conditions and the use of certain substances can cause major depressive disorder-like symptoms (secondary depression). Your health care provider also may refer you to a mental health specialist for further evaluation and treatment. °TREATMENT °It is important to recognize the symptoms of major depressive disorder and seek treatment. The following treatments can be prescribed for this disorder:   °· Medicine. Antidepressant medicines usually are prescribed. Antidepressant medicines are thought to correct chemical imbalances in the brain that are commonly associated with major depressive disorder. Other types of medicine may be added if the symptoms do not respond to antidepressant medicines alone or if psychotic delusions or hallucinations occur. °· Talk therapy. Talk therapy can be  helpful in treating major depressive disorder by providing   support, education, and guidance. Certain types of talk therapy also can help with negative thinking (cognitive behavioral therapy) and with relationship issues that trigger this disorder (interpersonal therapy). °A mental health specialist can help determine which treatment is best for you. Most people with major depressive disorder do well with a combination of medicine and talk therapy. Treatments involving electrical stimulation of the brain can be used in situations with extremely severe symptoms or when medicine and talk therapy do not work over time. These treatments include electroconvulsive therapy, transcranial magnetic stimulation, and vagal nerve stimulation. °  °This information is not intended to replace advice given to you by your health care provider. Make sure you discuss any questions you have with your health care provider. °  °Document Released: 08/01/2012 Document Revised: 04/27/2014 Document Reviewed: 08/01/2012 °Elsevier Interactive Patient Education ©2016 Elsevier Inc. ° ° °Emergency Department Resource Guide °1) Find a Doctor and Pay Out of Pocket °Although you won't have to find out who is covered by your insurance plan, it is a good idea to ask around and get recommendations. You will then need to call the office and see if the doctor you have chosen will accept you as a new patient and what types of options they offer for patients who are self-pay. Some doctors offer discounts or will set up payment plans for their patients who do not have insurance, but you will need to ask so you aren't surprised when you get to your appointment. ° °2) Contact Your Local Health Department °Not all health departments have doctors that can see patients for sick visits, but many do, so it is worth a call to see if yours does. If you don't know where your local health department is, you can check in your phone book. The CDC also has a tool to help  you locate your state's health department, and many state websites also have listings of all of their local health departments. ° °3) Find a Walk-in Clinic °If your illness is not likely to be very severe or complicated, you may want to try a walk in clinic. These are popping up all over the country in pharmacies, drugstores, and shopping centers. They're usually staffed by nurse practitioners or physician assistants that have been trained to treat common illnesses and complaints. They're usually fairly quick and inexpensive. However, if you have serious medical issues or chronic medical problems, these are probably not your best option. ° °No Primary Care Doctor: °- Call [facility] at  832-8000 - they can help you locate a primary care doctor that  accepts your insurance, provides certain services, etc. °- Physician Referral Service- 1-800-533-3463 ° °Chronic Pain Problems: °Organization         Address  Phone   Notes  °[facility]  (336) 297-2271 Patients need to be referred by their primary care doctor.  ° °Medication Assistance: °Organization         Address  Phone   Notes  °[place] [facility] [address] °[place], [place] [address] (336) 641-8030 --Must be a resident of [place] °-- Must have NO insurance coverage whatsoever (no Medicaid/ Medicare, etc.) °-- The pt. MUST have a primary care doctor that directs their care regularly and follows them in the community °  °[facility]  (866) 331-1348   °[employer]  (888) 892-1162   ° °Agencies that provide inexpensive medical care: °Organization         Address  Phone     Notes  °[facility]  (336) 832-8035   °[facility]    (336) 832-7272   °[facility] [address] °[place], [place] [address] (336) 832-4777   °[facility] [address], °[place] (336) 271-4999   °[facility]    (336) 373-0678   °[facility]    (336) 272-1050   °[facility] ° [address], [place] Phone:  (336) 832-4444, Fax:  (336) 832-4440 Hours of Operation:  9 am - 6 pm, M-F.  Also accepts Medicaid/Medicare and self-pay.  °[facility] ° [address], [place] Phone: (336) 832-3150, Fax: (336) 832-3151. Hours of Operation:  8:30 am - 5:30 pm, M-F.  Also accepts Medicaid and self-pay.  °[facility] [place] [address], [place] Phone: (336) 878-6027   °[facility] [address], [place], [place] (336)723-1848, Ext. 123 Mondays & Thursdays: 7-9 AM.  First 15 patients are seen on a first come, first serve basis. °  ° °Medicaid-accepting [place] Providers: ° °Organization         Address  Phone   Notes  °[facility] [address], [place] (336) 641-2100 Also accepts self-pay patients.  °[facility] [address], [place] ° (336) 856-9996   °[facility] [address], [place] (336) 288-8857   °[facility] [address] [place] [address], [place] (336) 299-7000   °Veita Bland [address], [place]  ° (336) 373-1557 Only accepts [place] Access Medicaid patients after they have their name applied to their card.  ° °Self-Pay (no insurance) in [place]: ° °Organization         Address  Phone   Notes  °Sickle Cell Patients, [facility] [address], [place] (336) 832-1970   °[facility] [address], [place] (336) 832-4400   °[facility] ° [address], [place] (336) 992-4800   °[facility]/Dr. Osei-Bonsu ° [address] [place] [address], [place] or [address], [place] (336) 841-8500 Phone number for both [place] and [place] locations is the same.  °[facility] [address],  [place] (336) 299-0000   °[facility] [address] [place] [address], [place] or [address] (336) 852-7530 °(336) 878-2260   °[facility] [address], [place] (336) 350-1642, phone; (336) 294-5005, fax Sees patients 1st and 3rd Saturday of every month.  Must not qualify for public or private insurance (i.e. Medicaid, Medicare, [place] Health Choice, Veterans' Benefits) • Household income should be no more than 200% of the poverty level •The clinic cannot treat you if you are pregnant or think you are pregnant • Sexually transmitted diseases are not treated at the clinic.  ° ° °Dental Care: °Organization         Address  Phone  Notes  °[place] [facility] [address],  (336) 641-6152 Accepts children up to age 21 who are enrolled in Medicaid or [place] Health Choice; pregnant women with a Medicaid card; and children who have applied for Medicaid or [place] Health Choice, but were declined, whose parents can pay a reduced fee at time of service.  °[place]   [facility] [place]  [address], [place] (336) 641-7733 Accepts children up to age 21 who are enrolled in Medicaid or [place] Health Choice; pregnant women with a Medicaid card; and children who have applied for Medicaid or [place] Health Choice, but were declined, whose parents can pay a reduced fee at time of service.  °[facility] ° [address], [place] (336) 641-4533 Patients are seen by appointment only. Walk-ins are not accepted. [facility] will see patients 18 years of age and older. °Monday - Tuesday (8am-5pm) °Most Wednesdays (8:30-5pm) °$30 per visit, cash only  °[facility] ° [address], [place] (336) 641-4533 Patients are seen by appointment only. Walk-ins are not accepted. [facility] will see patients 18 years of age and older. °One Wednesday Evening  (Monthly: Volunteer Based).  $30 per visit, cash only  °[facility]  (919) 537-3737 for adults; Children under age 4, call Graduate Pediatric Dentistry at ([phone]) [phone]. Children aged 4-14, please call (919) [phone] to request a pediatric application. ° Dental services are provided in all areas of dental care including fillings, crowns and bridges, complete and partial dentures, implants, gum treatment, root canals, and extractions. Preventive care is also provided. Treatment is provided to both adults and children. °Patients are selected via a lottery and there is often a waiting list. °  °[facility] [address], °[place] ° (336) 763-8833 [email or web address] °  °[facility] [address], [place], [place] (336)723-1848, Ext. 123 Second and Fourth Thursday of each month, opens at 6:30 AM; Clinic ends at 9 AM.  Patients are seen on a first-come first-served basis, and a limited number are seen during each clinic.  ° °[facility] ° [address], [place], [place] (336) 723-7904   Eligibility Requirements °You must have lived in [place], or [place] counties for at least the last three months. °  You cannot be eligible for state or federal sponsored healthcare insurance, including Veterans Administration, Medicaid, or Medicare. °  You generally cannot be eligible for healthcare insurance through your employer.  °  How to apply: °Eligibility screenings are held every Tuesday and Wednesday afternoon from 1:00 pm until 4:00 pm. You do not need an appointment for the interview!  °[facility] [address], [place], [place] 336-631-2330   °[facility]  336-342-8273   °[facility]  336-703-3100   °[facility]  336-570-6415   ° °Behavioral Health Resources in the Community: °Intensive Outpatient Programs °Organization         Address  Phone  Notes  °[place]  [facility] [address], [place], [place] 336-878-6098   °[facility] [address], [place], [place] 336-832-9800   °[facility] [address], [place], [place] ° 336-882-2125   °[facility] [address],  °[place], [place] 1-800-853-5163 or 336-641-4981   °Substance Abuse Resources °Organization         Address  Phone  Notes  °[facility]  336-882-2125   °[facility]  336-784-9470   °[facility]  336-285-9073   °[facility]  336-845-3988   °Residential & Outpatient Substance Abuse Program  1-800-659-3381   °Psychological Services °Organization         Address  Phone  Notes  °[facility]  336- 832-9600   °[facility]  336- 378-7881   °[facility] [address], [place] 1-800-853-5163 or 336-641-4981   ° °Mobile Crisis Teams °Organization         Address  Phone  Notes  °[facility], Mobile Crisis Care Unit  1-877-626-1772   °Assertive °[facility] ° [address] [place], [place] 336-834-9664   °Sharon DeEsch [address] °[place] 336-554-5454   ° °Self-Help/Support Groups °Organization         Address  Phone             Notes  °[facility] - variety of support groups  336- 373-1402 Call for more information  °Narcotics Anonymous (NA), [facility] [address], °[place]  2 meetings at this location  ° °Residential Treatment Programs °Organization         Address  Phone  Notes  °[facility] [address],    °[place]  1-866-801-8205   °[facility] ° [address], [place], [place] 704-293-8524   °[facility] [address], [place] 336-845-3988 Admissions: 8am-3pm M-F  °[facility] [address]    °[place], [place] 336-841-1104   °[facility] [address],  [place], [place] 336-379-7146   °[facility] [address]  °[place], [place] 336-285-9073   °[facility] - Intensive Outpatient [address], [place], [place] 336-852-3033   °[facility] [address]  °[place], [place] 1-877-615-2722 or 336-784-9470   °[facility] [address] [place], [place] 336-227-7417 Accepts Medicaid  °[facility] [address]  °[place] 1-800-659-3381 Substance Abuse/Addiction Treatment  ° °[place] Behavioral Health Resources °Organization         Address  Phone  Notes  °[facility]  (888) 581-9988   °Julie Brannon, PhD [address] [place], [place]   (336) 349-5553 or (336) 951-0000   °[facility]   [address] °[place], [place] (336) 349-4454   °[facility] [address], [place], [place] (336) 342-8316 Insurance/Medicaid/sponsorship through [facility]  °[facility] [address]                                    [place], [place] (336) 342-8316 Therapy/tele-psych/case  °[facility] [address]  ° [place],  (336) 349-2233    °Dr. Arfeen  (336) 349-4544   °[facility] [place]  [employer] [place] [facility] 1) [address], [place] °2) [address], [place] °3)  [address], [place] (336) 349-3220 °(336) 342-7768 ° °(336) 342-8140   °[place] [facility] (336) 342-1394 or (336) 342-3537 (After Hours)    ° ° ° °
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
## 2017-06-23 IMAGING — CR DG FEMUR 2+V*R*
4 series · 4 of 4 positions shown · non-contrast
Comparison: None.

CLINICAL DATA: Right medial knee pain and redness.

EXAM:
RIGHT FEMUR 2 VIEWS

[femur ap (1 of 2)]
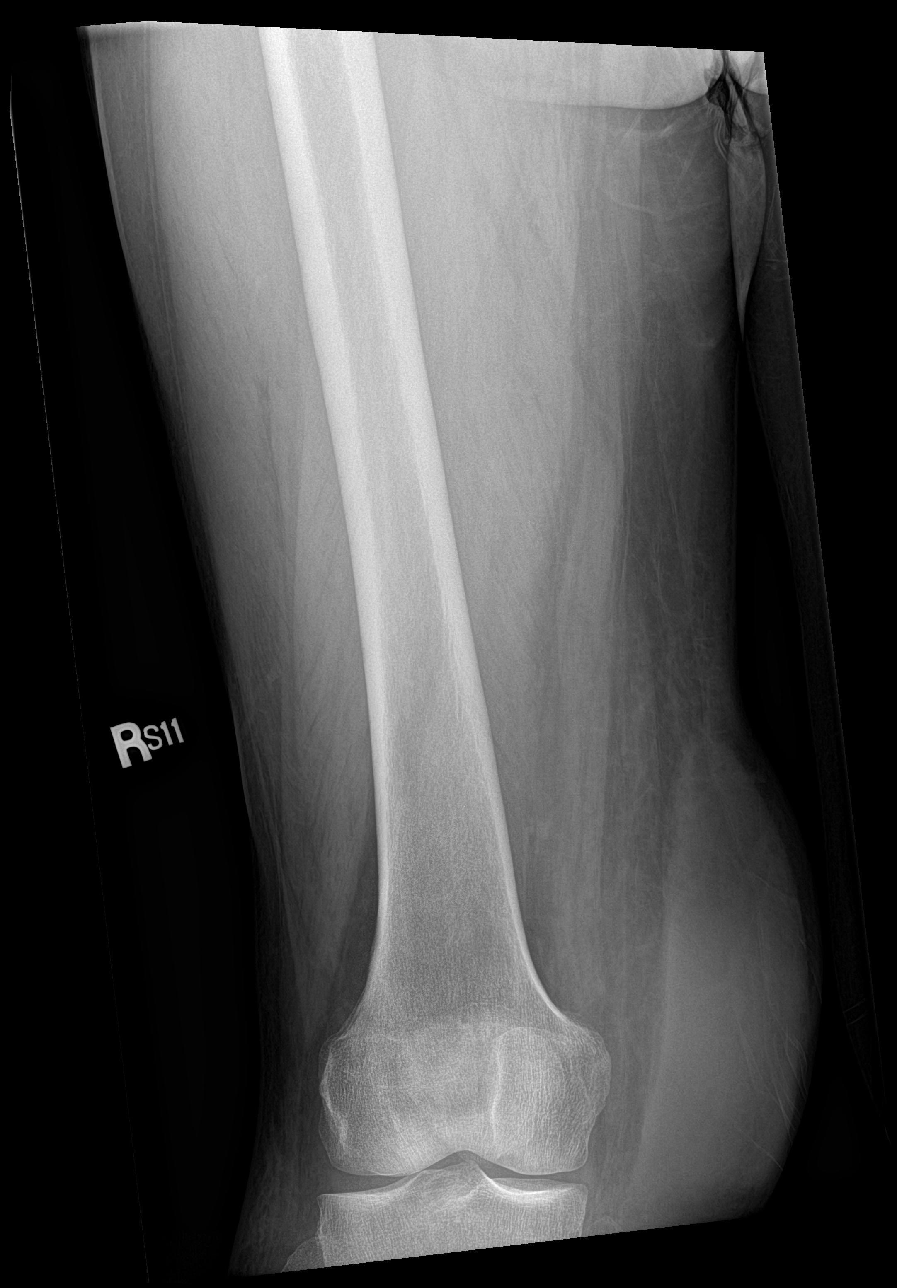

[femur ap (2 of 2)]
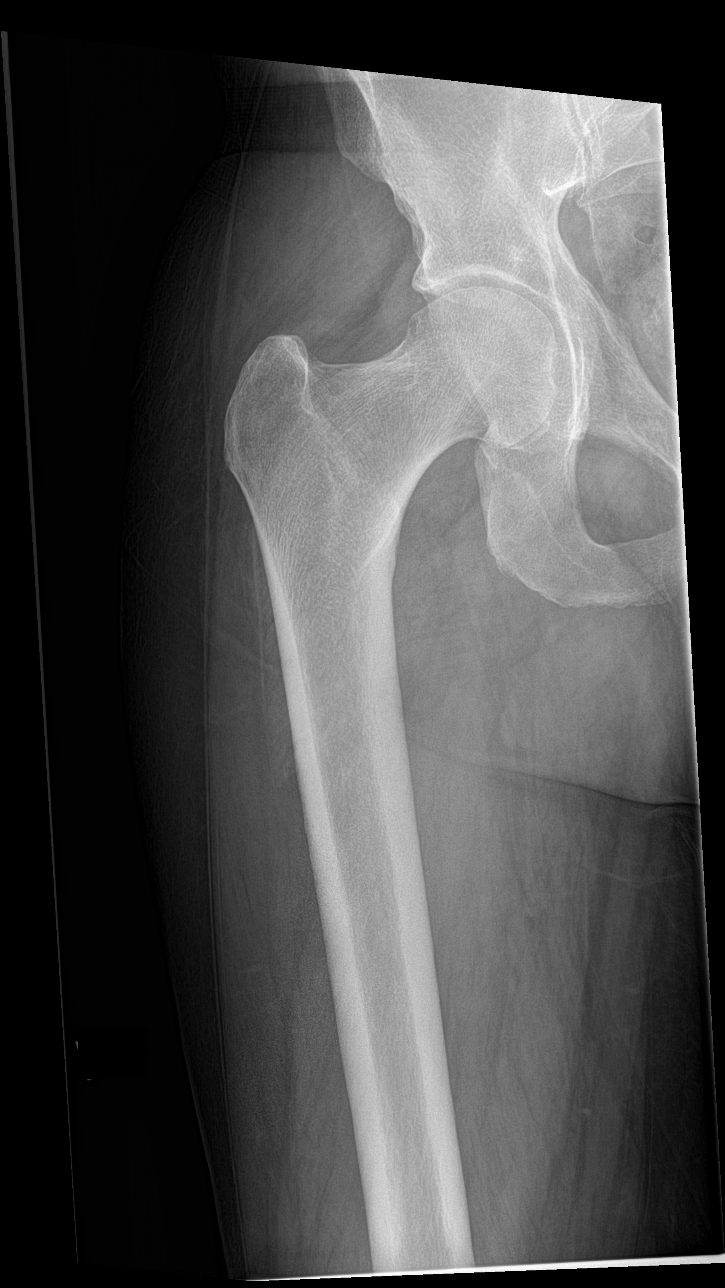

[femur lat (1 of 2)]
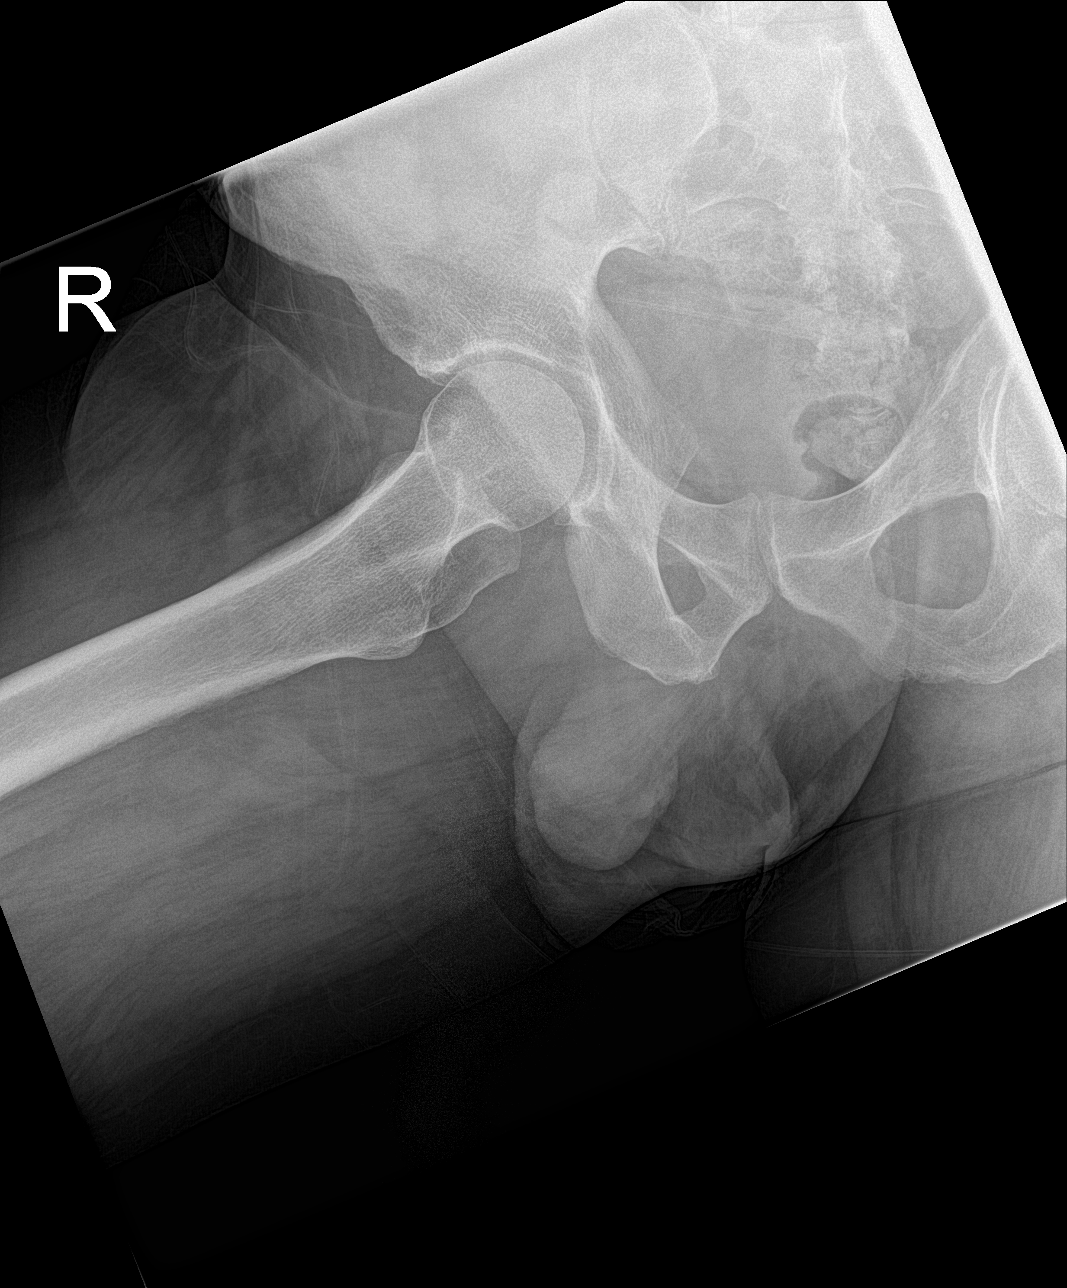

[femur lat (2 of 2)]
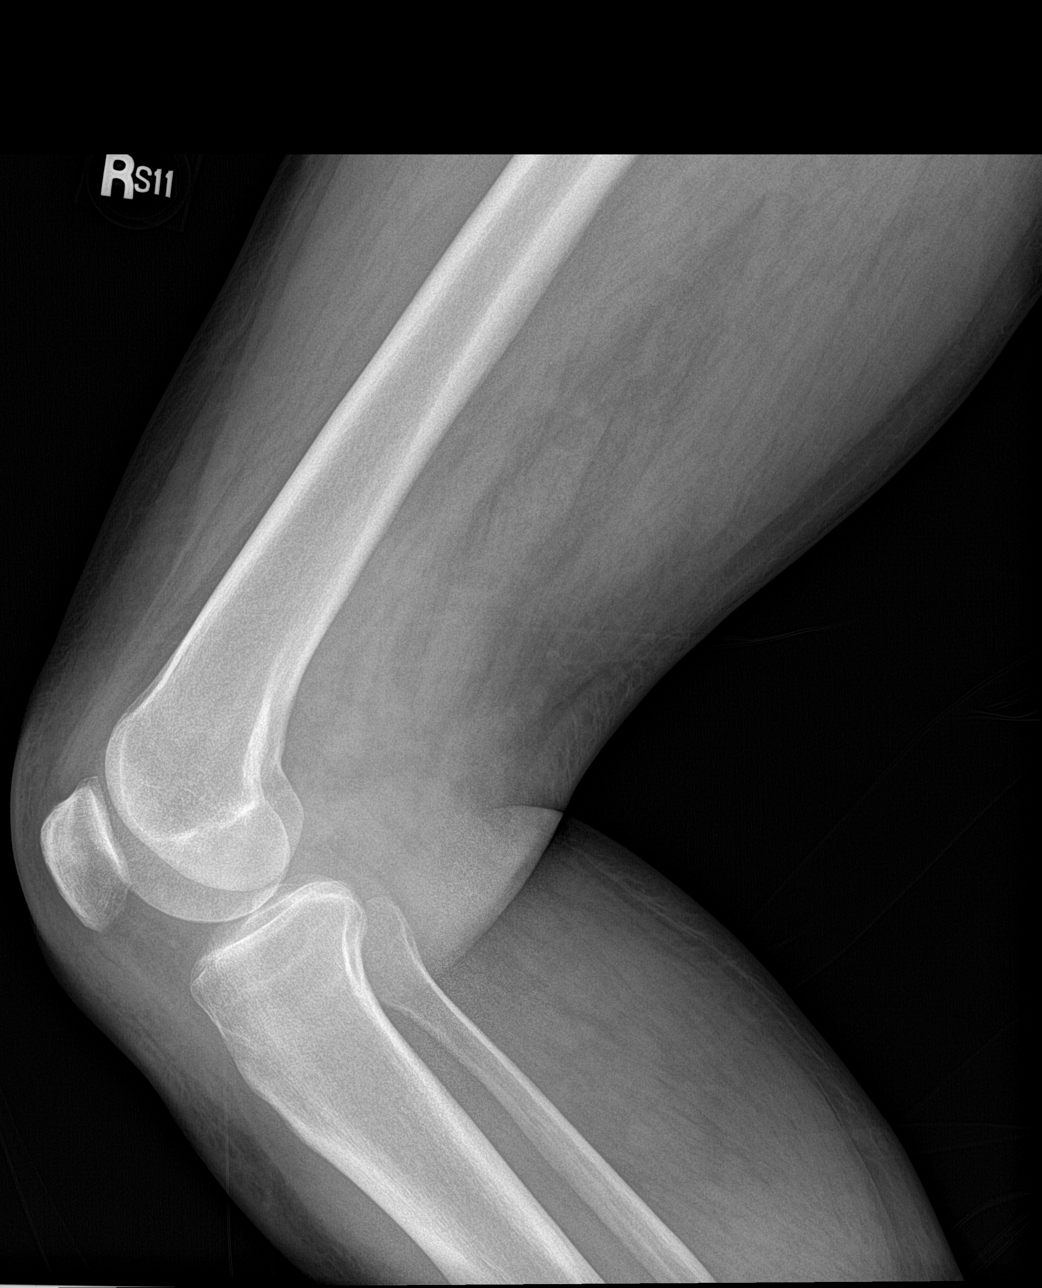

[4 of 4 positions shown; findings below may reference images not displayed]

FINDINGS: There is no evidence of fracture or other focal bone lesions. Focal
soft tissue opacity along the medial aspect of the distal femur and
knee, within the subcutaneous fat which may reflect an abscess or
hematoma.
IMPRESSION: Focal soft tissue opacity along the medial aspect of the distal
femur and knee, within the subcutaneous fat which may reflect an
abscess or hematoma.
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
## 2022-09-12 ENCOUNTER — Emergency Department (HOSPITAL_COMMUNITY): Payer: Medicaid Other

## 2022-09-12 ENCOUNTER — Inpatient Hospital Stay (HOSPITAL_COMMUNITY): Payer: Medicaid Other | Admitting: Certified Registered Nurse Anesthetist

## 2022-09-12 ENCOUNTER — Other Ambulatory Visit: Payer: Self-pay

## 2022-09-12 ENCOUNTER — Inpatient Hospital Stay (HOSPITAL_COMMUNITY)
Admission: EM | Admit: 2022-09-12 | Discharge: 2022-09-15 | DRG: 605 | Disposition: A | Discharge status: Other Acute Inpatient Hospital | Payer: Medicaid Other | Attending: Surgery | Admitting: Surgery

## 2022-09-12 ENCOUNTER — Encounter (HOSPITAL_COMMUNITY)
Admission: EM | Disposition: A | Discharge status: Other Acute Inpatient Hospital | Payer: Self-pay | Source: Home / Self Care

## 2022-09-12 ENCOUNTER — Encounter (HOSPITAL_COMMUNITY): Payer: Self-pay | Admitting: Surgery

## 2022-09-12 DIAGNOSIS — S1191XA Laceration without foreign body of unspecified part of neck, initial encounter: Secondary | ICD-10-CM

## 2022-09-12 DIAGNOSIS — F32A Depression, unspecified: Secondary | ICD-10-CM

## 2022-09-12 DIAGNOSIS — T1491XA Suicide attempt, initial encounter: Principal | ICD-10-CM

## 2022-09-12 DIAGNOSIS — G479 Sleep disorder, unspecified: Secondary | ICD-10-CM | POA: Diagnosis present

## 2022-09-12 DIAGNOSIS — Z560 Unemployment, unspecified: Secondary | ICD-10-CM

## 2022-09-12 DIAGNOSIS — F17210 Nicotine dependence, cigarettes, uncomplicated: Secondary | ICD-10-CM | POA: Diagnosis present

## 2022-09-12 DIAGNOSIS — X789XXA Intentional self-harm by unspecified sharp object, initial encounter: Secondary | ICD-10-CM | POA: Diagnosis present

## 2022-09-12 DIAGNOSIS — R739 Hyperglycemia, unspecified: Secondary | ICD-10-CM | POA: Diagnosis present

## 2022-09-12 DIAGNOSIS — Z9151 Personal history of suicidal behavior: Secondary | ICD-10-CM

## 2022-09-12 DIAGNOSIS — Z23 Encounter for immunization: Secondary | ICD-10-CM

## 2022-09-12 DIAGNOSIS — F419 Anxiety disorder, unspecified: Secondary | ICD-10-CM | POA: Diagnosis present

## 2022-09-12 DIAGNOSIS — N179 Acute kidney failure, unspecified: Secondary | ICD-10-CM | POA: Diagnosis present

## 2022-09-12 DIAGNOSIS — I959 Hypotension, unspecified: Secondary | ICD-10-CM | POA: Diagnosis present

## 2022-09-12 DIAGNOSIS — S1193XA Puncture wound without foreign body of unspecified part of neck, initial encounter: Secondary | ICD-10-CM

## 2022-09-12 DIAGNOSIS — T797XXA Traumatic subcutaneous emphysema, initial encounter: Secondary | ICD-10-CM | POA: Diagnosis present

## 2022-09-12 DIAGNOSIS — F332 Major depressive disorder, recurrent severe without psychotic features: Secondary | ICD-10-CM | POA: Diagnosis not present

## 2022-09-12 HISTORY — PX: FACIAL LACERATION REPAIR: SHX6589

## 2022-09-12 HISTORY — DX: Depression, unspecified: F32.A

## 2022-09-12 HISTORY — PX: ESOPHAGOSCOPY: SHX5534

## 2022-09-12 HISTORY — PX: DIRECT LARYNGOSCOPY: SHX5326

## 2022-09-12 LAB — COMPREHENSIVE METABOLIC PANEL
ALT: 25 U/L (ref 0–44)
AST: 22 U/L (ref 15–41)
Albumin: 3 g/dL — ABNORMAL LOW (ref 3.5–5.0)
Alkaline Phosphatase: 63 U/L (ref 38–126)
Anion gap: 10 (ref 5–15)
BUN: 19 mg/dL (ref 6–20)
CO2: 15 mmol/L — ABNORMAL LOW (ref 22–32)
Calcium: 8.1 mg/dL — ABNORMAL LOW (ref 8.9–10.3)
Chloride: 108 mmol/L (ref 98–111)
Creatinine, Ser: 1.88 mg/dL — ABNORMAL HIGH (ref 0.61–1.24)
GFR, Estimated: 41 mL/min — ABNORMAL LOW (ref >60)
Glucose, Bld: 218 mg/dL — ABNORMAL HIGH (ref 70–99)
Potassium: 4.3 mmol/L (ref 3.5–5.1)
Sodium: 133 mmol/L — ABNORMAL LOW (ref 135–145)
Total Bilirubin: 0.7 mg/dL (ref 0.3–1.2)
Total Protein: 5.3 g/dL — ABNORMAL LOW (ref 6.5–8.1)

## 2022-09-12 LAB — URINALYSIS, ROUTINE W REFLEX MICROSCOPIC
Bacteria, UA: NONE SEEN
Bilirubin Urine: NEGATIVE
Glucose, UA: 50 mg/dL — AB
Ketones, ur: NEGATIVE mg/dL
Leukocytes,Ua: NEGATIVE
Nitrite: NEGATIVE
Protein, ur: NEGATIVE mg/dL
Specific Gravity, Urine: 1.015 (ref 1.005–1.030)
pH: 6 (ref 5.0–8.0)

## 2022-09-12 LAB — I-STAT CHEM 8, ED
BUN: 20 mg/dL (ref 6–20)
Calcium, Ion: 1.06 mmol/L — ABNORMAL LOW (ref 1.15–1.40)
Chloride: 110 mmol/L (ref 98–111)
Creatinine, Ser: 1.7 mg/dL — ABNORMAL HIGH (ref 0.61–1.24)
Glucose, Bld: 208 mg/dL — ABNORMAL HIGH (ref 70–99)
HCT: 37 % — ABNORMAL LOW (ref 39.0–52.0)
Hemoglobin: 12.6 g/dL — ABNORMAL LOW (ref 13.0–17.0)
Potassium: 4.3 mmol/L (ref 3.5–5.1)
Sodium: 138 mmol/L (ref 135–145)
TCO2: 16 mmol/L — ABNORMAL LOW (ref 22–32)

## 2022-09-12 LAB — TYPE AND SCREEN: Unit division: 0

## 2022-09-12 LAB — CBC
HCT: 39 % (ref 39.0–52.0)
Hemoglobin: 13.3 g/dL (ref 13.0–17.0)
MCH: 30.2 pg (ref 26.0–34.0)
MCHC: 34.1 g/dL (ref 30.0–36.0)
MCV: 88.6 fL (ref 80.0–100.0)
Platelets: 270 10*3/uL (ref 150–400)
RBC: 4.4 MIL/uL (ref 4.22–5.81)
RDW: 12.2 % (ref 11.5–15.5)
WBC: 16.9 10*3/uL — ABNORMAL HIGH (ref 4.0–10.5)
nRBC: 0 % (ref 0.0–0.2)

## 2022-09-12 LAB — LACTIC ACID, PLASMA: Lactic Acid, Venous: 3 mmol/L (ref 0.5–1.9)

## 2022-09-12 LAB — ETHANOL: Alcohol, Ethyl (B): <10 mg/dL (ref <10)

## 2022-09-12 LAB — BPAM RBC

## 2022-09-12 LAB — PREPARE RBC (CROSSMATCH)

## 2022-09-12 LAB — PROTIME-INR
INR: 1.3 — ABNORMAL HIGH (ref 0.8–1.2)
Prothrombin Time: 15.9 seconds — ABNORMAL HIGH (ref 11.4–15.2)

## 2022-09-12 LAB — ABO/RH: ABO/RH(D): A POS

## 2022-09-12 SURGERY — LARYNGOSCOPY, DIRECT
Anesthesia: General | Site: Neck

## 2022-09-12 MED ORDER — LIDOCAINE-EPINEPHRINE 1 %-1:100000 IJ SOLN
INTRAMUSCULAR | Status: AC
  Filled 2022-09-12: qty 1

## 2022-09-12 MED ORDER — SUCCINYLCHOLINE CHLORIDE 200 MG/10ML IV SOSY
PREFILLED_SYRINGE | INTRAVENOUS | Status: DC | PRN
  Administered 2022-09-12: 120 mg via INTRAVENOUS

## 2022-09-12 MED ORDER — ONDANSETRON HCL 4 MG/2ML IJ SOLN
INTRAMUSCULAR | Status: AC
  Filled 2022-09-12: qty 2

## 2022-09-12 MED ORDER — ACETAMINOPHEN 10 MG/ML IV SOLN
INTRAVENOUS | Status: AC
  Filled 2022-09-12: qty 100

## 2022-09-12 MED ORDER — HYDROMORPHONE HCL 1 MG/ML IJ SOLN
INTRAMUSCULAR | Status: AC
  Filled 2022-09-12: qty 1

## 2022-09-12 MED ORDER — SODIUM CHLORIDE 0.9 % IV SOLN: INTRAVENOUS | Status: DC | PRN

## 2022-09-12 MED ORDER — ROCURONIUM BROMIDE 10 MG/ML (PF) SYRINGE
PREFILLED_SYRINGE | INTRAVENOUS | Status: DC | PRN
  Administered 2022-09-12: 20 mg via INTRAVENOUS
  Administered 2022-09-12: 40 mg via INTRAVENOUS

## 2022-09-12 MED ORDER — MIDAZOLAM HCL 2 MG/2ML IJ SOLN
INTRAMUSCULAR | Status: DC | PRN
  Administered 2022-09-12: 2 mg via INTRAVENOUS

## 2022-09-12 MED ORDER — ACETAMINOPHEN 500 MG PO TABS
1000.0000 mg | ORAL_TABLET | Freq: Four times a day (QID) | ORAL | Status: DC
  Administered 2022-09-13 – 2022-09-15 (×10): 1000 mg via ORAL
  Filled 2022-09-12 (×10): qty 2

## 2022-09-12 MED ORDER — FENTANYL CITRATE (PF) 250 MCG/5ML IJ SOLN
INTRAMUSCULAR | Status: DC | PRN
  Administered 2022-09-12 (×2): 50 ug via INTRAVENOUS

## 2022-09-12 MED ORDER — MORPHINE SULFATE (PF) 2 MG/ML IV SOLN: 2.0000 mg | INTRAVENOUS | Status: DC | PRN

## 2022-09-12 MED ORDER — LIDOCAINE 2% (20 MG/ML) 5 ML SYRINGE
INTRAMUSCULAR | Status: DC | PRN
  Administered 2022-09-12: 40 mg via INTRAVENOUS

## 2022-09-12 MED ORDER — LIDOCAINE-EPINEPHRINE 1 %-1:100000 IJ SOLN
INTRAMUSCULAR | Status: DC | PRN
  Administered 2022-09-12: 4 mL

## 2022-09-12 MED ORDER — SUGAMMADEX SODIUM 200 MG/2ML IV SOLN
INTRAVENOUS | Status: DC | PRN
  Administered 2022-09-12: 200 mg via INTRAVENOUS

## 2022-09-12 MED ORDER — PROPOFOL 10 MG/ML IV BOLUS
INTRAVENOUS | Status: AC
  Filled 2022-09-12: qty 20

## 2022-09-12 MED ORDER — FENTANYL CITRATE (PF) 250 MCG/5ML IJ SOLN
INTRAMUSCULAR | Status: AC
  Filled 2022-09-12: qty 5

## 2022-09-12 MED ORDER — ORAL CARE MOUTH RINSE: 15.0000 mL | Freq: Once | OROMUCOSAL | Status: DC

## 2022-09-12 MED ORDER — ACETAMINOPHEN 325 MG PO TABS: 325.0000 mg | ORAL_TABLET | Freq: Once | ORAL | Status: DC | PRN

## 2022-09-12 MED ORDER — 0.9 % SODIUM CHLORIDE (POUR BTL) OPTIME
TOPICAL | Status: DC | PRN
  Administered 2022-09-12: 1000 mL

## 2022-09-12 MED ORDER — ACETAMINOPHEN 10 MG/ML IV SOLN
1000.0000 mg | Freq: Once | INTRAVENOUS | Status: DC | PRN
  Administered 2022-09-12: 1000 mg via INTRAVENOUS

## 2022-09-12 MED ORDER — ALBUMIN HUMAN 5 % IV SOLN: INTRAVENOUS | Status: DC | PRN

## 2022-09-12 MED ORDER — CEFAZOLIN SODIUM-DEXTROSE 2-4 GM/100ML-% IV SOLN
2.0000 g | Freq: Once | INTRAVENOUS | Status: AC
  Administered 2022-09-12: 2 g via INTRAVENOUS

## 2022-09-12 MED ORDER — HYDRALAZINE HCL 20 MG/ML IJ SOLN: 10.0000 mg | INTRAMUSCULAR | Status: DC | PRN

## 2022-09-12 MED ORDER — MIDAZOLAM HCL 2 MG/2ML IJ SOLN
INTRAMUSCULAR | Status: AC
  Filled 2022-09-12: qty 2

## 2022-09-12 MED ORDER — ENOXAPARIN SODIUM 30 MG/0.3ML IJ SOSY
30.0000 mg | PREFILLED_SYRINGE | Freq: Two times a day (BID) | INTRAMUSCULAR | Status: DC
  Administered 2022-09-13 (×2): 30 mg via SUBCUTANEOUS
  Filled 2022-09-12 (×3): qty 0.3

## 2022-09-12 MED ORDER — SODIUM CHLORIDE 0.9% IV SOLUTION: Freq: Once | INTRAVENOUS | Status: DC

## 2022-09-12 MED ORDER — METHOCARBAMOL 500 MG PO TABS
500.0000 mg | ORAL_TABLET | Freq: Three times a day (TID) | ORAL | Status: AC
  Administered 2022-09-12 – 2022-09-15 (×9): 500 mg via ORAL
  Filled 2022-09-12 (×9): qty 1

## 2022-09-12 MED ORDER — ROCURONIUM BROMIDE 10 MG/ML (PF) SYRINGE
PREFILLED_SYRINGE | INTRAVENOUS | Status: AC
  Filled 2022-09-12: qty 10

## 2022-09-12 MED ORDER — SUCCINYLCHOLINE CHLORIDE 200 MG/10ML IV SOSY
PREFILLED_SYRINGE | INTRAVENOUS | Status: AC
  Filled 2022-09-12: qty 10

## 2022-09-12 MED ORDER — ONDANSETRON HCL 4 MG/2ML IJ SOLN: 4.0000 mg | Freq: Four times a day (QID) | INTRAMUSCULAR | Status: DC | PRN

## 2022-09-12 MED ORDER — TETANUS-DIPHTH-ACELL PERTUSSIS 5-2.5-18.5 LF-MCG/0.5 IM SUSY
0.5000 mL | PREFILLED_SYRINGE | Freq: Once | INTRAMUSCULAR | Status: AC
  Administered 2022-09-12: 0.5 mL via INTRAMUSCULAR
  Filled 2022-09-12: qty 0.5

## 2022-09-12 MED ORDER — LACTATED RINGERS IV SOLN: INTRAVENOUS | Status: DC

## 2022-09-12 MED ORDER — ACETAMINOPHEN 160 MG/5ML PO SOLN: 325.0000 mg | Freq: Once | ORAL | Status: DC | PRN

## 2022-09-12 MED ORDER — SODIUM CHLORIDE 0.9 % IV BOLUS
1000.0000 mL | Freq: Once | INTRAVENOUS | Status: AC
  Administered 2022-09-12: 1000 mL via INTRAVENOUS

## 2022-09-12 MED ORDER — LACTATED RINGERS IV SOLN: INTRAVENOUS | Status: DC | PRN

## 2022-09-12 MED ORDER — DOCUSATE SODIUM 100 MG PO CAPS
100.0000 mg | ORAL_CAPSULE | Freq: Two times a day (BID) | ORAL | Status: DC
  Administered 2022-09-12 – 2022-09-13 (×3): 100 mg via ORAL
  Filled 2022-09-12 (×4): qty 1

## 2022-09-12 MED ORDER — IOHEXOL 350 MG/ML SOLN
75.0000 mL | Freq: Once | INTRAVENOUS | Status: AC | PRN
  Administered 2022-09-12: 75 mL via INTRAVENOUS

## 2022-09-12 MED ORDER — METOPROLOL TARTRATE 5 MG/5ML IV SOLN: 5.0000 mg | Freq: Four times a day (QID) | INTRAVENOUS | Status: DC | PRN

## 2022-09-12 MED ORDER — POLYETHYLENE GLYCOL 3350 17 G PO PACK: 17.0000 g | PACK | Freq: Every day | ORAL | Status: DC | PRN

## 2022-09-12 MED ORDER — DEXTROSE-SODIUM CHLORIDE 5-0.45 % IV SOLN: INTRAVENOUS | Status: DC

## 2022-09-12 MED ORDER — ONDANSETRON 4 MG PO TBDP: 4.0000 mg | ORAL_TABLET | Freq: Four times a day (QID) | ORAL | Status: DC | PRN

## 2022-09-12 MED ORDER — ONDANSETRON HCL 4 MG/2ML IJ SOLN
INTRAMUSCULAR | Status: DC | PRN
  Administered 2022-09-12: 4 mg via INTRAVENOUS

## 2022-09-12 MED ORDER — DEXAMETHASONE SODIUM PHOSPHATE 10 MG/ML IJ SOLN
INTRAMUSCULAR | Status: DC | PRN
  Administered 2022-09-12: 10 mg via INTRAVENOUS

## 2022-09-12 MED ORDER — PROMETHAZINE HCL 25 MG/ML IJ SOLN: 6.2500 mg | INTRAMUSCULAR | Status: DC | PRN

## 2022-09-12 MED ORDER — LIDOCAINE 2% (20 MG/ML) 5 ML SYRINGE
INTRAMUSCULAR | Status: AC
  Filled 2022-09-12: qty 5

## 2022-09-12 MED ORDER — AMISULPRIDE (ANTIEMETIC) 5 MG/2ML IV SOLN: 10.0000 mg | Freq: Once | INTRAVENOUS | Status: DC | PRN

## 2022-09-12 MED ORDER — PHENYLEPHRINE 80 MCG/ML (10ML) SYRINGE FOR IV PUSH (FOR BLOOD PRESSURE SUPPORT)
PREFILLED_SYRINGE | INTRAVENOUS | Status: DC | PRN
  Administered 2022-09-12: 160 ug via INTRAVENOUS
  Administered 2022-09-12: 80 ug via INTRAVENOUS
  Administered 2022-09-12: 160 ug via INTRAVENOUS

## 2022-09-12 MED ORDER — HYDROMORPHONE HCL 1 MG/ML IJ SOLN
0.2500 mg | INTRAMUSCULAR | Status: DC | PRN
  Administered 2022-09-12 (×2): 0.5 mg via INTRAVENOUS

## 2022-09-12 MED ORDER — CHLORHEXIDINE GLUCONATE 0.12 % MT SOLN
15.0000 mL | Freq: Once | OROMUCOSAL | Status: DC
  Administered 2022-09-12: 15 mL via OROMUCOSAL

## 2022-09-12 MED ORDER — DEXAMETHASONE SODIUM PHOSPHATE 10 MG/ML IJ SOLN
INTRAMUSCULAR | Status: AC
  Filled 2022-09-12: qty 1

## 2022-09-12 MED ORDER — PHENYLEPHRINE 80 MCG/ML (10ML) SYRINGE FOR IV PUSH (FOR BLOOD PRESSURE SUPPORT)
PREFILLED_SYRINGE | INTRAVENOUS | Status: AC
  Filled 2022-09-12: qty 10

## 2022-09-12 MED ORDER — METHOCARBAMOL 1000 MG/10ML IJ SOLN
500.0000 mg | Freq: Three times a day (TID) | INTRAVENOUS | Status: AC
  Filled 2022-09-12: qty 5

## 2022-09-12 MED ORDER — PROPOFOL 10 MG/ML IV BOLUS
INTRAVENOUS | Status: DC | PRN
  Administered 2022-09-12: 150 mg via INTRAVENOUS

## 2022-09-12 SURGICAL SUPPLY — 48 items
BAG COUNTER SPONGE SURGICOUNT (BAG) ×4 IMPLANT
BAG SPNG CNTER NS LX DISP (BAG) ×3
BALLN PULM 15 16.5 18X75 (BALLOONS)
BALLOON PULM 15 16.5 18X75 (BALLOONS) IMPLANT
BLADE SURG 15 STRL LF DISP TIS (BLADE) IMPLANT
BLADE SURG 15 STRL SS (BLADE) ×3
BNDG GAUZE DERMACEA FLUFF 4 (GAUZE/BANDAGES/DRESSINGS) IMPLANT
BNDG GZE DERMACEA 4 6PLY (GAUZE/BANDAGES/DRESSINGS) ×3
CANISTER SUCT 3000ML PPV (MISCELLANEOUS) ×4 IMPLANT
CNTNR URN SCR LID CUP LEK RST (MISCELLANEOUS) IMPLANT
CONT SPEC 4OZ STRL OR WHT (MISCELLANEOUS)
COVER BACK TABLE 60X90IN (DRAPES) ×4 IMPLANT
COVER MAYO STAND STRL (DRAPES) ×4 IMPLANT
COVER SURGICAL LIGHT HANDLE (MISCELLANEOUS) IMPLANT
DRAIN PENROSE 18X1/4 LTX STRL (DRAIN) IMPLANT
DRAPE HALF SHEET 40X57 (DRAPES) ×4 IMPLANT
ELECT COATED BLADE 2.86 ST (ELECTRODE) IMPLANT
ELECT PENCIL ROCKER SW 15FT (MISCELLANEOUS) IMPLANT
ELECT REM PT RETURN 9FT ADLT (ELECTROSURGICAL) ×3
ELECTRODE REM PT RTRN 9FT ADLT (ELECTROSURGICAL) IMPLANT
GAUZE 4X4 16PLY ~~LOC~~+RFID DBL (SPONGE) ×4 IMPLANT
GLOVE BIO SURGEON STRL SZ7.5 (GLOVE) ×4 IMPLANT
GOWN STRL REUS W/ TWL LRG LVL3 (GOWN DISPOSABLE) IMPLANT
GOWN STRL REUS W/TWL LRG LVL3 (GOWN DISPOSABLE) ×3
GUARD TEETH (MISCELLANEOUS) ×4 IMPLANT
KIT PROLARN PLUS GEL W/NDL (Prosthesis and Implant ENT) IMPLANT
KIT TURNOVER KIT B (KITS) ×4 IMPLANT
NDL HYPO 25GX1X1/2 BEV (NEEDLE) IMPLANT
NDL TRANS ORAL INJECTION (NEEDLE) IMPLANT
NEEDLE HYPO 25GX1X1/2 BEV (NEEDLE) ×3 IMPLANT
NEEDLE TRANS ORAL INJECTION (NEEDLE) IMPLANT
NS IRRIG 1000ML POUR BTL (IV SOLUTION) ×4 IMPLANT
PAD ARMBOARD 7.5X6 YLW CONV (MISCELLANEOUS) ×8 IMPLANT
PATTIES SURGICAL .5 X3 (DISPOSABLE) IMPLANT
POSITIONER HEAD DONUT 9IN (MISCELLANEOUS) IMPLANT
SOL ANTI FOG 6CC (MISCELLANEOUS) IMPLANT
SPONGE T-LAP 18X18 ~~LOC~~+RFID (SPONGE) IMPLANT
SUCTION TUBE FRAZIER 8FR DISP (SUCTIONS) IMPLANT
SURGILUBE 2OZ TUBE FLIPTOP (MISCELLANEOUS) IMPLANT
SUT ETHILON 2 0 FS 18 (SUTURE) IMPLANT
SUT ETHILON 5 0 PS 2 18 (SUTURE) IMPLANT
SUT SILK 3 0 REEL (SUTURE) IMPLANT
SUT VIC AB 3-0 SH 18 (SUTURE) IMPLANT
SYR BULB EAR ULCER 3OZ GRN STR (SYRINGE) IMPLANT
SYR CONTROL 10ML LL (SYRINGE) IMPLANT
TOWEL GREEN STERILE FF (TOWEL DISPOSABLE) ×8 IMPLANT
TUBE CONNECTING 12X1/4 (SUCTIONS) ×4 IMPLANT
TUBE SUCTION INST 8IN (ORTHOPEDIC DISPOSABLE SUPPLIES) ×6
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
## 2022-09-12 NOTE — ED Notes (Signed)
Dr. Blackmon at bedside. 

## 2022-09-12 NOTE — Anesthesia Preprocedure Evaluation (Signed)
Anesthesia Evaluation  Patient identified by MRN, date of birth, ID band Patient awake    Reviewed: Allergy & Precautions, NPO status , Patient's Chart, lab work & pertinent test results  Airway Mallampati: II  TM Distance: >3 FB Neck ROM: Full    Dental  (+) Missing, Poor Dentition, Loose, Chipped   Pulmonary Current Smoker and Patient abstained from smoking.    + decreased breath sounds      Cardiovascular negative cardio ROS  Rhythm:Regular Rate:Normal     Neuro/Psych  PSYCHIATRIC DISORDERS  Depression    negative neurological ROS     GI/Hepatic negative GI ROS, Neg liver ROS,,,  Endo/Other  negative endocrine ROS    Renal/GU negative Renal ROS     Musculoskeletal negative musculoskeletal ROS (+)    Abdominal   Peds  Hematology negative hematology ROS (+)   Anesthesia Other Findings   Reproductive/Obstetrics                             Anesthesia Physical Anesthesia Plan  ASA: 3 and emergent  Anesthesia Plan: General   Post-op Pain Management: Tylenol PO (pre-op)*   Induction: Intravenous, Rapid sequence and Cricoid pressure planned  PONV Risk Score and Plan: 2 and Ondansetron, Dexamethasone and Midazolam  Airway Management Planned: Oral ETT and Video Laryngoscope Planned  Additional Equipment: None  Intra-op Plan:   Post-operative Plan: Extubation in OR  Informed Consent: I have reviewed the patients History and Physical, chart, labs and discussed the procedure including the risks, benefits and alternatives for the proposed anesthesia with the patient or authorized representative who has indicated his/her understanding and acceptance.     Dental advisory given  Plan Discussed with: CRNA  Anesthesia Plan Comments:        Anesthesia Quick Evaluation

## 2022-09-12 NOTE — ED Notes (Signed)
Date and time results received: 09/12/22 5:23 PM  (use smartphrase ".now" to insert current time)  Test: lactic acid Critical Value: 3.0   Name of Provider Notified: Dr. Theresia Lo  Orders Received? Or Actions Taken?: see chart

## 2022-09-12 NOTE — Transfer of Care (Signed)
Immediate Anesthesia Transfer of Care Note  Patient: [name] [name]  Procedure(s) Performed: DIRECT LARYNGOSCOPY (Mouth) ESOPHAGOSCOPY (Esophagus) NECK LACERATION REPAIR- bilateral (Bilateral: Neck)  Patient Location: PACU  Anesthesia Type:General  Level of Consciousness: awake  Airway & Oxygen Therapy: Patient Spontanous Breathing  Post-op Assessment: Report given to RN and Post -op Vital signs reviewed and stable  Post vital signs: Reviewed and stable  Last Vitals:  Vitals Value Taken Time  BP    Temp    Pulse    Resp    SpO2      Last Pain:  Vitals:   09/12/22 1628  TempSrc:   PainSc: 2          Complications: No notable events documented.
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
## 2022-09-12 NOTE — Progress Notes (Signed)
Orthopedic Tech Progress Note Patient Details:  Chaska Litterer 1965/09/05 [id number]  Level 1 trauma, not needed at this time  Patient ID: Trendyn Arrellano, male   DOB: 1966/02/14, 57 y.o.   MRN: [id number]  Docia Furl 09/12/2022, 5:35 PM

## 2022-09-12 NOTE — TOC CAGE-AID Note (Signed)
Transition of Care [facility]) - CAGE-AID Screening   Patient Details  Name: [name] [name] MRN: [id number] Date of Birth: November 07, 1965    Hewitt Shorts, RN Trauma Response Nurse Phone Number:  [phone] 09/12/2022, 6:42 PM   Clinical Narrative:  Pt denies any alcohol or drug use.   CAGE-AID Screening:    Have You Ever Felt You Ought to Cut Down on Your Drinking or Drug Use?: No Have People Annoyed You By Critizing Your Drinking Or Drug Use?: No Have You Felt Bad Or Guilty About Your Drinking Or Drug Use?: No Have You Ever Had a Drink or Used Drugs First Thing In The Morning to Steady Your Nerves or to Get Rid of a Hangover?: No CAGE-AID Score: 0  Substance Abuse Education Offered: No

## 2022-09-12 NOTE — ED Notes (Addendum)
Pt taken to CT.
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
## 2022-09-12 NOTE — Anesthesia Procedure Notes (Signed)
Procedure Name: Intubation Date/Time: 09/12/2022 6:22 PM  Performed by: Garfield Cornea, CRNAPre-anesthesia Checklist: Patient identified, Emergency Drugs available, Suction available and Patient being monitored Patient Re-evaluated:Patient Re-evaluated prior to induction Oxygen Delivery Method: Circle System Utilized Preoxygenation: Pre-oxygenation with 100% oxygen Induction Type: IV induction and Rapid sequence Laryngoscope Size: Glidescope and 4 Grade View: Grade I Tube type: Oral Tube size: 7.5 mm Number of attempts: 1 Airway Equipment and Method: Rigid stylet and Video-laryngoscopy Placement Confirmation: ETT inserted through vocal cords under direct vision, positive ETCO2 and breath sounds checked- equal and bilateral Secured at: 22 cm Tube secured with: Tape Dental Injury: Teeth and Oropharynx as per pre-operative assessment

## 2022-09-12 NOTE — ED Notes (Signed)
Dr. Kingsley at bedside 
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
## 2022-09-12 NOTE — OR Nursing (Signed)
Upper central incisor came out. Teeth was in disrepair already

## 2022-09-13 ENCOUNTER — Encounter (HOSPITAL_COMMUNITY): Payer: Self-pay | Admitting: Otolaryngology

## 2022-09-13 DIAGNOSIS — F32A Depression, unspecified: Secondary | ICD-10-CM

## 2022-09-13 LAB — CBC
HCT: 32.7 % — ABNORMAL LOW (ref 39.0–52.0)
Hemoglobin: 11.3 g/dL — ABNORMAL LOW (ref 13.0–17.0)
MCH: 29.7 pg (ref 26.0–34.0)
MCHC: 34.6 g/dL (ref 30.0–36.0)
MCV: 86.1 fL (ref 80.0–100.0)
Platelets: 190 10*3/uL (ref 150–400)
RBC: 3.8 MIL/uL — ABNORMAL LOW (ref 4.22–5.81)
RDW: 12.7 % (ref 11.5–15.5)
WBC: 13.9 10*3/uL — ABNORMAL HIGH (ref 4.0–10.5)
nRBC: 0 % (ref 0.0–0.2)

## 2022-09-13 LAB — TYPE AND SCREEN
ABO/RH(D): A POS
Antibody Screen: NEGATIVE

## 2022-09-13 LAB — BPAM RBC
Blood Product Expiration Date: 202406292359
ISSUE DATE / TIME: 202405251610
Unit Type and Rh: 5100

## 2022-09-13 LAB — HIV ANTIBODY (ROUTINE TESTING W REFLEX): HIV Screen 4th Generation wRfx: NONREACTIVE
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
## 2022-09-14 DIAGNOSIS — F332 Major depressive disorder, recurrent severe without psychotic features: Secondary | ICD-10-CM

## 2022-09-14 MED ORDER — ESCITALOPRAM OXALATE 10 MG PO TABS
5.0000 mg | ORAL_TABLET | Freq: Every day | ORAL | Status: DC
  Administered 2022-09-14: 5 mg via ORAL
  Filled 2022-09-14: qty 1

## 2022-09-14 NOTE — Anesthesia Postprocedure Evaluation (Signed)
Anesthesia Post Note  Patient: Taz Waggle  Procedure(s) Performed: DIRECT LARYNGOSCOPY (Mouth) ESOPHAGOSCOPY (Esophagus) NECK LACERATION REPAIR- bilateral (Bilateral: Neck)     Patient location during evaluation: PACU Anesthesia Type: General Level of consciousness: awake and alert Pain management: pain level controlled Vital Signs Assessment: post-procedure vital signs reviewed and stable Respiratory status: spontaneous breathing, nonlabored ventilation, respiratory function stable and patient connected to nasal cannula oxygen Cardiovascular status: blood pressure returned to baseline and stable Postop Assessment: no apparent nausea or vomiting Anesthetic complications: no   No notable events documented.               Shelton Silvas
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
## 2022-09-15 ENCOUNTER — Inpatient Hospital Stay (HOSPITAL_COMMUNITY)
Admission: AD | Admit: 2022-09-15 | Discharge: 2022-09-21 | DRG: 885 | Disposition: A | Discharge status: Home / Self Care | Payer: Medicaid Other | Source: Intra-hospital | Attending: Psychiatry | Admitting: Psychiatry

## 2022-09-15 ENCOUNTER — Other Ambulatory Visit: Payer: Self-pay

## 2022-09-15 ENCOUNTER — Encounter (HOSPITAL_COMMUNITY): Payer: Self-pay | Admitting: Family

## 2022-09-15 DIAGNOSIS — F17200 Nicotine dependence, unspecified, uncomplicated: Secondary | ICD-10-CM | POA: Diagnosis present

## 2022-09-15 DIAGNOSIS — F1021 Alcohol dependence, in remission: Secondary | ICD-10-CM

## 2022-09-15 DIAGNOSIS — F332 Major depressive disorder, recurrent severe without psychotic features: Principal | ICD-10-CM | POA: Diagnosis present

## 2022-09-15 DIAGNOSIS — F411 Generalized anxiety disorder: Secondary | ICD-10-CM | POA: Insufficient documentation

## 2022-09-15 DIAGNOSIS — K5900 Constipation, unspecified: Secondary | ICD-10-CM | POA: Diagnosis present

## 2022-09-15 DIAGNOSIS — K30 Functional dyspepsia: Secondary | ICD-10-CM | POA: Diagnosis present

## 2022-09-15 DIAGNOSIS — F17210 Nicotine dependence, cigarettes, uncomplicated: Secondary | ICD-10-CM | POA: Diagnosis present

## 2022-09-15 DIAGNOSIS — S1191XA Laceration without foreign body of unspecified part of neck, initial encounter: Secondary | ICD-10-CM | POA: Diagnosis not present

## 2022-09-15 DIAGNOSIS — Z91199 Patient's noncompliance with other medical treatment and regimen due to unspecified reason: Secondary | ICD-10-CM | POA: Diagnosis not present

## 2022-09-15 DIAGNOSIS — T1491XA Suicide attempt, initial encounter: Secondary | ICD-10-CM | POA: Diagnosis present

## 2022-09-15 DIAGNOSIS — Z818 Family history of other mental and behavioral disorders: Secondary | ICD-10-CM | POA: Diagnosis not present

## 2022-09-15 DIAGNOSIS — X781XXA Intentional self-harm by knife, initial encounter: Secondary | ICD-10-CM | POA: Diagnosis present

## 2022-09-15 DIAGNOSIS — Z23 Encounter for immunization: Secondary | ICD-10-CM | POA: Diagnosis not present

## 2022-09-15 DIAGNOSIS — N179 Acute kidney failure, unspecified: Secondary | ICD-10-CM | POA: Diagnosis not present

## 2022-09-15 DIAGNOSIS — T797XXA Traumatic subcutaneous emphysema, initial encounter: Secondary | ICD-10-CM | POA: Diagnosis not present

## 2022-09-15 MED ORDER — POLYETHYLENE GLYCOL 3350 17 G PO PACK: 17.0000 g | PACK | Freq: Every day | ORAL | 0 refills | Status: AC | PRN

## 2022-09-15 MED ORDER — DOCUSATE SODIUM 100 MG PO CAPS: 100.0000 mg | ORAL_CAPSULE | Freq: Two times a day (BID) | ORAL | Status: DC | PRN

## 2022-09-15 MED ORDER — ESCITALOPRAM OXALATE 5 MG PO TABS
5.0000 mg | ORAL_TABLET | Freq: Every day | ORAL | Status: DC
  Filled 2022-09-15 (×4): qty 1

## 2022-09-15 MED ORDER — DIPHENHYDRAMINE HCL 50 MG/ML IJ SOLN: 50.0000 mg | Freq: Three times a day (TID) | INTRAMUSCULAR | Status: DC | PRN

## 2022-09-15 MED ORDER — OXYCODONE HCL 5 MG PO TABS: 5.0000 mg | ORAL_TABLET | ORAL | Status: DC | PRN

## 2022-09-15 MED ORDER — DOCUSATE SODIUM 100 MG PO CAPS: 100.0000 mg | ORAL_CAPSULE | Freq: Two times a day (BID) | ORAL | 0 refills | Status: AC | PRN

## 2022-09-15 MED ORDER — ACETAMINOPHEN 325 MG PO TABS: 650.0000 mg | ORAL_TABLET | Freq: Four times a day (QID) | ORAL | Status: DC | PRN

## 2022-09-15 MED ORDER — ALUM & MAG HYDROXIDE-SIMETH 200-200-20 MG/5ML PO SUSP: 30.0000 mL | ORAL | Status: DC | PRN

## 2022-09-15 MED ORDER — ACETAMINOPHEN 500 MG PO TABS: 1000.0000 mg | ORAL_TABLET | Freq: Four times a day (QID) | ORAL | 0 refills | Status: DC | PRN

## 2022-09-15 MED ORDER — MAGNESIUM HYDROXIDE 400 MG/5ML PO SUSP: 30.0000 mL | Freq: Every day | ORAL | Status: DC | PRN

## 2022-09-15 MED ORDER — ESCITALOPRAM OXALATE 5 MG PO TABS: 5.0000 mg | ORAL_TABLET | Freq: Every day | ORAL | Status: DC

## 2022-09-15 MED ORDER — DIPHENHYDRAMINE HCL 25 MG PO CAPS: 50.0000 mg | ORAL_CAPSULE | Freq: Three times a day (TID) | ORAL | Status: DC | PRN

## 2022-09-15 MED ORDER — METHOCARBAMOL 500 MG PO TABS: 500.0000 mg | ORAL_TABLET | Freq: Three times a day (TID) | ORAL | Status: DC | PRN

## 2022-09-15 NOTE — Progress Notes (Signed)
Report called to Rozell Searing for [facility] admission
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
## 2022-09-15 NOTE — Group Note (Signed)
Date:  09/15/2022 Time:  4:58 PM  Group Topic/Focus:  MHA Group/Peer Support/WRAP    Participation Level:  Did Not Attend  Participation Quality:   n/a  Affect:   n/a  Cognitive:   n/a  Insight: None  Engagement in Group:   n/a  Modes of Intervention:   n/a  Additional Comments:   Pt did not attend.  Edmund Hilda Jayton Popelka 09/15/2022, 4:58 PM

## 2022-09-15 NOTE — Group Note (Signed)
Date:  09/15/2022 Time:  5:06 PM  Group Topic/Focus:  MHA Group/Peer Support/WRAP    Participation Level:  Did Not Attend  Participation Quality:   n/a  Affect:   n/a  Cognitive:   n/a  Insight: None  Engagement in Group:   n/a  Modes of Intervention:   n/a  Additional Comments:   Pt did not attend.  Edmund Hilda Chloey Ricard 09/15/2022, 5:06 PM
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
## 2022-09-15 NOTE — BHH Group Notes (Signed)
Adult Psychoeducational Group Note  Date:  09/15/2022 Time:  10:38 PM  Group Topic/Focus:  Wrap-Up Group:   The focus of this group is to help patients review their daily goal of treatment and discuss progress on daily workbooks.  Participation Level:  Active  Participation Quality:  Appropriate  Affect:  Appropriate  Cognitive:  Appropriate  Insight: Appropriate  Engagement in Group:  Engaged  Modes of Intervention:  Discussion  Additional Comments:  Pt participated in wrap-up group.  [name] [name] 09/15/2022, 10:38 PM 

## 2022-09-15 NOTE — Progress Notes (Signed)
Pt was accepted to [facility] TODAY 09/15/2022, pending EKG and signed voluntary consent faxed to [phone]. Bed assignment: 306-2  Pt meets inpatient criteria per Caryn Bee, NP  Attending Physician will be Phineas Inches, MD  Report can be called to: - Adult unit: [phone]  Pt can arrive after pending items are received; [facility] AC to coordinate arrival time  Care Team Notified: [facility] Rona Ravens, RN, Caryn Bee, NP, Margaretha Seeds, MD, Feliberto Harts, RN, Sidney Ace, RN, Francis Gaines, RN, and Sherryl Manges, RN  [place], LCSW  09/15/2022 12:43 PM

## 2022-09-15 NOTE — Group Note (Signed)
Date:  09/15/2022 Time:  4:40 PM  Group Topic/Focus:  Goals Group:   The focus of this group is to help patients establish daily goals to achieve during treatment and discuss how the patient can incorporate goal setting into their daily lives to aide in recovery. Orientation:   The focus of this group is to educate the patient on the purpose and policies of crisis stabilization and provide a format to answer questions about their admission.  The group details unit policies and expectations of patients while admitted.    Participation Level:  Did Not Attend  Participation Quality:   n/a  Affect:   n/a  Cognitive:   n/a  Insight: None  Engagement in Group:   n/a  Modes of Intervention:   n/a  Additional Comments:   Pt did not attend.  Aundrea Higginbotham M Damion Kant 09/15/2022, 4:40 PM  
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
## 2022-09-16 DIAGNOSIS — F1021 Alcohol dependence, in remission: Secondary | ICD-10-CM

## 2022-09-16 DIAGNOSIS — F17200 Nicotine dependence, unspecified, uncomplicated: Secondary | ICD-10-CM | POA: Diagnosis present

## 2022-09-16 DIAGNOSIS — F411 Generalized anxiety disorder: Secondary | ICD-10-CM | POA: Insufficient documentation

## 2022-09-16 DIAGNOSIS — F332 Major depressive disorder, recurrent severe without psychotic features: Principal | ICD-10-CM

## 2022-09-16 MED ORDER — TRAZODONE HCL 50 MG PO TABS
50.0000 mg | ORAL_TABLET | Freq: Every evening | ORAL | Status: DC | PRN
  Administered 2022-09-18 – 2022-09-20 (×3): 50 mg via ORAL
  Filled 2022-09-16 (×4): qty 1

## 2022-09-16 MED ORDER — ESCITALOPRAM OXALATE 5 MG PO TABS
5.0000 mg | ORAL_TABLET | Freq: Every day | ORAL | Status: DC
  Administered 2022-09-16 – 2022-09-17 (×2): 5 mg via ORAL
  Filled 2022-09-16 (×4): qty 1

## 2022-09-16 MED ORDER — HYDROXYZINE HCL 25 MG PO TABS
25.0000 mg | ORAL_TABLET | Freq: Three times a day (TID) | ORAL | Status: DC | PRN
  Administered 2022-09-16 – 2022-09-18 (×2): 25 mg via ORAL
  Filled 2022-09-16 (×3): qty 1

## 2022-09-16 MED ORDER — OXYCODONE HCL 5 MG PO TABS: 5.0000 mg | ORAL_TABLET | Freq: Four times a day (QID) | ORAL | Status: DC | PRN

## 2022-09-16 NOTE — Progress Notes (Addendum)
   09/16/22 2125  Psych Admission Type (Psych Patients Only)  Admission Status Voluntary  Psychosocial Assessment  Patient Complaints Anxiety  Eye Contact Brief  Facial Expression Flat  Affect Depressed  Speech Logical/coherent  Interaction Assertive  Motor Activity Slow  Appearance/Hygiene Unremarkable  Behavior Characteristics Cooperative;Appropriate to situation  Mood Pleasant;Depressed;Sad  Thought Process  Coherency WDL  Content Blaming others  Delusions None reported or observed  Perception WDL  Hallucination None reported or observed  Judgment Poor  Confusion None  Danger to Self  Current suicidal ideation? Denies  Danger to Others  Danger to Others None reported or observed    On assessment, patient presents with moderate anxiety and denies pain.  Patient appears depressed and sad while reporting this as his first hospitalization.  Patient denies SI/HI/AVH and contracts for safety.  Administered PRN Hydroxyzine per MAR per patient request.  Patient is safe on the unit with q 15 minute safety checks.
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
## 2022-09-16 NOTE — Progress Notes (Signed)
   09/16/22 0000  Psychosocial Assessment  Patient Complaints Hopelessness;Worrying  Eye Contact Brief  Facial Expression Flat  Affect Depressed  Speech Logical/coherent  Interaction Attention-seeking  Motor Activity Slow  Appearance/Hygiene Unremarkable  Behavior Characteristics Guarded  Mood Depressed  Thought Process  Coherency WDL  Content Blaming others  Delusions None reported or observed  Perception WDL  Hallucination None reported or observed  Judgment Impaired  Confusion None  Danger to Self  Current suicidal ideation? Denies  Danger to Others  Danger to Others None reported or observed   Patient is flat and guarded denies SI/HI/A/VH and verbally contracts for safety visible in Milieu. Support and encouragement provided.

## 2022-09-16 NOTE — Group Note (Signed)
Date:  09/16/2022 Time:  10:54 AM  Group Topic/Focus:  Goals Group:   The focus of this group is to help patients establish daily goals to achieve during treatment and discuss how the patient can incorporate goal setting into their daily lives to aide in recovery. Orientation:   The focus of this group is to educate the patient on the purpose and policies of crisis stabilization and provide a format to answer questions about their admission.  The group details unit policies and expectations of patients while admitted.    Participation Level:  Active  Participation Quality:  Appropriate  Affect:  Appropriate  Cognitive:  Appropriate  Insight: Appropriate  Engagement in Group:  Engaged  Modes of Intervention:  Discussion  Additional Comments:  Patient attended group and was attentive the duration of it. Patient's goal was to attend all groups.   Aubreigh Fuerte T Lorraine Lax 09/16/2022, 10:54 AM

## 2022-09-16 NOTE — Progress Notes (Signed)
Patient appears depressed. Patient denies SI/HI/AVH. Pt reports anxiety is 2/10 and depression is 2/10. Pt reports good sleep and appetite. Patient has bilateral lacerations on anterior of neck. Pt has 4-5 stitches on 2 cuts. Lacerations are under pt beard. Patient remains safe on Q67min checks and contracts for safety.       09/16/22 0857  Psych Admission Type (Psych Patients Only)  Admission Status Voluntary  Psychosocial Assessment  Patient Complaints Anxiety  Eye Contact Brief  Facial Expression Flat  Affect Depressed  Speech Logical/coherent  Interaction Assertive  Motor Activity Slow  Appearance/Hygiene Unremarkable  Behavior Characteristics Guarded  Mood Depressed  Thought Process  Coherency WDL  Content Blaming others  Delusions None reported or observed  Perception WDL  Hallucination None reported or observed  Judgment Impaired  Confusion None  Danger to Self  Current suicidal ideation? Denies  Danger to Others  Danger to Others None reported or observed

## 2022-09-16 NOTE — [facility] Group Notes (Signed)
Date:  09/16/2022 Time:  11:54 AM  Group Topic/Focus:  Wellness Toolbox:   The focus of this group is to discuss various aspects of wellness, balancing those aspects and exploring ways to increase the ability to experience wellness.  Patients will create a wellness toolbox for use upon discharge.  Participation Level:  Active  Participation Quality:  Attentive  Affect:  Appropriate  Cognitive:  Appropriate  Insight: Appropriate  Engagement in Group:  Engaged  Modes of Intervention:  Exploration  Additional Comments:  Pt attended group. Pt was able to reflect on activity and add new coping skill to welllness toolbox.   Nezar Buckles S Angelize Ryce 09/16/2022, 11:54 AM 

## 2022-09-16 NOTE — Plan of Care (Signed)
  Problem: Education: Goal: Utilization of techniques to improve thought processes will improve Outcome: Progressing Goal: Knowledge of the prescribed therapeutic regimen will improve Outcome: Progressing   Problem: Activity: Goal: Interest or engagement in leisure activities will improve Outcome: Progressing Goal: Imbalance in normal sleep/wake cycle will improve Outcome: Progressing   Problem: Coping: Goal: Coping ability will improve Outcome: Progressing Goal: Will verbalize feelings Outcome: Progressing   Problem: Health Behavior/Discharge Planning: Goal: Ability to make decisions will improve Outcome: Progressing Goal: Compliance with therapeutic regimen will improve Outcome: Progressing   Problem: Role Relationship: Goal: Will demonstrate positive changes in social behaviors and relationships Outcome: Progressing   Problem: Safety: Goal: Ability to disclose and discuss suicidal ideas will improve Outcome: Progressing Goal: Ability to identify and utilize support systems that promote safety will improve Outcome: Progressing   Problem: Self-Concept: Goal: Will verbalize positive feelings about self Outcome: Progressing Goal: Level of anxiety will decrease Outcome: Progressing   Problem: Education: Goal: Ability to make informed decisions regarding treatment will improve Outcome: Progressing   Problem: Coping: Goal: Coping ability will improve Outcome: Progressing   Problem: Health Behavior/Discharge Planning: Goal: Identification of resources available to assist in meeting health care needs will improve Outcome: Progressing   Problem: Medication: Goal: Compliance with prescribed medication regimen will improve Outcome: Progressing   Problem: Self-Concept: Goal: Ability to disclose and discuss suicidal ideas will improve Outcome: Progressing Goal: Will verbalize positive feelings about self Outcome: Progressing   Problem: Education: Goal: Knowledge of  General Education information will improve Description: Including pain rating scale, medication(s)/side effects and non-pharmacologic comfort measures Outcome: Progressing   Problem: Health Behavior/Discharge Planning: Goal: Ability to manage health-related needs will improve Outcome: Progressing   Problem: Clinical Measurements: Goal: Ability to maintain clinical measurements within normal limits will improve Outcome: Progressing Goal: Will remain free from infection Outcome: Progressing Goal: Diagnostic test results will improve Outcome: Progressing Goal: Respiratory complications will improve Outcome: Progressing Goal: Cardiovascular complication will be avoided Outcome: Progressing   Problem: Activity: Goal: Risk for activity intolerance will decrease Outcome: Progressing   Problem: Nutrition: Goal: Adequate nutrition will be maintained Outcome: Progressing   Problem: Coping: Goal: Level of anxiety will decrease Outcome: Progressing   Problem: Elimination: Goal: Will not experience complications related to bowel motility Outcome: Progressing Goal: Will not experience complications related to urinary retention Outcome: Progressing   Problem: Pain Managment: Goal: General experience of comfort will improve Outcome: Progressing   Problem: Safety: Goal: Ability to remain free from injury will improve Outcome: Progressing   Problem: Skin Integrity: Goal: Risk for impaired skin integrity will decrease Outcome: Progressing

## 2022-09-16 NOTE — Progress Notes (Signed)
The patient attended the evening [employer] speaker's meeting and was appropriate.  
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
## 2022-09-16 NOTE — Plan of Care (Signed)
  Problem: Coping: Goal: Coping ability will improve Outcome: Progressing Goal: Will verbalize feelings Outcome: Progressing   

## 2022-09-16 NOTE — Group Note (Signed)
Recreation Therapy Group Note   Group Topic:Communication  Group Date: 09/16/2022 Start Time: 0930 End Time: 1000 Facilitators: Blayke Cordrey-McCall, LRT,CTRS Location: [address] Dayroom   Goal Area(s) Addresses:  Patient will effectively listen to complete activity.  Patient will identify communication skills used to make activity successful.  Patient will identify how skills used during activity can be used to reach post d/c goals.    Group Description:  Geometric Drawings.  Three volunteers from the peer group will be shown an abstract picture with a particular arrangement of geometrical shapes.  Each round, one 'speaker' will describe the pattern, as accurately as possible without revealing the image to the group.  The remaining group members will listen and draw the picture to reflect how it is described to them. Patients with the role of 'listener' cannot ask clarifying questions but, may request that the speaker repeat a direction. Once the drawings are complete, the presenter will show the rest of the group the picture and compare how close each person came to drawing the picture. LRT will facilitate a post-activity discussion regarding effective communication and the importance of planning, listening, and asking for clarification in daily interactions with others.   Affect/Mood: Appropriate   Participation Level: Engaged   Participation Quality: Independent   Behavior: Appropriate   Speech/Thought Process: Focused   Insight: Good   Judgement: Good   Modes of Intervention: Activity   Patient Response to Interventions:  Engaged   Education Outcome:  Acknowledges education   Clinical Observations/Individualized Feedback: Pt attended and participated in group session.     Plan: Continue to engage patient in RT group sessions 2-3x/week.   Roderica Cathell-McCall, LRT,CTRS 09/16/2022 12:06 PM

## 2022-09-17 LAB — COMPREHENSIVE METABOLIC PANEL
ALT: 62 U/L — ABNORMAL HIGH (ref 0–44)
AST: 50 U/L — ABNORMAL HIGH (ref 15–41)
Albumin: 3.4 g/dL — ABNORMAL LOW (ref 3.5–5.0)
Alkaline Phosphatase: 53 U/L (ref 38–126)
Anion gap: 7 (ref 5–15)
BUN: 15 mg/dL (ref 6–20)
CO2: 23 mmol/L (ref 22–32)
Calcium: 8.5 mg/dL — ABNORMAL LOW (ref 8.9–10.3)
Chloride: 106 mmol/L (ref 98–111)
Creatinine, Ser: 0.97 mg/dL (ref 0.61–1.24)
GFR, Estimated: >60 mL/min (ref >60)
Glucose, Bld: 106 mg/dL — ABNORMAL HIGH (ref 70–99)
Potassium: 4.2 mmol/L (ref 3.5–5.1)
Sodium: 136 mmol/L (ref 135–145)
Total Bilirubin: 0.4 mg/dL (ref 0.3–1.2)
Total Protein: 6.5 g/dL (ref 6.5–8.1)

## 2022-09-17 LAB — CBC WITH DIFFERENTIAL/PLATELET
Abs Immature Granulocytes: 0.03 10*3/uL (ref 0.00–0.07)
Basophils Absolute: 0 10*3/uL (ref 0.0–0.1)
Basophils Relative: 0 %
Eosinophils Absolute: 0.3 10*3/uL (ref 0.0–0.5)
Eosinophils Relative: 3 %
HCT: 34 % — ABNORMAL LOW (ref 39.0–52.0)
Hemoglobin: 11.3 g/dL — ABNORMAL LOW (ref 13.0–17.0)
Immature Granulocytes: 0 %
Lymphocytes Relative: 28 %
Lymphs Abs: 2.5 10*3/uL (ref 0.7–4.0)
MCH: 29.9 pg (ref 26.0–34.0)
MCHC: 33.2 g/dL (ref 30.0–36.0)
MCV: 89.9 fL (ref 80.0–100.0)
Monocytes Absolute: 0.4 10*3/uL (ref 0.1–1.0)
Monocytes Relative: 4 %
Neutro Abs: 5.7 10*3/uL (ref 1.7–7.7)
Neutrophils Relative %: 65 %
Platelets: 257 10*3/uL (ref 150–400)
RBC: 3.78 MIL/uL — ABNORMAL LOW (ref 4.22–5.81)
RDW: 12.8 % (ref 11.5–15.5)
WBC: 8.9 10*3/uL (ref 4.0–10.5)
nRBC: 0 % (ref 0.0–0.2)

## 2022-09-17 LAB — LIPID PANEL
Cholesterol: 136 mg/dL (ref 0–200)
HDL: 44 mg/dL (ref >40)
LDL Cholesterol: 69 mg/dL (ref 0–99)
Total CHOL/HDL Ratio: 3.1 RATIO
Triglycerides: 113 mg/dL (ref <150)
VLDL: 23 mg/dL (ref 0–40)

## 2022-09-17 NOTE — Group Note (Signed)
Date:  09/17/2022 Time:  12:37 PM  Group Topic/Focus:  Identifying Needs:   The focus of this group is to help patients identify their personal needs that have been historically problematic and identify healthy behaviors to address their needs. Managing Feelings:   The focus of this group is to identify what feelings patients have difficulty handling and develop a plan to handle them in a healthier way upon discharge. Overcoming Stress:   The focus of this group is to define stress and help patients assess their triggers.    Participation Level:  Active  Participation Quality:  Appropriate  Affect:  Appropriate  Cognitive:  Appropriate  Insight: Appropriate  Engagement in Group:  Engaged  Modes of Intervention:  Clarification, Discussion, Exploration, and Support  Additional Comments:    [name] 09/17/2022, 12:37 PM

## 2022-09-17 NOTE — Progress Notes (Signed)
   09/17/22 2316  Psych Admission Type (Psych Patients Only)  Admission Status Voluntary  Psychosocial Assessment  Patient Complaints None  Eye Contact Fair  Facial Expression Animated  Affect Appropriate to circumstance  Speech Logical/coherent  Interaction Assertive  Motor Activity Other (Comment) (wdl)  Appearance/Hygiene Unremarkable  Behavior Characteristics Cooperative  Mood Depressed  Thought Process  Coherency WDL  Content WDL  Delusions None reported or observed  Perception WDL  Hallucination None reported or observed  Judgment Poor  Confusion None  Danger to Self  Current suicidal ideation? Denies  Self-Injurious Behavior No self-injurious ideation or behavior indicators observed or expressed   Agreement Not to Harm Self Yes  Description of Agreement verbal contract  Danger to Others  Danger to Others None reported or observed

## 2022-09-17 NOTE — Progress Notes (Signed)
[facility] MD Progress Note  09/17/2022 7:03 PM [name] [name]  MRN:  [id number]  Reason for admission: 57 yo CM who was taken to the [facility] via EMS on 5/25 after a suicidal attempt by cutting his throat using a butcher knife in the context of worsening depressive symptoms and socioeconomic stressors. Pt was treated, medically cleared, prior to being transferred voluntarily to this [facility] for treatment and stabilization of his mental status.   Daily notes: [name] [name] is seen. Chart reviewed. The chart findings discussed with the treatment team. He presents alert, oriented & aware of situation. He is visible on the unit, attending group sessions. He reports, "I'm here because I attempted suicide & cut my throat. I received stitches for it. What happened was, I was on medications for my mental health. I stopped them on my own 6 years ago. Since I stopped those medicines, my mental health has been gradually declining till it get to this point. I have been doing well coming in here. I'm feeling a lot better. I feel hopeful & optimistic. Being around all these people here has been positive for me. I get to talk to them & socialize with them. I slept okay last night. My anxiety today is #2 & depression #2". [name] currently denies any SIHI, AVH, delusional thoughts or paranoia. He does not appear to be responding to any internal stimuli. There are no changes made on the current plan of care. Will continue as already in progress. He denies any side effects from his medications. Reviewed vital signs, stable. Self-inflicted lacerations with stitches to his neck area remains intact. There are no signs of infection noted. Patient denies any pain during this evaluation.  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  Total Time spent with patient:  35 minutes  Past Psychiatric History: See H&P  Past Medical History:  Past Medical History:  Diagnosis Date   Depression     Past Surgical History:  Procedure Laterality Date   DIRECT LARYNGOSCOPY N/A 09/12/2022   Procedure: DIRECT LARYNGOSCOPY;  Surgeon: Christia Reading, MD;  Location: [facility] OR;  Service: ENT;  Laterality: N/A;   ESOPHAGOSCOPY N/A 09/12/2022   Procedure: ESOPHAGOSCOPY;  Surgeon: Christia Reading, MD;  Location: [facility] OR;  Service: ENT;  Laterality: N/A;   FACIAL LACERATION REPAIR Bilateral 09/12/2022   Procedure: NECK LACERATION REPAIR- bilateral;  Surgeon: Christia Reading, MD;  Location: [facility] OR;  Service: ENT;  Laterality: Bilateral;   INCISION AND DRAINAGE FOOT Right    skin grafts Right    Foot   Family History: History reviewed. No pertinent family history.  Family Psychiatric  History: See H&P.  Social History:  Social History   Substance and Sexual Activity  Alcohol Use Not Currently     Social History   Substance and Sexual Activity  Drug Use Not Currently    Social History   Socioeconomic History   Marital status: Single    Spouse name: Not on file   Number of children: Not on file   Years of education: Not on file   Highest education level: Not on file  Occupational History   Not on file  Tobacco Use   Smoking status: Every Day    Packs/day: 0.25  Years: 35.00    Additional pack years: 0.00    Total pack years: 8.75    Types: Cigarettes   Smokeless tobacco: Never  Vaping Use   Vaping Use: Former  Substance and Sexual Activity   Alcohol use: Not Currently   Drug use: Not Currently   Sexual activity: Not Currently  Other Topics Concern   Not on file  Social History Narrative   Not on file   Social Determinants of Health   Financial Resource Strain: Not on file  Food Insecurity: No Food Insecurity  (09/15/2022)   Hunger Vital Sign    Worried About Running Out of Food in the Last Year: Never true    Ran Out of Food in the Last Year: Never true  Transportation Needs: No Transportation Needs (09/15/2022)   PRAPARE - Administrator, Civil Service (Medical): No    Lack of Transportation (Non-Medical): No  Physical Activity: Not on file  Stress: Not on file  Social Connections: Not on file   Additional Social History:   Sleep: Good  Appetite:  Good  Current Medications: Current Facility-Administered Medications  Medication Dose Route Frequency Provider Last Rate Last Admin   acetaminophen (TYLENOL) tablet 650 mg  650 mg Oral Q6H PRN Starkes-Perry, Juel Burrow, FNP       alum & mag hydroxide-simeth (MAALOX/MYLANTA) 200-200-20 MG/5ML suspension 30 mL  30 mL Oral Q4H PRN Starkes-Perry, Juel Burrow, FNP       diphenhydrAMINE (BENADRYL) capsule 50 mg  50 mg Oral TID PRN Maryagnes Amos, FNP       Or   diphenhydrAMINE (BENADRYL) injection 50 mg  50 mg Intramuscular TID PRN Rosario Adie, Juel Burrow, FNP       docusate sodium (COLACE) capsule 100 mg  100 mg Oral BID PRN Maryagnes Amos, FNP       escitalopram (LEXAPRO) tablet 5 mg  5 mg Oral Daily Massengill, Nathan, MD   5 mg at 09/17/22 0901   hydrOXYzine (ATARAX) tablet 25 mg  25 mg Oral TID PRN Phineas Inches, MD   25 mg at 09/16/22 2125   magnesium hydroxide (MILK OF MAGNESIA) suspension 30 mL  30 mL Oral Daily PRN Maryagnes Amos, FNP       oxyCODONE (Oxy IR/ROXICODONE) immediate release tablet 5 mg  5 mg Oral Q6H PRN Starleen Blue, NP       traZODone (DESYREL) tablet 50 mg  50 mg Oral QHS PRN Massengill, Harrold Donath, MD       Lab Results:  Results for orders placed or performed during the hospital encounter of 09/15/22 (from the past 48 hour(s))  CBC with Differential/Platelet     Status: Abnormal   Collection Time: 09/17/22  6:31 AM  Result Value Ref Range   WBC 8.9 4.0 - 10.5 K/uL   RBC 3.78 (L) 4.22 - 5.81  MIL/uL   Hemoglobin 11.3 (L) 13.0 - 17.0 g/dL   HCT 40.9 (L) 81.1 - 91.4 %   MCV 89.9 80.0 - 100.0 fL   MCH 29.9 26.0 - 34.0 pg   MCHC 33.2 30.0 - 36.0 g/dL   RDW 78.2 95.6 - 21.3 %   Platelets 257 150 - 400 K/uL   nRBC 0.0 0.0 - 0.2 %   Neutrophils Relative % 65 %   Neutro Abs 5.7 1.7 - 7.7 K/uL   Lymphocytes Relative 28 %   Lymphs Abs 2.5 0.7 - 4.0 K/uL   Monocytes Relative 4 %   Monocytes Absolute  0.4 0.1 - 1.0 K/uL   Eosinophils Relative 3 %   Eosinophils Absolute 0.3 0.0 - 0.5 K/uL   Basophils Relative 0 %   Basophils Absolute 0.0 0.0 - 0.1 K/uL   Immature Granulocytes 0 %   Abs Immature Granulocytes 0.03 0.00 - 0.07 K/uL    Comment: Performed at [facility], [address], [place], [place] [address]  Lipid panel     Status: None   Collection Time: 09/17/22  6:31 AM  Result Value Ref Range   Cholesterol 136 0 - 200 mg/dL   Triglycerides 604 <540 mg/dL   HDL 44 >98 mg/dL   Total CHOL/HDL Ratio 3.1 RATIO   VLDL 23 0 - 40 mg/dL   LDL Cholesterol 69 0 - 99 mg/dL    Comment:        Total Cholesterol/HDL:CHD Risk Coronary Heart Disease Risk Table                     Men   Women  1/2 Average Risk   3.4   3.3  Average Risk       5.0   4.4  2 X Average Risk   9.6   7.1  3 X Average Risk  23.4   11.0        Use the calculated Patient Ratio above and the CHD Risk Table to determine the patient's CHD Risk.        ATP III CLASSIFICATION (LDL):  <100     mg/dL   Optimal  119-147  mg/dL   Near or Above                    Optimal  130-159  mg/dL   Borderline  829-562  mg/dL   High  >130     mg/dL   Very High Performed at [facility], [address] [place], [place] [address]   Comprehensive metabolic panel     Status: Abnormal   Collection Time: 09/17/22  6:31 AM  Result Value Ref Range   Sodium 136 135 - 145 mmol/L   Potassium 4.2 3.5 - 5.1 mmol/L   Chloride 106 98 - 111 mmol/L   CO2 23 22 - 32 mmol/L   Glucose, Bld 106 (H)  70 - 99 mg/dL    Comment: Glucose reference range applies only to samples taken after fasting for at least 8 hours.   BUN 15 6 - 20 mg/dL   Creatinine, Ser 4.69 0.61 - 1.24 mg/dL   Calcium 8.5 (L) 8.9 - 10.3 mg/dL   Total Protein 6.5 6.5 - 8.1 g/dL   Albumin 3.4 (L) 3.5 - 5.0 g/dL   AST 50 (H) 15 - 41 U/L   ALT 62 (H) 0 - 44 U/L   Alkaline Phosphatase 53 38 - 126 U/L   Total Bilirubin 0.4 0.3 - 1.2 mg/dL   GFR, Estimated >62 >95 mL/min    Comment: (NOTE) Calculated using the CKD-EPI Creatinine Equation (2021)    Anion gap 7 5 - 15    Comment: Performed at [facility], [address] [place], [place] [address]   Blood Alcohol level:  Lab Results  Component Value Date   ETH <10 09/12/2022   Metabolic Disorder Labs: No results found for: "HGBA1C", "MPG" No results found for: "PROLACTIN" Lab Results  Component Value Date   CHOL 136 09/17/2022   TRIG 113 09/17/2022   HDL 44 09/17/2022  CHOLHDL 3.1 09/17/2022   VLDL 23 09/17/2022   LDLCALC 69 09/17/2022    Physical Findings: AIMS:  , ,  ,  ,    CIWA:    COWS:     Musculoskeletal: Strength & Muscle Tone: within normal limits Gait & Station: normal Patient leans: N/A  Psychiatric Specialty Exam:  Presentation  General Appearance:  Disheveled  Eye Contact: Good  Speech: Clear and Coherent  Speech Volume: Normal  Handedness: Right   Mood and Affect  Mood: Depressed; Anxious  Affect: Congruent   Thought Process  Thought Processes: Coherent  Descriptions of Associations:Intact  Orientation:Full (Time, Place and Person)  Thought Content:Logical  History of Schizophrenia/Schizoaffective disorder:No data recorded Duration of Psychotic Symptoms:No data recorded Hallucinations:Hallucinations: None  Ideas of Reference:None  Suicidal Thoughts:Suicidal Thoughts: No  Homicidal Thoughts:Homicidal Thoughts: No   Sensorium  Memory: Immediate  Good  Judgment: Fair  Insight: Fair  Art therapist  Concentration: Good  Attention Span: Good  Recall: Good  Fund of Knowledge: Good  Language: Good  Psychomotor Activity  Psychomotor Activity: Psychomotor Activity: Normal  Assets  Assets: Communication Skills; Housing; Social Support  Sleep  Sleep: Sleep: Fair  Physical Exam: Physical Exam Vitals and nursing note reviewed.  Cardiovascular:     Rate and Rhythm: Normal rate.     Comments: Elevated pulse rate: 114 Pulmonary:     Effort: Pulmonary effort is normal.  Genitourinary:    Comments: Deferred Musculoskeletal:        General: Normal range of motion.  Skin:    General: Skin is warm.     Comments: Self inflicted laceration to throat area with stiches intact. There are drainage or redness noted.  Neurological:     General: No focal deficit present.     Mental Status: He is oriented to person, place, and time.    Review of Systems  Constitutional:  Negative for chills, diaphoresis and fever.  HENT:  Negative for congestion and sore throat.   Respiratory:  Negative for cough, shortness of breath and wheezing.   Cardiovascular:  Negative for chest pain and palpitations.  Gastrointestinal:  Negative for abdominal pain, constipation, diarrhea, heartburn, nausea and vomiting.  Musculoskeletal:  Negative for joint pain and myalgias.  Neurological:  Negative for dizziness, tingling, tremors, sensory change, speech change, focal weakness, seizures, loss of consciousness, weakness and headaches.  Psychiatric/Behavioral:  Positive for depression. Negative for hallucinations, memory loss and suicidal ideas. The patient is not nervous/anxious and does not have insomnia.    Blood pressure 129/84, pulse 67, temperature 98.4 F (36.9 C), temperature source Oral, resp. rate 18, height 5\' 8"  (1.727 m), weight 81.8 kg, SpO2 100 %. Body mass index is 27.43 kg/m.  Treatment Plan Summary: Daily contact with  patient to assess and evaluate symptoms and progress in treatment and Medication management.   Continue inpatient hospitalization.  Will continue today 09/17/2022 plan as below except where it is noted.   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 -Continue trazodone 50 mg nightly as needed for sleep   Other PRNS -Continue Tylenol 650 mg every 6 hours  PRN for mild pain -Continue Maalox 30 mg every 4 hrs PRN for indigestion -Continue Milk of Magnesia as needed every 6 hrs for constipation   Patient educated on the rationales, benefits, and possible side effects of all medications, verbalizes understanding, agreeable to trials.    Bilateral neck area with sutures in place, well approximated, no drainage, edema, or redness noted to area.  Area clean, and dry and intact.  6 sutures to left side of neck, and 4 present on the right.  Patient states there is no discomfort to the area.  Administered tetanus shot while at the medical Hospital.   Discharge Planning: Social work and case management to assist with discharge planning and identification of hospital follow-up needs prior to discharge Estimated LOS: 5-7 days Discharge Concerns: Need to establish a safety plan; Medication compliance and effectiveness Discharge Goals: Return home with outpatient referrals for mental health follow-up including medication management/psychotherapy  Armandina Stammer, NP, pmhnp, fnp-bc 09/17/2022, 7:03 PM

## 2022-09-17 NOTE — Group Note (Signed)
Date:  09/17/2022 Time:  10:23 AM  Group Topic/Focus:  Goals Group:   The focus of this group is to help patients establish daily goals to achieve during treatment and discuss how the patient can incorporate goal setting into their daily lives to aide in recovery. Orientation:   The focus of this group is to educate the patient on the purpose and policies of crisis stabilization and provide a format to answer questions about their admission.  The group details unit policies and expectations of patients while admitted.    Participation Level:  Active  Participation Quality:  Attentive  Affect:  Appropriate  Cognitive:  Appropriate  Insight: Appropriate  Engagement in Group:  Engaged  Modes of Intervention:  Discussion  Additional Comments:  Patient attended group and was attentive the duration of it. Patient's goal was to come up with a discharge plan.    Zain Bingman T Lorraine Lax 09/17/2022, 10:23 AM

## 2022-09-17 NOTE — Progress Notes (Signed)
Urine placed in fridge for pickup

## 2022-09-17 NOTE — Progress Notes (Signed)
   09/17/22 0548  15 Minute Checks  Location Bedroom  Visual Appearance Calm  Behavior Sleeping  Sleep (Behavioral Health Patients Only)  Calculate sleep? (Click Yes once per 24 hr at 0600 safety check) Yes  Documented sleep last 24 hours 6.5

## 2022-09-17 NOTE — Progress Notes (Signed)
   09/17/22 0901  Psych Admission Type (Psych Patients Only)  Admission Status Voluntary  Psychosocial Assessment  Patient Complaints None  Eye Contact Fair  Facial Expression Animated  Affect Appropriate to circumstance  Speech Logical/coherent  Interaction Assertive  Motor Activity Other (Comment) (WDL)  Appearance/Hygiene Unremarkable  Behavior Characteristics Cooperative;Appropriate to situation  Thought Process  Coherency WDL  Content WDL  Delusions None reported or observed  Perception WDL  Hallucination None reported or observed  Judgment Poor  Confusion None  Danger to Self  Current suicidal ideation? Denies  Danger to Others  Danger to Others None reported or observed

## 2022-09-17 NOTE — [facility] Group Notes (Signed)
[facility] Group Notes:  (Nursing/MHT/Case Management/Adjunct)  Date:  09/17/2022  Time:  2000  Type of Therapy:   wrap up group  Participation Level:  Active  Participation Quality:  Appropriate, Attentive, Sharing, and Supportive  Affect:  Depressed and Flat  Cognitive:  Alert  Insight:  Improving  Engagement in Group:  Engaged  Modes of Intervention:  Clarification, Education, and Support  Summary of Progress/Problems:Positive thinking and positive change were discussed.   Marcille Buffy 09/17/2022, 9:37 PM

## 2022-09-17 NOTE — Progress Notes (Signed)
Report given to Doug RN

## 2022-09-18 LAB — URINALYSIS, ROUTINE W REFLEX MICROSCOPIC
Bilirubin Urine: NEGATIVE
Glucose, UA: 150 mg/dL — AB
Ketones, ur: NEGATIVE mg/dL
Leukocytes,Ua: NEGATIVE
Nitrite: NEGATIVE
Protein, ur: NEGATIVE mg/dL
Specific Gravity, Urine: 1.025 (ref 1.005–1.030)
pH: 5 (ref 5.0–8.0)

## 2022-09-18 LAB — MISC LABCORP TEST (SEND OUT): Labcorp test code: 81950

## 2022-09-18 LAB — HEMOGLOBIN A1C
Hgb A1c MFr Bld: 5.6 % (ref 4.8–5.6)
Mean Plasma Glucose: 114 mg/dL

## 2022-09-18 MED ORDER — ESCITALOPRAM OXALATE 10 MG PO TABS
10.0000 mg | ORAL_TABLET | Freq: Every day | ORAL | Status: DC
  Administered 2022-09-18 – 2022-09-21 (×4): 10 mg via ORAL
  Filled 2022-09-18 (×6): qty 1

## 2022-09-18 NOTE — [facility] Group Notes (Signed)
[facility] Group Notes:  (Nursing/MHT/Case Management/Adjunct)  Date:  09/18/2022  Time:  8:21 PM  Type of Therapy:   AA group  Participation Level:  Active  Participation Quality:  Appropriate  Affect:  Appropriate  Cognitive:  Appropriate  Insight:  Appropriate  Engagement in Group:  Engaged  Modes of Intervention:  Education  Summary of Progress/Problems: Attended AA meeting.  [name] [name] 09/18/2022, 8:21 PM

## 2022-09-18 NOTE — Progress Notes (Signed)
   09/18/22 2300  Psych Admission Type (Psych Patients Only)  Admission Status Voluntary  Psychosocial Assessment  Patient Complaints None  Eye Contact Fair  Facial Expression Masked  Affect Appropriate to circumstance  Speech Logical/coherent  Interaction Assertive  Motor Activity Slow  Appearance/Hygiene Unremarkable  Behavior Characteristics Cooperative  Mood Pleasant  Thought Process  Coherency WDL  Content WDL  Delusions None reported or observed  Perception WDL  Hallucination None reported or observed  Judgment Poor  Confusion None  Danger to Self  Current suicidal ideation? Denies (Denies)  Self-Injurious Behavior No self-injurious ideation or behavior indicators observed or expressed  (none)  Agreement Not to Harm Self Yes  Description of Agreement verbal  Danger to Others  Danger to Others None reported or observed   Denies SI/HI.AVH Was calm and cooperative and will continue to monitor

## 2022-09-18 NOTE — Progress Notes (Addendum)
[facility] MD Progress Note  09/18/2022 3:16 PM [name] [name]  MRN:  [id number]  Reason for admission: 57 yo CM who was taken to the [facility] via EMS on 5/25 after a suicidal attempt by cutting his throat using a butcher knife in the context of worsening depressive symptoms and socioeconomic stressors. Pt was treated, medically cleared, prior to being transferred voluntarily to this [facility] for treatment and stabilization of his mental status.   Yesterday the psychiatry team made the following recommendations: Increase Lexapro to 10 mg once a day  On my examination today, the patient reports that his mood is still depressed but some improved from yesterday.  Reports anxiety waxes and wanes.  Denies any side effects since starting Lexapro, and increasing the dose to 10 mg this morning.  Previously the patient had really bad GI side effects to SSRI when he was taking antidepressants about 8 years ago.  Denies any suicidal thoughts today.  Reports that sleep is better.  Reports appetite is stable.  Concentration is okay.  Denies any psychotic symptoms or HI.   Principal Problem: MDD (major depressive disorder), recurrent severe, without psychosis (HCC) Diagnosis: Principal Problem:   MDD (major depressive disorder), recurrent severe, without psychosis (HCC) Active Problems:   GAD (generalized anxiety disorder)   Severe alcohol use disorder, in sustained remission (HCC)   Low degree of dependence on nicotine  Total Time spent with patient:  15 minutes  Past Psychiatric History: See H&P  Past Medical History:  Past Medical History:  Diagnosis Date   Depression     Past Surgical History:  Procedure Laterality Date   DIRECT LARYNGOSCOPY N/A 09/12/2022   Procedure: DIRECT LARYNGOSCOPY;  Surgeon: Christia Reading, MD;  Location: [facility] OR;  Service: ENT;  Laterality: N/A;   ESOPHAGOSCOPY N/A 09/12/2022   Procedure: ESOPHAGOSCOPY;  Surgeon: Christia Reading, MD;  Location: [facility] OR;  Service: ENT;   Laterality: N/A;   FACIAL LACERATION REPAIR Bilateral 09/12/2022   Procedure: NECK LACERATION REPAIR- bilateral;  Surgeon: Christia Reading, MD;  Location: [facility] OR;  Service: ENT;  Laterality: Bilateral;   INCISION AND DRAINAGE FOOT Right    skin grafts Right    Foot   Family History: History reviewed. No pertinent family history.  Family Psychiatric  History: See H&P.  Social History:  Social History   Substance and Sexual Activity  Alcohol Use Not Currently     Social History   Substance and Sexual Activity  Drug Use Not Currently    Social History   Socioeconomic History   Marital status: Single    Spouse name: Not on file   Number of children: Not on file   Years of education: Not on file   Highest education level: Not on file  Occupational History   Not on file  Tobacco Use   Smoking status: Every Day    Packs/day: 0.25    Years: 35.00    Additional pack years: 0.00    Total pack years: 8.75    Types: Cigarettes   Smokeless tobacco: Never  Vaping Use   Vaping Use: Former  Substance and Sexual Activity   Alcohol use: Not Currently   Drug use: Not Currently   Sexual activity: Not Currently  Other Topics Concern   Not on file  Social History Narrative   Not on file   Social Determinants of Health   Financial Resource Strain: Not on file  Food Insecurity: No Food Insecurity (09/15/2022)   Hunger Vital Sign  Worried About Programme researcher, broadcasting/film/video in the Last Year: Never true    Ran Out of Food in the Last Year: Never true  Transportation Needs: No Transportation Needs (09/15/2022)   PRAPARE - Administrator, Civil Service (Medical): No    Lack of Transportation (Non-Medical): No  Physical Activity: Not on file  Stress: Not on file  Social Connections: Not on file   Additional Social History:    Current Medications: Current Facility-Administered Medications  Medication Dose Route Frequency Provider Last Rate Last Admin   acetaminophen (TYLENOL)  tablet 650 mg  650 mg Oral Q6H PRN Starkes-Perry, Juel Burrow, FNP       alum & mag hydroxide-simeth (MAALOX/MYLANTA) 200-200-20 MG/5ML suspension 30 mL  30 mL Oral Q4H PRN Starkes-Perry, Juel Burrow, FNP       diphenhydrAMINE (BENADRYL) capsule 50 mg  50 mg Oral TID PRN Maryagnes Amos, FNP       Or   diphenhydrAMINE (BENADRYL) injection 50 mg  50 mg Intramuscular TID PRN Starkes-Perry, Juel Burrow, FNP       docusate sodium (COLACE) capsule 100 mg  100 mg Oral BID PRN Maryagnes Amos, FNP       escitalopram (LEXAPRO) tablet 10 mg  10 mg Oral Daily Aeriel Boulay, Harrold Donath, MD   10 mg at 09/18/22 4098   hydrOXYzine (ATARAX) tablet 25 mg  25 mg Oral TID PRN Phineas Inches, MD   25 mg at 09/16/22 2125   magnesium hydroxide (MILK OF MAGNESIA) suspension 30 mL  30 mL Oral Daily PRN Maryagnes Amos, FNP       oxyCODONE (Oxy IR/ROXICODONE) immediate release tablet 5 mg  5 mg Oral Q6H PRN Starleen Blue, NP       traZODone (DESYREL) tablet 50 mg  50 mg Oral QHS PRN Rmoni Keplinger, Harrold Donath, MD       Lab Results:  Results for orders placed or performed during the hospital encounter of 09/15/22 (from the past 48 hour(s))  Miscellaneous [facility] test (send-out)     Status: None   Collection Time: 09/17/22  6:30 AM  Result Value Ref Range   [facility] test code [id number]    [facility] test name VD 25    Source ([facility]) SERUM     Comment: Performed at [facility], [address], [place], [place] [address]   Misc [facility] result COMMENT     Comment: (NOTE) Test Ordered: [phone] Vitamin D, 25-Hydroxy Vitamin D, 25-Hydroxy          10.9        [L ] ng/mL    BN     Reference Range: 30.0-100.0                            Vitamin D deficiency has been defined by the Institute of Medicine and an Endocrine Society practice guideline as a level of serum 25-OH vitamin D less than 20 ng/mL (1,2). The Endocrine Society went on to further define vitamin D insufficiency as a level between 21 and 29 ng/mL (2). 1.  IOM (Institute of Medicine). 2010. Dietary reference   intakes for calcium and D. [place]: The   [employer]. 2. Holick MF, Binkley Vici, Bischoff-Ferrari HA, et al.   Evaluation, treatment, and prevention of vitamin D   deficiency: an Endocrine Society clinical practice   guideline. JCEM. 2011 Jul; 96(7):1911-30. Performed At: [facility] [place] [address] [place], [place]  [id number] Jolene Schimke MD WU:[id number]   CBC with Differential/Platelet     Status: Abnormal   Collection Time: 09/17/22  6:31 AM  Result Value Ref Range   WBC 8.9 4.0 - 10.5 K/uL   RBC 3.78 (L) 4.22 - 5.81 MIL/uL   Hemoglobin 11.3 (L) 13.0 - 17.0 g/dL   HCT 95.6 (L) 21.3 - 08.6 %   MCV 89.9 80.0 - 100.0 fL   MCH 29.9 26.0 - 34.0 pg   MCHC 33.2 30.0 - 36.0 g/dL   RDW 57.8 46.9 - 62.9 %   Platelets 257 150 - 400 K/uL   nRBC 0.0 0.0 - 0.2 %   Neutrophils Relative % 65 %   Neutro Abs 5.7 1.7 - 7.7 K/uL   Lymphocytes Relative 28 %   Lymphs Abs 2.5 0.7 - 4.0 K/uL   Monocytes Relative 4 %   Monocytes Absolute 0.4 0.1 - 1.0 K/uL   Eosinophils Relative 3 %   Eosinophils Absolute 0.3 0.0 - 0.5 K/uL   Basophils Relative 0 %   Basophils Absolute 0.0 0.0 - 0.1 K/uL   Immature Granulocytes 0 %   Abs Immature Granulocytes 0.03 0.00 - 0.07 K/uL    Comment: Performed at [facility], [address], [place], [place] [address]  Hemoglobin A1c     Status: None   Collection Time: 09/17/22  6:31 AM  Result Value Ref Range   Hgb A1c MFr Bld 5.6 4.8 - 5.6 %    Comment: (NOTE)         Prediabetes: 5.7 - 6.4         Diabetes: >6.4         Glycemic control for adults with diabetes: <7.0    Mean Plasma Glucose 114 mg/dL    Comment: (NOTE) Performed At: [facility] [place] [address] [place], [place] [id number] Jolene Schimke MD OZ:[id number]   Lipid panel     Status: None   Collection Time: 09/17/22  6:31 AM  Result Value Ref Range   Cholesterol 136 0 - 200 mg/dL    Triglycerides 259 <563 mg/dL   HDL 44 >87 mg/dL   Total CHOL/HDL Ratio 3.1 RATIO   VLDL 23 0 - 40 mg/dL   LDL Cholesterol 69 0 - 99 mg/dL    Comment:        Total Cholesterol/HDL:CHD Risk Coronary Heart Disease Risk Table                     Men   Women  1/2 Average Risk   3.4   3.3  Average Risk       5.0   4.4  2 X Average Risk   9.6   7.1  3 X Average Risk  23.4   11.0        Use the calculated Patient Ratio above and the CHD Risk Table to determine the patient's CHD Risk.        ATP III CLASSIFICATION (LDL):  <100     mg/dL   Optimal  564-332  mg/dL   Near or Above                    Optimal  130-159  mg/dL   Borderline  951-884  mg/dL   High  >166     mg/dL   Very High Performed at [facility], [address], [place], [place] [address]   Comprehensive metabolic panel  Status: Abnormal   Collection Time: 09/17/22  6:31 AM  Result Value Ref Range   Sodium 136 135 - 145 mmol/L   Potassium 4.2 3.5 - 5.1 mmol/L   Chloride 106 98 - 111 mmol/L   CO2 23 22 - 32 mmol/L   Glucose, Bld 106 (H) 70 - 99 mg/dL    Comment: Glucose reference range applies only to samples taken after fasting for at least 8 hours.   BUN 15 6 - 20 mg/dL   Creatinine, Ser 1.91 0.61 - 1.24 mg/dL   Calcium 8.5 (L) 8.9 - 10.3 mg/dL   Total Protein 6.5 6.5 - 8.1 g/dL   Albumin 3.4 (L) 3.5 - 5.0 g/dL   AST 50 (H) 15 - 41 U/L   ALT 62 (H) 0 - 44 U/L   Alkaline Phosphatase 53 38 - 126 U/L   Total Bilirubin 0.4 0.3 - 1.2 mg/dL   GFR, Estimated >47 >82 mL/min    Comment: (NOTE) Calculated using the CKD-EPI Creatinine Equation (2021)    Anion gap 7 5 - 15    Comment: Performed at [facility], [address] [place], [place] [address]  Urinalysis, Routine w reflex microscopic -Urine, Clean Catch     Status: Abnormal   Collection Time: 09/17/22  6:29 PM  Result Value Ref Range   Color, Urine YELLOW YELLOW   APPearance TURBID (A) CLEAR   Specific Gravity, Urine  1.025 1.005 - 1.030   pH 5.0 5.0 - 8.0   Glucose, UA 150 (A) NEGATIVE mg/dL   Hgb urine dipstick SMALL (A) NEGATIVE   Bilirubin Urine NEGATIVE NEGATIVE   Ketones, ur NEGATIVE NEGATIVE mg/dL   Protein, ur NEGATIVE NEGATIVE mg/dL   Nitrite NEGATIVE NEGATIVE   Leukocytes,Ua NEGATIVE NEGATIVE   RBC / HPF 0-5 0 - 5 RBC/hpf   WBC, UA 0-5 0 - 5 WBC/hpf   Bacteria, UA RARE (A) NONE SEEN   Squamous Epithelial / HPF 0-5 0 - 5 /HPF   Mucus PRESENT     Comment: Performed at [facility], [address], [place], [place] [address]   Blood Alcohol level:  Lab Results  Component Value Date   ETH <10 09/12/2022   Metabolic Disorder Labs: Lab Results  Component Value Date   HGBA1C 5.6 09/17/2022   MPG 114 09/17/2022   No results found for: "PROLACTIN" Lab Results  Component Value Date   CHOL 136 09/17/2022   TRIG 113 09/17/2022   HDL 44 09/17/2022   CHOLHDL 3.1 09/17/2022   VLDL 23 09/17/2022   LDLCALC 69 09/17/2022    Physical Findings: AIMS:  , ,  ,  ,    CIWA:    COWS:     Musculoskeletal: Strength & Muscle Tone: within normal limits Gait & Station: normal Patient leans: N/A  Psychiatric Specialty Exam:  Presentation  General Appearance:  Casual  Eye Contact: Fair  Speech: Normal Rate  Speech Volume: Normal  Handedness: Right   Mood and Affect  Mood: Anxious; Depressed  Affect: Depressed   Thought Process  Thought Processes: Linear  Descriptions of Associations:Intact  Orientation:Full (Time, Place and Person)  Thought Content:Logical  History of Schizophrenia/Schizoaffective disorder:No data recorded Duration of Psychotic Symptoms:No data recorded Hallucinations:Hallucinations: None   Ideas of Reference:None  Suicidal Thoughts:Suicidal Thoughts: No   Homicidal Thoughts:Homicidal Thoughts: No    Sensorium  Memory: Immediate Fair; Recent Fair; Remote Fair  Judgment: Fair  Insight: Fair  Restaurant manager, fast food  Concentration: Fair  Attention Span: Fair  Recall: Good  Fund of Knowledge: Good  Language: Good  Psychomotor Activity  Psychomotor Activity: Psychomotor Activity: Normal   Assets  Assets: Communication Skills; Housing; Social Support  Sleep  Sleep: Sleep: Fair   Physical Exam: Physical Exam Vitals and nursing note reviewed.  Constitutional:      General: He is not in acute distress.    Appearance: He is not toxic-appearing.  Cardiovascular:     Rate and Rhythm: Normal rate.     Comments: Elevated pulse rate: 114 Pulmonary:     Effort: Pulmonary effort is normal. No respiratory distress.  Genitourinary:    Comments: Deferred Musculoskeletal:        General: Normal range of motion.  Skin:    General: Skin is warm.     Comments: Self inflicted laceration to throat area with stiches intact. There are drainage or redness noted.  Neurological:     General: No focal deficit present.     Mental Status: He is alert and oriented to person, place, and time.     Motor: No weakness.     Gait: Gait normal.  Psychiatric:        Behavior: Behavior normal.        Judgment: Judgment normal.    Review of Systems  Constitutional:  Negative for chills, diaphoresis and fever.  HENT:  Negative for congestion and sore throat.   Respiratory:  Negative for cough, shortness of breath and wheezing.   Cardiovascular:  Negative for chest pain and palpitations.  Gastrointestinal:  Negative for abdominal pain, constipation, diarrhea, heartburn, nausea and vomiting.  Musculoskeletal:  Negative for joint pain and myalgias.  Neurological:  Negative for dizziness, tingling, tremors and headaches.  Psychiatric/Behavioral:  Positive for depression. Negative for hallucinations, memory loss, substance abuse and suicidal ideas. The patient is nervous/anxious. The patient does not have insomnia.   All other systems reviewed and are negative.  Blood pressure 125/80, pulse (!)  107, temperature 98.1 F (36.7 C), temperature source Oral, resp. rate 16, height 5\' 8"  (1.727 m), weight 81.8 kg, SpO2 100 %. Body mass index is 27.43 kg/m.  Treatment Plan Summary: Daily contact with patient to assess and evaluate symptoms and progress in treatment and Medication management.   Continue inpatient hospitalization.  Will continue today 09/18/2022 plan as below except where it is noted.   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 -Continue trazodone 50 mg nightly as needed for sleep   Other PRNS -Continue Tylenol 650 mg every 6 hours PRN for mild pain -Continue Maalox 30 mg every 4 hrs PRN for indigestion -Continue Milk of Magnesia as needed every 6 hrs for constipation   Patient educated on the rationales, benefits, and possible side effects of all medications, verbalizes understanding, agreeable to trials.    Bilateral neck area with sutures in place, well approximated, no drainage, edema, or redness noted to area.  Area clean, and dry and intact.  6 sutures to left side of neck, and 4 present on the right.  Patient states there is no discomfort to the area.  Administered tetanus  shot while at the medical Hospital.   Discharge Planning: Social work and case management to assist with discharge planning and identification of hospital follow-up needs prior to discharge Estimated LOS: 3-4 more days Discharge Concerns: Need to establish a safety plan; Medication compliance and effectiveness Discharge Goals: Return home with outpatient referrals for mental  health follow-up including medication management/psychotherapy  Cristy Hilts, MD 09/18/2022, 3:16 PM  Total Time Spent in Direct Patient Care:  I personally spent 30 minutes on the unit in direct patient care. The direct patient care time included face-to-face time with the patient, reviewing the patient's chart, communicating with other professionals, and coordinating care. Greater than 50% of this time was spent in counseling or coordinating care with the patient regarding goals of hospitalization, psycho-education, and discharge planning needs.   Phineas Inches, MD Psychiatrist

## 2022-09-18 NOTE — Group Note (Signed)
Date:  09/18/2022 Time:  10:03 AM  Group Topic/Focus:  Orientation:   The focus of this group is to educate the patient on the purpose and policies of crisis stabilization and provide a format to answer questions about their admission.  The group details unit policies and expectations of patients while admitted.    Participation Level:  Active  Participation Quality:  Appropriate  Affect:  Appropriate  Cognitive:  Appropriate  Insight: Appropriate  Engagement in Group:  None  Modes of Intervention:  Discussion  Additional Comments:     Reymundo Poll 09/18/2022, 10:03 AM

## 2022-09-18 NOTE — Group Note (Signed)
Recreation Therapy Group Note   Group Topic:Other  Group Date: 09/18/2022 Start Time: 1305 End Time: 1347 Facilitators: Briar Sword-McCall, LRT,CTRS Location: [address] Dayroom   Activity Description/Intervention: Therapeutic Drumming. Patients with peers and staff were given the opportunity to engage in a leader facilitated [facility] Group Empowerment Drumming Circle with staff from the [employer], in partnership with [employer]. Teaching laboratory technician and trained [employer], Theodoro Doing leading with LRT observing and documenting intervention and pt response. This evidenced-based practice targets 7 areas of health and wellbeing in the human experience including: stress-reduction, exercise, self-expression, camaraderie/support, nurturing, spirituality, and music-making (leisure).   Goal Area(s) Addresses:  Patient will engage in pro-social way in music group.  Patient will follow directions of drum leader on the first prompt. Patient will demonstrate no behavioral issues during group.  Patient will identify if a reduction in stress level occurs as a result of participation in therapeutic drum circle.     Affect/Mood: Appropriate   Participation Level: Engaged   Participation Quality: Independent   Behavior: Appropriate   Speech/Thought Process: Focused   Insight: Good   Judgement: Good   Modes of Intervention: Teaching laboratory technician   Patient Response to Interventions:  Engaged   Education Outcome:  Acknowledges education   Clinical Observations/Individualized Feedback:  Pt actively engaged in therapeutic drumming exercise and discussions. Pt was appropriate with peers, staff, and musical equipment for duration of programming.  Pt identified "good" as their feeling after participation in music-based programming. Pt affect congruent/incongruent with verbalized emotion.    Plan: Continue to engage patient in RT group sessions  2-3x/week.   [name] [name], LRT,CTRS 09/18/2022 2:19 PM

## 2022-09-18 NOTE — B[facility] Group Notes (Signed)
Spiritual care group on grief and loss facilitated by chaplain Dyanne Carrel, [facility]  Group Goal:  Support / Education around grief and loss  Members engage in facilitated group support and psycho-social education.  Group Description:  Following introductions and group rules, group members engaged in facilitated group dialog and support around topic of loss, with particular support around experiences of loss in their lives. Group Identified types of loss (relationships / self / things) and identified patterns, circumstances, and changes that precipitate losses. Reflected on thoughts / feelings around loss, normalized grief responses, and recognized variety in grief experience. Group noted Worden's four tasks of grief in discussion.  Group drew on Adlerian / Rogerian, narrative, MI,  Patient Progress: [name] [name] attended and participated in group activities.  Though verbal participation was minimal, he remained engaged for the duration of the group.

## 2022-09-18 NOTE — Progress Notes (Signed)
   09/18/22 0832  Psych Admission Type (Psych Patients Only)  Admission Status Voluntary  Psychosocial Assessment  Patient Complaints None  Eye Contact Fair  Facial Expression Animated  Affect Appropriate to circumstance  Speech Logical/coherent  Interaction Assertive  Motor Activity Other (Comment) (WDL)  Appearance/Hygiene Unremarkable  Behavior Characteristics Cooperative  Mood Pleasant  Thought Process  Coherency WDL  Content WDL  Delusions None reported or observed  Perception WDL  Hallucination None reported or observed  Judgment Poor  Confusion None  Danger to Self  Current suicidal ideation? Denies  Self-Injurious Behavior No self-injurious ideation or behavior indicators observed or expressed   Agreement Not to Harm Self Yes  Description of Agreement verbal  Danger to Others  Danger to Others None reported or observed   Pt observed out on the unit socializing with peers.

## 2022-09-19 NOTE — Progress Notes (Signed)
[facility] MD Progress Note  09/19/2022 4:24 PM [name] [name]  MRN:  [id number]  Reason for admission: 57 yo CM who was taken to the [facility] via EMS on 5/25 after a suicidal attempt by cutting his throat using a butcher knife in the context of worsening depressive symptoms and socioeconomic stressors. Pt was treated, medically cleared, prior to being transferred voluntarily to this [facility] for treatment and stabilization of his mental status.   Two days ago, the psychiatry team made the following recommendations: Increased Lexapro to 10 mg po daily.  On my examination today:  [name] [name] presents alert, oriented x 4. He remains visible on the unit, attending group sessions. He reports, "I'm doing well. My mood is calm. Sleep is good. I'm doing well on all my medicines. I'm attending group sessions. Learning coping skills". [name] [name] currently denies any SIHI, AVH, delusional thoughts or paranoia. He does not appear to be responding to any internal stimuli. There are no changes made on his current plan of care of care. Will continue as already in progress. The self-inflicted lacerations to his neck remains dry & intact. Slightly reddened without any drainage or odor.  Principal Problem: MDD (major depressive disorder), recurrent severe, without psychosis (HCC) Diagnosis: Principal Problem:   MDD (major depressive disorder), recurrent severe, without psychosis (HCC) Active Problems:   GAD (generalized anxiety disorder)   Severe alcohol use disorder, in sustained remission (HCC)   Low degree of dependence on nicotine  Total Time spent with patient:  15 minutes  Past Psychiatric History: See H&P  Past Medical History:  Past Medical History:  Diagnosis Date   Depression     Past Surgical History:  Procedure Laterality Date   DIRECT LARYNGOSCOPY N/A 09/12/2022   Procedure: DIRECT LARYNGOSCOPY;  Surgeon: Christia Reading, MD;  Location: [facility] OR;  Service: ENT;  Laterality: N/A;   ESOPHAGOSCOPY N/A  09/12/2022   Procedure: ESOPHAGOSCOPY;  Surgeon: Christia Reading, MD;  Location: [facility] OR;  Service: ENT;  Laterality: N/A;   FACIAL LACERATION REPAIR Bilateral 09/12/2022   Procedure: NECK LACERATION REPAIR- bilateral;  Surgeon: Christia Reading, MD;  Location: [facility] OR;  Service: ENT;  Laterality: Bilateral;   INCISION AND DRAINAGE FOOT Right    skin grafts Right    Foot   Family History: History reviewed. No pertinent family history.  Family Psychiatric  History: See H&P.  Social History:  Social History   Substance and Sexual Activity  Alcohol Use Not Currently     Social History   Substance and Sexual Activity  Drug Use Not Currently    Social History   Socioeconomic History   Marital status: Single    Spouse name: Not on file   Number of children: Not on file   Years of education: Not on file   Highest education level: Not on file  Occupational History   Not on file  Tobacco Use   Smoking status: Every Day    Packs/day: 0.25    Years: 35.00    Additional pack years: 0.00    Total pack years: 8.75    Types: Cigarettes   Smokeless tobacco: Never  Vaping Use   Vaping Use: Former  Substance and Sexual Activity   Alcohol use: Not Currently   Drug use: Not Currently   Sexual activity: Not Currently  Other Topics Concern   Not on file  Social History Narrative   Not on file   Social Determinants of Health   Financial Resource Strain: Not on file  Food Insecurity: No Food Insecurity (09/15/2022)   Hunger Vital Sign    Worried About Running Out of Food in the Last Year: Never true    Ran Out of Food in the Last Year: Never true  Transportation Needs: No Transportation Needs (09/15/2022)   PRAPARE - Administrator, Civil Service (Medical): No    Lack of Transportation (Non-Medical): No  Physical Activity: Not on file  Stress: Not on file  Social Connections: Not on file   Additional Social History:    Current Medications: Current Facility-Administered  Medications  Medication Dose Route Frequency Provider Last Rate Last Admin   acetaminophen (TYLENOL) tablet 650 mg  650 mg Oral Q6H PRN Starkes-Perry, Juel Burrow, FNP       alum & mag hydroxide-simeth (MAALOX/MYLANTA) 200-200-20 MG/5ML suspension 30 mL  30 mL Oral Q4H PRN Starkes-Perry, Juel Burrow, FNP       diphenhydrAMINE (BENADRYL) capsule 50 mg  50 mg Oral TID PRN Maryagnes Amos, FNP       Or   diphenhydrAMINE (BENADRYL) injection 50 mg  50 mg Intramuscular TID PRN Starkes-Perry, Juel Burrow, FNP       docusate sodium (COLACE) capsule 100 mg  100 mg Oral BID PRN Rosario Adie, Juel Burrow, FNP       escitalopram (LEXAPRO) tablet 10 mg  10 mg Oral Daily Massengill, Harrold Donath, MD   10 mg at 09/19/22 0756   hydrOXYzine (ATARAX) tablet 25 mg  25 mg Oral TID PRN Phineas Inches, MD   25 mg at 09/18/22 2128   magnesium hydroxide (MILK OF MAGNESIA) suspension 30 mL  30 mL Oral Daily PRN Maryagnes Amos, FNP       oxyCODONE (Oxy IR/ROXICODONE) immediate release tablet 5 mg  5 mg Oral Q6H PRN Starleen Blue, NP       traZODone (DESYREL) tablet 50 mg  50 mg Oral QHS PRN Phineas Inches, MD   50 mg at 09/18/22 2128   Lab Results:  Results for orders placed or performed during the hospital encounter of 09/15/22 (from the past 48 hour(s))  Urinalysis, Routine w reflex microscopic -Urine, Clean Catch     Status: Abnormal   Collection Time: 09/17/22  6:29 PM  Result Value Ref Range   Color, Urine YELLOW YELLOW   APPearance TURBID (A) CLEAR   Specific Gravity, Urine 1.025 1.005 - 1.030   pH 5.0 5.0 - 8.0   Glucose, UA 150 (A) NEGATIVE mg/dL   Hgb urine dipstick SMALL (A) NEGATIVE   Bilirubin Urine NEGATIVE NEGATIVE   Ketones, ur NEGATIVE NEGATIVE mg/dL   Protein, ur NEGATIVE NEGATIVE mg/dL   Nitrite NEGATIVE NEGATIVE   Leukocytes,Ua NEGATIVE NEGATIVE   RBC / HPF 0-5 0 - 5 RBC/hpf   WBC, UA 0-5 0 - 5 WBC/hpf   Bacteria, UA RARE (A) NONE SEEN   Squamous Epithelial / HPF 0-5 0 - 5 /HPF   Mucus  PRESENT     Comment: Performed at [facility], [address], [place], [place] [address]   Blood Alcohol level:  Lab Results  Component Value Date   ETH <10 09/12/2022   Metabolic Disorder Labs: Lab Results  Component Value Date   HGBA1C 5.6 09/17/2022   MPG 114 09/17/2022   No results found for: "PROLACTIN" Lab Results  Component Value Date   CHOL 136 09/17/2022   TRIG 113 09/17/2022   HDL 44 09/17/2022   CHOLHDL 3.1 09/17/2022   VLDL 23 09/17/2022  LDLCALC 69 09/17/2022    Physical Findings: AIMS:  , ,  ,  ,    CIWA:    COWS:     Musculoskeletal: Strength & Muscle Tone: within normal limits Gait & Station: normal Patient leans: N/A  Psychiatric Specialty Exam:  Presentation  General Appearance:  Casual  Eye Contact: Fair  Speech: Normal Rate  Speech Volume: Normal  Handedness: Right   Mood and Affect  Mood: Anxious; Depressed  Affect: Depressed   Thought Process  Thought Processes: Linear  Descriptions of Associations:Intact  Orientation:Full (Time, Place and Person)  Thought Content:Logical  History of Schizophrenia/Schizoaffective disorder:No data recorded Duration of Psychotic Symptoms:No data recorded Hallucinations:Hallucinations: None   Ideas of Reference:None  Suicidal Thoughts:Suicidal Thoughts: No   Homicidal Thoughts:Homicidal Thoughts: No    Sensorium  Memory: Immediate Fair; Recent Fair; Remote Fair  Judgment: Fair  Insight: Fair  Art therapist  Concentration: Fair  Attention Span: Fair  Recall: Good  Fund of Knowledge: Good  Language: Good  Psychomotor Activity  Psychomotor Activity: Psychomotor Activity: Normal   Assets  Assets: Communication Skills; Housing; Social Support  Sleep  Sleep: Sleep: Fair   Physical Exam: Physical Exam Vitals and nursing note reviewed.  Constitutional:      General: He is not in acute distress.    Appearance: He is  not toxic-appearing.  Cardiovascular:     Rate and Rhythm: Normal rate.  Pulmonary:     Effort: Pulmonary effort is normal. No respiratory distress.  Genitourinary:    Comments: Deferred Musculoskeletal:        General: Normal range of motion.  Skin:    General: Skin is warm.     Comments: Self inflicted laceration to throat area with stiches intact. There are drainage or redness noted.  Neurological:     General: No focal deficit present.     Mental Status: He is alert and oriented to person, place, and time.     Motor: No weakness.     Gait: Gait normal.  Psychiatric:        Behavior: Behavior normal.        Judgment: Judgment normal.    Review of Systems  Constitutional:  Negative for chills, diaphoresis and fever.  HENT:  Negative for congestion and sore throat.   Respiratory:  Negative for cough, shortness of breath and wheezing.   Cardiovascular:  Negative for chest pain and palpitations.  Gastrointestinal:  Negative for abdominal pain, constipation, diarrhea, heartburn, nausea and vomiting.  Musculoskeletal:  Negative for joint pain and myalgias.  Neurological:  Negative for dizziness, tingling, tremors and headaches.  Psychiatric/Behavioral:  Positive for depression (Improving). Negative for hallucinations, memory loss, substance abuse and suicidal ideas. The patient is not nervous/anxious and does not have insomnia.   All other systems reviewed and are negative.  Blood pressure 100/77, pulse 98, temperature 98.6 F (37 C), temperature source Oral, resp. rate 16, height 5\' 8"  (1.727 m), weight 81.8 kg, SpO2 100 %. Body mass index is 27.43 kg/m.  Treatment Plan Summary: Daily contact with patient to assess and evaluate symptoms and progress in treatment and Medication management.   Continue inpatient hospitalization.  Will continue today 09/19/2022 plan as below except where it is noted.   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 -Continue trazodone 50 mg nightly as needed for sleep   Other PRNS -Continue Tylenol 650 mg every 6 hours PRN for mild pain -Continue Maalox 30 mg every 4 hrs PRN for indigestion -Continue Milk of Magnesia as needed every 6 hrs for constipation   Patient educated on the rationales, benefits, and possible side effects of all medications, verbalizes understanding, agreeable to trials.    Bilateral neck area with sutures in place, well approximated, no drainage, edema, or redness noted to area.  Area clean, and dry and intact.  6 sutures to left side of neck, and 4 present on the right.  Patient states there is no discomfort to the area.  Administered tetanus shot while at the medical Hospital.   Discharge Planning: Social work and case management to assist with discharge planning and identification of hospital follow-up needs prior to discharge Estimated LOS: 3-4 more days Discharge Concerns: Need to establish a safety plan; Medication compliance and effectiveness Discharge Goals: Return home with outpatient referrals for mental health follow-up including medication management/psychotherapy  Armandina Stammer, NP, pmhnp, fnp-bc. 09/19/2022, 4:24 PM Patient ID: [name] [name], male   DOB: 1966-01-21, 57 y.o.   MRN: [id number]

## 2022-09-19 NOTE — Progress Notes (Signed)
   09/19/22 2207  Psych Admission Type (Psych Patients Only)  Admission Status Voluntary  Psychosocial Assessment  Eye Contact Fair  Facial Expression Masked  Affect Appropriate to circumstance  Speech Logical/coherent  Interaction Assertive  Motor Activity Slow  Appearance/Hygiene Unremarkable  Thought Process  Coherency WDL  Content WDL  Delusions None reported or observed  Perception WDL  Hallucination None reported or observed  Judgment Poor  Confusion None  Danger to Self  Current suicidal ideation? Denies (Denies)  Self-Injurious Behavior No self-injurious ideation or behavior indicators observed or expressed  (none)  Agreement Not to Harm Self Yes  Description of Agreement verbal  Danger to Others  Danger to Others None reported or observed   Denies SI/HI.AVH Was calm and cooperative and will continue to monitor

## 2022-09-19 NOTE — Group Note (Signed)
Date:  09/19/2022 Time:  11:10 AM  Group Topic/Focus:  Goals Group:   The focus of this group is to help patients establish daily goals to achieve during treatment and discuss how the patient can incorporate goal setting into their daily lives to aide in recovery.    Participation Level:  Minimal  Participation Quality:  Attentive  Affect:  Appropriate  Cognitive:  Appropriate  Insight: Appropriate  Engagement in Group:  Lacking  Modes of Intervention:  Discussion  Additional Comments:      Reymundo Poll 09/19/2022, 11:10 AM

## 2022-09-19 NOTE — Progress Notes (Addendum)
D. Pt was friendly upon approach, reported sleeping well last night, endorsed having a good appetite, good concentration, and normal energy level. Per pt's self inventory, pt rated his depression,hopelessness and anxiety a 4/4/2, respectively. Pt's stated goal today is "to work on coping skills by going to group therapy sessions." Pt has been visible in the milieu, observed attending groups. Pt currently denies SI/HI and AVH A. Labs and vitals monitored. Pt given and educated on medications. Pt supported emotionally and encouraged to express concerns and ask questions.   R. Pt remains safe with 15 minute checks. Will continue POC.    09/19/22 1400  Psych Admission Type (Psych Patients Only)  Admission Status Voluntary  Psychosocial Assessment  Patient Complaints None  Eye Contact Fair  Facial Expression Animated  Affect Appropriate to circumstance  Speech Logical/coherent  Interaction Assertive  Motor Activity Slow  Appearance/Hygiene Unremarkable  Behavior Characteristics Cooperative  Mood Pleasant  Thought Process  Coherency WDL  Content WDL  Delusions None reported or observed  Perception WDL  Hallucination None reported or observed  Judgment Poor  Confusion None  Danger to Self  Current suicidal ideation? Denies  Self-Injurious Behavior No self-injurious ideation or behavior indicators observed or expressed   Agreement Not to Harm Self Yes  Description of Agreement verbal contract for safety  Danger to Others  Danger to Others None reported or observed

## 2022-09-19 NOTE — [facility] Group Notes (Signed)
[facility] Group Notes:  (Nursing/MHT/Case Management/Adjunct)  Date:  09/19/2022  Time:  2000  Type of Therapy:   wrap up group  Participation Level:  Active  Participation Quality:  Appropriate, Attentive, Sharing, and Supportive  Affect:  Flat  Cognitive:  Alert  Insight:  Improving  Engagement in Group:  Engaged  Modes of Intervention:  Clarification, Education, and Socialization  Summary of Progress/Problems: Positive thinking and self-care were discussed.   Marcille Buffy 09/19/2022, 9:13 PM

## 2022-09-19 NOTE — Group Note (Signed)
LCSW Group Therapy Note  09/19/2022   10:30-11:30am   Topic:  Anger and its Underlying Emotions  Participation Level:  Active  Description of Group:   In this group, patients identified the last situation that provoked them to anger.   We then talked about the Anger Iceberg and the way many underlying feelings lead to feelings of anger.  Focus was placed on how helpful it is to recognize the underlying emotions to anger in order to address these for more permanent resolution.  Because the group was supposed to be on social wellness, CSW continually made the connection between our ability to recognize our feelings and our ability to convey our emotional needs to those around [place] in a more helpful manner.    Therapeutic Goals: Patients will share emotions that commonly incite their anger and how they typically respond Patients will identify how their coping skills work for them and/or against them Patients will explore possible alternative thoughts to their automatic ones Patients will learn that anger itself is normal and that healthier reactions can assist with resolving conflict rather than worsening situations  Summary of Patient Progress:  The patient shared that his recent situation/feeling that led to anger was this past weekend when he was angry with himself for letting things go so far before seeking help.  He was very supportive as other patients shared.  Patient indicated a willingness to try working on the underlying emotions in order to feel better both in the short-term and in the long-run.  Patient verbalized an understanding that their social relationships and other people's ability to support them in the ways needed can be deeply influenced by their ability to identify the feelings, the thoughts that generated those feelings, and the actions that came out of those feelings.  Patient also appeared to comprehend when CSW talked about this being a 2-way communication with possible mistakes  on both sides.    Therapeutic Modalities:   Cognitive Behavioral Therapy Processing  Lynnell Chad, MSW, LCSW

## 2022-09-20 NOTE — Progress Notes (Signed)
[facility] MD Progress Note  09/20/2022 1:38 PM [name] [name]  MRN:  [id number]  Reason for admission: 57 yo CM who was taken to the [facility] via EMS on 5/25 after a suicidal attempt by cutting his throat using a butcher knife in the context of worsening depressive symptoms and socioeconomic stressors. Pt was treated, medically cleared, prior to being transferred voluntarily to this [facility] for treatment and stabilization of his mental status.   Daily notes: [name] [name] is seen, chart reviewed. The chart findings discussed with the treatment team. He presents alert, oriented & aware of situation. He is visible on the unit, attending group sessions. He presents with an improving affect, good eye contact & verbally responsive. He reports, "I'm doing a lot better, mentally, physically & emotionally. However, there have been some anxiety symptoms here & there, from time to time.  The anxiety symptoms are not very bothersome. I'm able to handle them quite well. I slept well last night. I'm taking my medicines. I think they are helping. The thing I need to most now is to have my beard trimmed. I have been told that I could not get it trimmed here. I will trim it myself once I get home. [name] currently denies any SIHI, AVH, delusional thoughts or paranoia. He does not appear to be responding to any internal stimuli. The self-inflicted lacerations to his neck areas with stitches remain intact with slight redness & mind swellings present. There are no signs or symptoms of infections (drainage, foul odor) observed. Patient at this time denies any pain or discomforts. Range of motion to his neck area intact & flexible. There no changes made on the current plan of care. Will continue as already in progress. Patient may be showing signs of readiness for discharge.  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 OR;  Service: ENT;  Laterality: N/A;   ESOPHAGOSCOPY N/A 09/12/2022   Procedure: ESOPHAGOSCOPY;  Surgeon: Christia Reading, MD;  Location: [facility] OR;  Service: ENT;  Laterality: N/A;   FACIAL LACERATION REPAIR Bilateral 09/12/2022   Procedure: NECK LACERATION REPAIR- bilateral;  Surgeon: Christia Reading, MD;  Location: [facility] OR;  Service: ENT;  Laterality: Bilateral;   INCISION AND DRAINAGE FOOT Right    skin grafts Right    Foot   Family History: History reviewed. No pertinent family history.  Family Psychiatric  History: See H&P.  Social History:  Social History   Substance and Sexual Activity  Alcohol Use Not Currently     Social History   Substance and Sexual Activity  Drug Use Not Currently    Social History   Socioeconomic History   Marital status: Single    Spouse name: Not on file   Number of children: Not on file   Years of education: Not on file   Highest education level: Not on file  Occupational History   Not on file  Tobacco Use   Smoking status: Every Day    Packs/day: 0.25    Years: 35.00    Additional pack years: 0.00    Total pack years: 8.75    Types: Cigarettes   Smokeless tobacco: Never  Vaping Use   Vaping Use: Former  Substance and Sexual Activity   Alcohol use: Not Currently   Drug use: Not Currently   Sexual activity: Not Currently  Other Topics Concern   Not on file  Social History Narrative   Not on file   Social Determinants of  Health   Financial Resource Strain: Not on file  Food Insecurity: No Food Insecurity (09/15/2022)   Hunger Vital Sign    Worried About Running Out of Food in the Last Year: Never true    Ran Out of Food in the Last Year: Never true  Transportation Needs: No Transportation Needs (09/15/2022)   PRAPARE - Administrator, Civil Service (Medical): No    Lack of Transportation (Non-Medical): No  Physical Activity: Not on file  Stress: Not on file  Social Connections: Not on file   Additional Social History:   Current Medications: Current Facility-Administered Medications  Medication Dose Route Frequency Provider Last Rate Last Admin   acetaminophen (TYLENOL) tablet 650 mg  650 mg Oral Q6H PRN Starkes-Perry, Juel Burrow, FNP       alum & mag hydroxide-simeth (MAALOX/MYLANTA) 200-200-20 MG/5ML suspension 30 mL  30 mL Oral Q4H PRN Starkes-Perry, Juel Burrow, FNP       diphenhydrAMINE (BENADRYL) capsule 50 mg  50 mg Oral TID PRN Maryagnes Amos, FNP       Or   diphenhydrAMINE (BENADRYL) injection 50 mg  50 mg Intramuscular TID PRN Starkes-Perry, Juel Burrow, FNP       docusate sodium (COLACE) capsule 100 mg  100 mg Oral BID PRN Starkes-Perry, Juel Burrow, FNP       escitalopram (LEXAPRO) tablet 10 mg  10 mg Oral Daily Massengill, Nathan, MD   10 mg at 09/20/22 0747   hydrOXYzine (ATARAX) tablet 25 mg  25 mg Oral TID PRN Phineas Inches, MD   25 mg at 09/18/22 2128   magnesium hydroxide (MILK OF MAGNESIA) suspension 30 mL  30 mL Oral Daily PRN Maryagnes Amos, FNP       oxyCODONE (Oxy IR/ROXICODONE) immediate release tablet 5 mg  5 mg Oral Q6H PRN Starleen Blue, NP       traZODone (DESYREL) tablet 50 mg  50 mg Oral QHS PRN Phineas Inches, MD   50 mg at 09/19/22 2106   Lab Results:  No results found for this or any previous visit (from the past 48 hour(s)).  Blood Alcohol level:  Lab Results  Component Value Date   ETH <10 09/12/2022   Metabolic Disorder Labs: Lab Results   Component Value Date   HGBA1C 5.6 09/17/2022   MPG 114 09/17/2022   No results found for: "PROLACTIN" Lab Results  Component Value Date   CHOL 136 09/17/2022   TRIG 113 09/17/2022   HDL 44 09/17/2022   CHOLHDL 3.1 09/17/2022   VLDL 23 09/17/2022   LDLCALC 69 09/17/2022   Physical Findings: AIMS:  , ,  ,  ,    CIWA:    COWS:     Musculoskeletal: Strength & Muscle Tone: within normal limits Gait & Station: normal Patient leans: N/A  Psychiatric Specialty Exam:  Presentation  General Appearance:  Casual  Eye Contact: Good  Speech: Clear and  Coherent; Normal Rate  Speech Volume: Normal  Handedness: Right  Mood and Affect  Mood: Anxious  Affect: Congruent  Thought Process  Thought Processes: Coherent; Goal Directed; Linear  Descriptions of Associations:Intact  Orientation:Full (Time, Place and Person)  Thought Content:Logical  History of Schizophrenia/Schizoaffective disorder:No data recorded Duration of Psychotic Symptoms:No data recorded Hallucinations:Hallucinations: None  Ideas of Reference:None  Suicidal Thoughts:Suicidal Thoughts: No SI Passive Intent and/or Plan: Without Intent; Without Plan; Without Means to Carry Out; Without Access to Means   Homicidal Thoughts:Homicidal Thoughts: No  Sensorium  Memory: Immediate Good; Recent Good; Remote Good  Judgment: Fair  Insight: Fair  Art therapist  Concentration: Good  Attention Span: Good  Recall: Good  Fund of Knowledge: Fair  Language: Good  Psychomotor Activity  Psychomotor Activity: Psychomotor Activity: Normal  Assets  Assets: Communication Skills; Desire for Improvement; Financial Resources/Insurance; Housing; Social Support; Resilience  Sleep  Sleep: Sleep: Good Number of Hours of Sleep: 8  Physical Exam: Physical Exam Vitals and nursing note reviewed.  Constitutional:      General: He is not in acute distress.    Appearance: He is not  toxic-appearing.  HENT:     Mouth/Throat:     Pharynx: Oropharynx is clear.  Cardiovascular:     Rate and Rhythm: Normal rate.  Pulmonary:     Effort: Pulmonary effort is normal. No respiratory distress.  Genitourinary:    Comments: Deferred Musculoskeletal:        General: Normal range of motion.  Skin:    General: Skin is warm.     Comments: Self inflicted laceration to throat area with stiches intact. There are no drainage or redness noted. The wounds are slightly raised (lightly swollen).  Neurological:     General: No focal deficit present.     Mental Status: He is alert and oriented to person, place, and time.     Motor: No weakness.     Gait: Gait normal.  Psychiatric:        Behavior: Behavior normal.        Judgment: Judgment normal.    Review of Systems  Constitutional:  Negative for chills, diaphoresis and fever.  HENT:  Negative for congestion and sore throat.   Respiratory:  Negative for cough, shortness of breath and wheezing.   Cardiovascular:  Negative for chest pain and palpitations.  Gastrointestinal:  Negative for abdominal pain, constipation, diarrhea, heartburn, nausea and vomiting.  Musculoskeletal:  Negative for joint pain and myalgias.  Neurological:  Negative for dizziness, tingling, tremors and headaches.  Psychiatric/Behavioral:  Positive for depression (Improving). Negative for hallucinations, memory loss, substance abuse and suicidal ideas. The patient is not nervous/anxious and does not have insomnia.   All other systems reviewed and are negative.  Blood pressure 111/77, pulse 92, temperature 98 F (36.7 C), temperature source Oral, resp. rate 20, height 5\' 8"  (1.727 m), weight 81.8 kg, SpO2 99 %. Body mass index is 27.43 kg/m.  Treatment Plan Summary: Daily contact with patient to assess and evaluate symptoms and progress in treatment and Medication management.   Continue inpatient hospitalization.  Will continue today 09/20/2022 plan as below  except where it is noted.   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 -Continue trazodone 50 mg nightly as needed for sleep   Other PRNS -Continue Tylenol 650 mg every 6 hours PRN for mild pain -Continue Maalox 30 mg every 4 hrs PRN for indigestion -Continue Milk of Magnesia as needed every 6 hrs for constipation   Patient educated on the rationales, benefits, and possible side effects of all medications, verbalizes understanding, agreeable to trials.    Bilateral neck area with sutures in place, well approximated, no drainage, edema, or redness noted to area.  Area clean, and dry and intact.  6 sutures to left side of neck, and 4 present on the right.  Patient states there is no discomfort to the area.  Administered tetanus shot while at the medical Hospital.   Discharge Planning: Social work and case management to assist with discharge planning and identification of hospital follow-up needs prior to discharge Estimated LOS: 3-4 more days Discharge Concerns: Need to establish a safety plan; Medication compliance and effectiveness Discharge Goals: Return home with outpatient referrals for mental health follow-up including medication management/psychotherapy  Armandina Stammer, NP, pmhnp, fnp-bc. 09/20/2022, 1:38 PM Patient ID: Akzel Walters, male   DOB: March 04, 1966, 57 y.o.   MRN: [id number] Patient ID: Treveyon Replogle, male   DOB: Nov 29, 1965, 57 y.o.   MRN: [id number]

## 2022-09-20 NOTE — [facility] Suicide Risk Assessment (Signed)
[facility] INPATIENT:  Family/Significant Other Suicide Prevention Education  Suicide Prevention Education:  Education Completed; Armistead Hunting,  (name of family member/significant other) has been identified by the patient as the family member/significant other with whom the patient will be residing, and identified as the person(s) who will aid the patient in the event of a mental health crisis (suicidal ideations/suicide attempt).  With written consent from the patient, the family member/significant other has been provided the following suicide prevention education, prior to the and/or following the discharge of the patient.  The suicide prevention education provided includes the following: Suicide risk factors Suicide prevention and interventions National Suicide Hotline telephone number [facility] assessment telephone number [facility] Emergency Assistance 911 [facility] and/or Residential Mobile Crisis Unit telephone number  Request made of family/significant other to: Remove weapons (e.g., guns, rifles, knives), all items previously/currently identified as safety concern.   Remove drugs/medications (over-the-counter, prescriptions, illicit drugs), all items previously/currently identified as a safety concern.  The family member/significant other verbalizes understanding of the suicide prevention education information provided.  The family member/significant other agrees to remove the items of safety concern listed above.  Verna Czech Elmo 09/20/2022, 12:18 PM

## 2022-09-20 NOTE — Group Note (Signed)
Date:  09/20/2022 Time:  4:51 PM  Group Topic/Focus:  Building Self Esteem:   The Focus of this group is helping patients become aware of the effects of self-esteem on their lives, the things they and others do that enhance or undermine their self-esteem, seeing the relationship between their level of self-esteem and the choices they make and learning ways to enhance self-esteem. Dimensions of Wellness:   The focus of this group is to introduce the topic of wellness and discuss the role each dimension of wellness plays in total health. Emotional Education:   The focus of this group is to discuss what feelings/emotions are, and how they are experienced.    Participation Level:  Active  Participation Quality:  Appropriate  Affect:  Appropriate  Cognitive:  Appropriate  Insight: Appropriate  Engagement in Group:  Engaged  Modes of Intervention:  Activity, Education, and Support  Additional Comments:   Pt attended and participated in the Emotional Wellness group. Pt completed the self-esteem and self- love building activity.  Edmund Hilda Manami Tutor 09/20/2022, 4:51 PM

## 2022-09-20 NOTE — [facility] Group Notes (Signed)
[facility] Group Notes:  (Nursing)  Date:  09/20/2022  Time:  1415  Type of Therapy:  Psychoeducational Skills  Participation Level:  Minimal  Participation Quality:  Attentive  Affect:  Appropriate  Cognitive:  Appropriate  Insight:  Appropriate  Engagement in Group:  Improving  Modes of Intervention:  Activity, Exploration, Rapport Building, Socialization, and Support  Summary of Progress/Problems:  [name] [name] 09/20/2022, 3:25 PM

## 2022-09-20 NOTE — Progress Notes (Addendum)
D. Pt has been calm and cooperative- appropriate during interactions- visible in the milieu throughout the shift, observed interacting well with peers and attending groups. Per pt's self inventory, pt rated his depression,hopelessness and anxiety a 4/4/2, respectively. Pt's stated goal today is "to continue to socialize and feel better."   Pt currently denies SI/HI and AVH  A. Labs and vitals monitored. Pt given and educated on medications. Pt supported emotionally and encouraged to express concerns and ask questions.   R. Pt remains safe with 15 minute checks. Will continue POC.   09/20/22 1000  Psych Admission Type (Psych Patients Only)  Admission Status Voluntary  Psychosocial Assessment  Patient Complaints None  Eye Contact Fair  Facial Expression Animated  Affect Appropriate to circumstance  Speech Logical/coherent  Interaction Assertive  Motor Activity Slow  Appearance/Hygiene Unremarkable  Behavior Characteristics Cooperative;Calm  Mood Pleasant  Thought Process  Coherency WDL  Content WDL  Delusions None reported or observed  Perception WDL  Hallucination None reported or observed  Judgment Poor  Confusion None  Danger to Self  Current suicidal ideation? Denies  Self-Injurious Behavior No self-injurious ideation or behavior indicators observed or expressed   Agreement Not to Harm Self Yes  Description of Agreement verbal contract for safety  Danger to Others  Danger to Others None reported or observed

## 2022-09-20 NOTE — Group Note (Signed)
Date:  09/20/2022 Time:  1:21 PM  Group Topic/Focus:  Goals Group:   The focus of this group is to help patients establish daily goals to achieve during treatment and discuss how the patient can incorporate goal setting into their daily lives to aide in recovery. Orientation:   The focus of this group is to educate the patient on the purpose and policies of crisis stabilization and provide a format to answer questions about their admission.  The group details unit policies and expectations of patients while admitted.    Participation Level:  Active  Participation Quality:  Appropriate  Affect:  Appropriate  Cognitive:  Appropriate  Insight: Appropriate  Engagement in Group:  Engaged  Modes of Intervention:  Discussion, Orientation, and Support  Additional Comments:   Pt attended and participated in the Orientation/Goals group. Pt personal goal is to be more open to trying new things and coping skills.  Edmund Hilda Kearney Evitt 09/20/2022, 1:21 PM

## 2022-09-21 ENCOUNTER — Encounter (HOSPITAL_COMMUNITY): Payer: Self-pay | Admitting: Otolaryngology

## 2022-09-21 MED ORDER — ACETAMINOPHEN 325 MG PO TABS: 650.0000 mg | ORAL_TABLET | Freq: Four times a day (QID) | ORAL | Status: AC | PRN

## 2022-09-21 MED ORDER — TRAZODONE HCL 50 MG PO TABS: 50.0000 mg | ORAL_TABLET | Freq: Every evening | ORAL | 0 refills | Status: AC | PRN

## 2022-09-21 MED ORDER — HYDROXYZINE HCL 25 MG PO TABS: 25.0000 mg | ORAL_TABLET | Freq: Three times a day (TID) | ORAL | 0 refills | Status: AC | PRN

## 2022-09-21 MED ORDER — ESCITALOPRAM OXALATE 10 MG PO TABS: 10.0000 mg | ORAL_TABLET | Freq: Every day | ORAL | 0 refills | Status: AC

## 2022-09-21 NOTE — Progress Notes (Signed)
  [facility] Adult Case Management Discharge Plan :  Will you be returning to the same living situation after discharge:  Yes,  [name] [name] mom [phone] At discharge, do you have transportation home?: Yes,  [name] [name] mom [phone] Do you have the ability to pay for your medications: Yes,  Insured  Release of information consent forms completed and in the chart;  Patient's signature needed at discharge.  Patient to Follow up at:  Follow-up Information     [facility] Follow up on 09/29/2022.   Why: You are scheduled for a virtual appointment with [facility] on June 11th at 8:00am.  Vesta Mixer will contact you at the phone number listed on file.  Thank you. Contact information: [address]  [address] [place] [address] [phone]                 Next level of care provider has access to [facility] Link:yes  Safety Planning and Suicide Prevention discussed: Yes,  [name] [name] mom [phone]     Has patient been referred to the Quitline?: Yes, faxed/e-referral on 09-21-2022  Patient has been referred for addiction treatment: Yes, the patient will follow up with an outpatient provider for substance use disorder. Psychiatrist/APP: appointment made and Therapist: appointment made. [facility] Patient to continue working towards treatment goals after discharge. Patient no longer meets criteria for inpatient criteria per attending physician. Continue taking medications as prescribed, nursing to provide instructions at discharge. Follow up with all scheduled appointments.   [name] [name] S Eldredge Veldhuizen, LCSW 09/21/2022, 11:18 AM

## 2022-09-21 NOTE — Discharge Instructions (Signed)
Please establish PCP care - below is an option for you, must call this clinic to set up an appt  [facility]  Phone: [phone]  Address: [address] [place],  [place], [place] [address]   Hours: Monday to Friday; 8:30AM - 5PM   Go to an Emergency Department or urgent care center, within 5 days of discharge to have stitches removed and to have your wound evaluated.  If your wound becomes painful, more red, warm, or draining pus, please go to the ED immediately.    -Follow-up with your outpatient psychiatric provider -instructions on appointment date, time, and address (location) are provided to you in discharge paperwork.  -Take your psychiatric medications as prescribed at discharge - instructions are provided to you in the discharge paperwork  -Follow-up with outpatient primary care doctor and other specialists -for management of preventative medicine and any chronic medical disease.  -Recommend abstinence from alcohol, tobacco, and other illicit drug use at discharge.   -If your psychiatric symptoms recur, worsen, or if you have side effects to your psychiatric medications, call your outpatient psychiatric provider, 911, [phone] or go to the nearest emergency department.  -If suicidal thoughts occur, call your outpatient psychiatric provider, 911, [phone] or go to the nearest emergency department.  Naloxone (Narcan) can help reverse an overdose when given to the victim quickly.  [facility] offers free naloxone kits and instructions/training on its use.  Add naloxone to your first aid kit and you can help save a life.   Pick up your free kit at the following locations:   [place]:  [facility], [address] [place] [address] [phone]) [facility] [address] [place] [address] [phone]) [facility] [address] [place] [address]  [place]: [facility] [address] [place] [address] ([phone]) [facility] [address] [place] [address] [phone])

## 2022-09-21 NOTE — Discharge Summary (Signed)
Physician Discharge Summary Note  Patient:  [name] [name] is an 57 y.o., male MRN:  [id number] DOB:  1965/04/22 Patient phone:  [phone] (home)  Patient address:   [address] [place] [address],  Total Time spent with patient: 20 minutes  Date of Admission:  09/15/2022 Date of Discharge: 09-21-2022  Reason for Admission:   [name] [name] is a 57 yo CM who was taken to the [facility] via EMS on 5/25 after a suicidal attempt by cutting his throat using a butcher knife in the context of worsening depressive symptoms and socioeconomic stressors. Pt was treated, medically cleared, prior to being transferred voluntarily to this [facility] for treatment and stabilization of his mental status.   Principal Problem: MDD (major depressive disorder), recurrent severe, without psychosis (HCC) Discharge Diagnoses: Principal Problem:   MDD (major depressive disorder), recurrent severe, without psychosis (HCC) Active Problems:   GAD (generalized anxiety disorder)   Severe alcohol use disorder, in sustained remission (HCC)   Low degree of dependence on nicotine   Past Psychiatric History:  Previous Psych Diagnoses: MDD and GAD Prior inpatient treatment: Denies Current/prior outpatient treatment: Unable to recall Prior rehab hx: Denies, states quit alcohol on his own in 2017 Psychotherapy hx: Denies History of suicide attempts: See HPI above History of homicide or aggression: Denies Psychiatric medication history: Unable to recall past medication trials Psychiatric medication compliance history: Noncompliance, states has not taken any psychotropic medications sayings 2015. Neuromodulation history: Denies Current Psychiatrist: None  Past Medical History:  Past Medical History:  Diagnosis Date   Depression     Past Surgical History:  Procedure Laterality Date   DIRECT LARYNGOSCOPY N/A 09/12/2022   Procedure: DIRECT LARYNGOSCOPY;  Surgeon: Christia Reading, MD;   Location: [facility] OR;  Service: ENT;  Laterality: N/A;   ESOPHAGOSCOPY N/A 09/12/2022   Procedure: ESOPHAGOSCOPY;  Surgeon: Christia Reading, MD;  Location: [facility] OR;  Service: ENT;  Laterality: N/A;   FACIAL LACERATION REPAIR Bilateral 09/12/2022   Procedure: NECK LACERATION REPAIR- bilateral;  Surgeon: Christia Reading, MD;  Location: [facility] OR;  Service: ENT;  Laterality: Bilateral;   INCISION AND DRAINAGE FOOT Right    skin grafts Right    Foot   Family History: History reviewed. No pertinent family history.  Family Psychiatric  History:  Medical: Dementia in mother Psych: MDD in brother and MDD and sister Psych Rx: States brother takes medication but he is not sure what he takes SA/HA: Denies Substance use family hx: Denies  Social History:  Social History   Substance and Sexual Activity  Alcohol Use Not Currently     Social History   Substance and Sexual Activity  Drug Use Not Currently    Social History   Socioeconomic History   Marital status: Single    Spouse name: Not on file   Number of children: Not on file   Years of education: Not on file   Highest education level: Not on file  Occupational History   Not on file  Tobacco Use   Smoking status: Every Day    Packs/day: 0.25    Years: 35.00    Additional pack years: 0.00    Total pack years: 8.75    Types: Cigarettes   Smokeless tobacco: Never  Vaping Use   Vaping Use: Former  Substance and Sexual Activity   Alcohol use: Not Currently   Drug use: Not Currently   Sexual activity: Not Currently  Other Topics Concern   Not on file  Social History Narrative   Not on file   Social Determinants of Health   Financial Resource Strain: Not on file  Food Insecurity: No Food Insecurity (09/15/2022)   Hunger Vital Sign    Worried About Running Out of Food in the Last Year: Never true    Ran Out of Food in the Last Year: Never true  Transportation Needs: No Transportation Needs (09/15/2022)   PRAPARE - Doctor, general practice (Medical): No    Lack of Transportation (Non-Medical): No  Physical Activity: Not on file  Stress: Not on file  Social Connections: Not on file    Hospital Course:   During the patient's hospitalization, patient had extensive initial psychiatric evaluation, and follow-up psychiatric evaluations every day.  Psychiatric diagnoses provided upon initial assessment:  MDD (major depressive disorder), recurrent severe, without psychosis (HCC) GAD (generalized anxiety disorder)  Patient's psychiatric medications were adjusted on admission:  -Start Lexapro 5 mg daily for depressive symptoms and GAD -Start hydroxyzine 25 mg 3 times daily as needed for anxiety -Continue oxycodone and change from 5-10 mg every 4 hours to 5 mg as needed every 6 hours for pain.  Patient has not needed the medication since yesterday. -Continue trazodone 50 mg nightly as needed for sleep  During the hospitalization, other adjustments were made to the patient's psychiatric medication regimen:  -lexapro increased to 10 mg once daily   Patient's care was discussed during the interdisciplinary team meeting every day during the hospitalization.  The patient denied having side effects to prescribed psychiatric medication.  Gradually, patient started adjusting to milieu. The patient was evaluated each day by a clinical provider to ascertain response to treatment. Improvement was noted by the patient's report of decreasing symptoms, improved sleep and appetite, affect, medication tolerance, behavior, and participation in unit programming.  Patient was asked each day to complete a self inventory noting mood, mental status, pain, new symptoms, anxiety and concerns.    Symptoms were reported as significantly decreased or resolved completely by discharge.   On day of discharge, the patient reports that their mood is stable. The patient denied having suicidal thoughts for more than 48 hours prior to discharge.   Patient denies having homicidal thoughts.  Patient denies having auditory hallucinations.  Patient denies any visual hallucinations or other symptoms of psychosis. The patient was motivated to continue taking medication with a goal of continued improvement in mental health.   The patient reports their target psychiatric symptoms of depression and suicidal thoughts, all responded well to the psychiatric medications, and the patient reports overall benefit other psychiatric hospitalization. Supportive psychotherapy was provided to the patient. The patient also participated in regular group therapy while hospitalized. Coping skills, problem solving as well as relaxation therapies were also part of the unit programming.  Labs were reviewed with the patient, and abnormal results were discussed with the patient.  The patient is able to verbalize their individual safety plan to this provider.  # It is recommended to the patient to continue psychiatric medications as prescribed, after discharge from the hospital.    # It is recommended to the patient to follow up with your outpatient psychiatric provider and PCP.  # It was discussed with the patient, the impact of alcohol, drugs, tobacco have been there overall psychiatric and medical wellbeing, and total abstinence from substance use was recommended the patient.ed.  # Prescriptions provided or sent directly to preferred pharmacy at discharge. Patient agreeable to plan. Given opportunity to  ask questions. Appears to feel comfortable with discharge.    # In the event of worsening symptoms, the patient is instructed to call the crisis hotline, 911 and or go to the nearest ED for appropriate evaluation and treatment of symptoms. To follow-up with primary care provider for other medical issues, concerns and or health care needs  # Patient was discharged to home with mother and son, with a plan to follow up as noted below.   Physical Findings: AIMS:  , ,  ,   ,    CIWA:    COWS:     Musculoskeletal: Strength & Muscle Tone: within normal limits Gait & Station: normal Patient leans: N/A   Psychiatric Specialty Exam:  Presentation  General Appearance:  Appropriate for Environment; Casual; Fairly Groomed  Eye Contact: Good  Speech: Normal Rate; Clear and Coherent  Speech Volume: Normal  Handedness: Right   Mood and Affect  Mood: Euthymic  Affect: Appropriate; Congruent; Full Range   Thought Process  Thought Processes: Linear  Descriptions of Associations:Intact  Orientation:Full (Time, Place and Person)  Thought Content:Logical  History of Schizophrenia/Schizoaffective disorder:No data recorded Duration of Psychotic Symptoms:No data recorded Hallucinations:Hallucinations: None  Ideas of Reference:None  Suicidal Thoughts:Suicidal Thoughts: No SI Passive Intent and/or Plan: Without Intent; Without Plan; Without Means to Carry Out; Without Access to Means  Homicidal Thoughts:Homicidal Thoughts: No   Sensorium  Memory: Immediate Good; Recent Good; Remote Good  Judgment: Good  Insight: Good   Executive Functions  Concentration: Good  Attention Span: Good  Recall: Good  Fund of Knowledge: Good  Language: Good   Psychomotor Activity  Psychomotor Activity: Psychomotor Activity: Normal   Assets  Assets: Communication Skills; Desire for Improvement; Financial Resources/Insurance; Housing; Social Support; Resilience   Sleep  Sleep: Sleep: Fair Number of Hours of Sleep: 8    Physical Exam: Physical Exam Vitals reviewed.  Constitutional:      General: He is not in acute distress.    Appearance: He is normal weight. He is not toxic-appearing.  Pulmonary:     Effort: Pulmonary effort is normal. No respiratory distress.  Neurological:     Mental Status: He is alert.     Motor: No weakness.     Gait: Gait normal.  Psychiatric:        Mood and Affect: Mood normal.         Behavior: Behavior normal.        Thought Content: Thought content normal.        Judgment: Judgment normal.    Review of Systems  Constitutional:  Negative for chills and fever.  Cardiovascular:  Negative for chest pain and palpitations.  Neurological:  Negative for dizziness, tingling, tremors and headaches.  Psychiatric/Behavioral:  Negative for depression, hallucinations, memory loss, substance abuse and suicidal ideas. The patient is not nervous/anxious and does not have insomnia.   All other systems reviewed and are negative.  Blood pressure 106/60, pulse 92, temperature 98.8 F (37.1 C), temperature source Oral, resp. rate 16, height 5\' 8"  (1.727 m), weight 81.8 kg, SpO2 99 %. Body mass index is 27.43 kg/m.   Social History   Tobacco Use  Smoking Status Every Day   Packs/day: 0.25   Years: 35.00   Additional pack years: 0.00   Total pack years: 8.75   Types: Cigarettes  Smokeless Tobacco Never   Tobacco Cessation:  N/A, patient does not currently use tobacco products   Blood Alcohol level:  Lab Results  Component Value Date  ETH <10 09/12/2022    Metabolic Disorder Labs:  Lab Results  Component Value Date   HGBA1C 5.6 09/17/2022   MPG 114 09/17/2022   No results found for: "PROLACTIN" Lab Results  Component Value Date   CHOL 136 09/17/2022   TRIG 113 09/17/2022   HDL 44 09/17/2022   CHOLHDL 3.1 09/17/2022   VLDL 23 09/17/2022   LDLCALC 69 09/17/2022    See Psychiatric Specialty Exam and Suicide Risk Assessment completed by Attending Physician prior to discharge.  Discharge destination:  Home  Is patient on multiple antipsychotic therapies at discharge:  No   Has Patient had three or more failed trials of antipsychotic monotherapy by history:  No  Recommended Plan for Multiple Antipsychotic Therapies: NA  Discharge Instructions     Diet - low sodium heart healthy   Complete by: As directed    Increase activity slowly   Complete by: As  directed       Allergies as of 09/21/2022   No Known Allergies      Medication List     STOP taking these medications    methocarbamol 500 MG tablet Commonly known as: ROBAXIN       TAKE these medications      Indication  acetaminophen 325 MG tablet Commonly known as: TYLENOL Take 2 tablets (650 mg total) by mouth every 6 (six) hours as needed for mild pain. What changed:  medication strength how much to take reasons to take this  Indication: Pain   docusate sodium 100 MG capsule Commonly known as: COLACE Take 1 capsule (100 mg total) by mouth 2 (two) times daily as needed for mild constipation.  Indication: Constipation   escitalopram 10 MG tablet Commonly known as: LEXAPRO Take 1 tablet (10 mg total) by mouth daily. Start taking on: September 22, 2022 What changed:  medication strength how much to take when to take this  Indication: Major Depressive Disorder   hydrOXYzine 25 MG tablet Commonly known as: ATARAX Take 1 tablet (25 mg total) by mouth 3 (three) times daily as needed for anxiety.  Indication: Feeling Anxious   polyethylene glycol 17 g packet Commonly known as: MIRALAX / GLYCOLAX Take 17 g by mouth daily as needed (constipation).  Indication: Constipation   traZODone 50 MG tablet Commonly known as: DESYREL Take 1 tablet (50 mg total) by mouth at bedtime as needed for sleep.  Indication: Trouble Sleeping, Major Depressive Disorder        Follow-up Information     [facility] Follow up on 09/29/2022.   Why: You are scheduled for a virtual appointment with [facility] on June 11th at 8:00am.  Vesta Mixer will contact you at the phone number listed on file.  Thank you. Contact information: [address]  [address] [place] [address] [phone]                 Follow-up recommendations:    Activity: as tolerated  Diet: heart healthy  Other: -Follow-up with your outpatient psychiatric provider -instructions on appointment date,  time, and address (location) are provided to you in discharge paperwork.  -Take your psychiatric medications as prescribed at discharge - instructions are provided to you in the discharge paperwork  -Follow-up with outpatient primary care doctor and other specialists -for management of preventative medicine and chronic medical disease  -If you are prescribed an atypical antipsychotic medication, we recommend that your outpatient psychiatrist follow routine screening for side effects within 3 months of discharge, including monitoring: AIMS scale,  height, weight, blood pressure, fasting lipid panel, HbA1c, and fasting blood sugar.   -Recommend total abstinence from alcohol, tobacco, and other illicit drug use at discharge.   -If your psychiatric symptoms recur, worsen, or if you have side effects to your psychiatric medications, call your outpatient psychiatric provider, 911, [phone] or go to the nearest emergency department.  -If suicidal thoughts occur, immediately call your outpatient psychiatric provider, 911, 988 or go to the nearest emergency department.    Signed: Cristy Hilts, MD 09/21/2022, 9:00 AM  Total Time Spent in Direct Patient Care:  I personally spent 35 minutes on the unit in direct patient care. The direct patient care time included face-to-face time with the patient, reviewing the patient's chart, communicating with other professionals, and coordinating care. Greater than 50% of this time was spent in counseling or coordinating care with the patient regarding goals of hospitalization, psycho-education, and discharge planning needs.   Phineas Inches, MD Psychiatrist

## 2022-09-21 NOTE — Progress Notes (Addendum)
Patient discharged from [facility] on 09/21/22 at 1343. Patient denies SI, plan, and intention. Suicide safety plan completed, reviewed with this RN, given to the patient, and a copy in the chart. Patient denies HI/AVH upon discharge. Patient is alert, oriented, and cooperative. RN provided patient with discharge paperwork and reviewed information with patient. Patient expressed that he understood all of the discharge instructions. Patient had no belongings in a locker. Discharged patient to [facility] waiting room.

## 2022-09-21 NOTE — Group Note (Signed)
Recreation Therapy Group Note   Group Topic:Team Building  Group Date: 09/21/2022 Start Time: 0930 End Time: 0957 Facilitators: Candas Deemer-McCall, LRT,CTRS Location: [address] Dayroom   Goal Area(s) Addresses:  Patient will effectively work with peer towards shared goal.  Patient will identify skills used to make activity successful.  Patient will identify how skills used during activity can be applied to reach post d/c goals.   Group Description: [employer]. In teams of 5-6, patients were given 11 craft pipe cleaners. Using the materials provided, patients were instructed to compete again the opposing team(s) to build the tallest free-standing structure from floor level. The activity was timed; difficulty increased by Clinical research associate as Production designer, theatre/television/film continued.  Systematically resources were removed with additional directions for example, placing one arm behind their back, working in silence, and shape stipulations. LRT facilitated post-activity discussion reviewing team processes and necessary communication skills involved in completion. Patients were encouraged to reflect how the skills utilized, or not utilized, in this activity can be incorporated to positively impact support systems post discharge.   Affect/Mood: Appropriate   Participation Level: Engaged   Participation Quality: Independent   Behavior: Appropriate   Speech/Thought Process: Focused   Insight: Good   Judgement: Good   Modes of Intervention: STEM Activity   Patient Response to Interventions:  Engaged   Education Outcome:  Acknowledges education   Clinical Observations/Individualized Feedback: Pt attended and participated in group session.     Plan: Continue to engage patient in RT group sessions 2-3x/week.   Rowen Hur-McCall, LRT,CTRS  09/21/2022 12:39 PM

## 2022-09-21 NOTE — Progress Notes (Signed)
   09/20/22 2000  Psych Admission Type (Psych Patients Only)  Admission Status Voluntary  Psychosocial Assessment  Patient Complaints None  Eye Contact Fair  Facial Expression Animated  Affect Appropriate to circumstance  Speech Logical/coherent  Interaction Assertive  Motor Activity Slow  Appearance/Hygiene Unremarkable  Behavior Characteristics Cooperative;Appropriate to situation;Calm  Mood Pleasant  Thought Process  Coherency WDL  Content WDL  Delusions None reported or observed  Perception WDL  Hallucination None reported or observed  Judgment Poor  Confusion None  Danger to Self  Current suicidal ideation? Denies  Self-Injurious Behavior No self-injurious ideation or behavior indicators observed or expressed   Agreement Not to Harm Self Yes  Description of Agreement verbally contracts for safety

## 2022-09-21 NOTE — [facility] Suicide Risk Assessment (Signed)
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 Pt was treated, medically cleared, prior to being transferred voluntarily to this [facility] for treatment and stabilization of his mental status.   Psychiatric diagnoses provided upon initial assessment:  MDD (major depressive disorder), recurrent severe, without psychosis (HCC) GAD (generalized anxiety disorder)   Patient's psychiatric medications were adjusted on admission:  -Start Lexapro 5 mg daily for depressive symptoms and GAD -Start hydroxyzine 25 mg 3 times daily as needed for anxiety -Continue oxycodone and change from 5-10 mg every 4 hours to 5 mg as needed every 6 hours for pain.  Patient has not needed the medication since yesterday. -Continue trazodone 50 mg nightly as needed for sleep   During the hospitalization, other adjustments were made to the patient's psychiatric medication regimen:  -lexapro increased to 10 mg once daily    Psychiatric Specialty Exam  Presentation  General Appearance:  Appropriate for Environment; Casual; Fairly Groomed  Eye Contact: Good  Speech: Normal Rate; Clear and Coherent  Speech Volume: Normal  Handedness: Right   Mood and Affect  Mood: Euthymic  Duration of Depression Symptoms: No data recorded Affect: Appropriate; Congruent; Full Range   Thought  Process  Thought Processes: Linear  Descriptions of Associations:Intact  Orientation:Full (Time, Place and Person)  Thought Content:Logical  History of Schizophrenia/Schizoaffective disorder:No data recorded Duration of Psychotic Symptoms:No data recorded Hallucinations:Hallucinations: None  Ideas of Reference:None  Suicidal Thoughts:Suicidal Thoughts: No SI Passive Intent and/or Plan: Without Intent; Without Plan; Without Means to Carry Out; Without Access to Means  Homicidal Thoughts:Homicidal Thoughts: No   Sensorium  Memory: Immediate Good; Recent Good; Remote Good  Judgment: Good  Insight: Good   Executive Functions  Concentration: Good  Attention Span: Good  Recall: Good  Fund of Knowledge: Good  Language: Good   Psychomotor Activity  Psychomotor Activity: Psychomotor Activity: Normal   Assets  Assets: Communication Skills; Desire for Improvement; Financial Resources/Insurance; Housing; Social Support; Resilience   Sleep  Sleep: Sleep: Fair Number of Hours of Sleep: 8   Physical Exam: Physical Exam See discharge summary  ROS See discharge summary  Blood pressure 106/60, pulse 92, temperature 98.8 F (37.1 C), temperature source Oral, resp. rate 16, height 5\' 8"  (1.727 m), weight 81.8 kg, SpO2 99 %. Body mass index is 27.43 kg/m.  Mental Status Per Nursing Assessment::   On Admission:  Suicidal ideation indicated by patient  Demographic factors:  Male, Caucasian, Unemployed Loss Factors:  NA Historical Factors:  Prior suicide attempts Risk Reduction Factors:  Living with another person, especially a relative, Sense of responsibility to family  Continued Clinical Symptoms:  Mood is stable. Denying SI.   Cognitive Features That Contribute To Risk:  None    Suicide Risk:  Mild:  There are no identifiable suicide plans, no associated intent, mild dysphoria and related symptoms, good self-control (both objective and subjective  assessment), few other risk factors,  and identifiable protective factors, including available and accessible social support.    Follow-up Information     [facility] Follow up on 09/29/2022.   Why: You are scheduled for a virtual appointment with [facility] on June 11th at 8:00am.  Vesta Mixer will contact you at the phone number listed on file.  Thank you. Contact information: [address]  [address] [place] [address] [phone]                 Plan Of Care/Follow-up recommendations:  -Follow-up with your outpatient psychiatric provider -instructions on appointment date, time, and address (location) are provided to you in discharge paperwork.   -Take your psychiatric medications as prescribed at discharge - instructions are provided to you in the discharge paperwork   -Follow-up with outpatient primary care doctor and other specialists -for management of preventative medicine and chronic medical disease   -If you are prescribed an atypical antipsychotic medication, we recommend that your outpatient psychiatrist follow routine screening for side effects within 3 months of discharge, including monitoring: AIMS scale, height, weight, blood pressure, fasting lipid panel, HbA1c, and fasting blood sugar.    -Recommend total abstinence from alcohol, tobacco, and other illicit drug use at discharge.    -If your psychiatric symptoms recur, worsen, or if you have side effects to your psychiatric medications, call your outpatient psychiatric provider, 911, 988 or go to the nearest emergency department.   -If suicidal thoughts occur, immediately call your outpatient psychiatric provider, 911, 988 or go to the nearest emergency department.   Cristy Hilts, MD 09/21/2022, 9:05 AM

## 2022-09-21 NOTE — Progress Notes (Signed)
Adult Psychoeducational Group Note  Date:  09/21/2022 Time:  1:02 AM  Group Topic/Focus:  Wrap-Up Group:   The focus of this group is to help patients review their daily goal of treatment and discuss progress on daily workbooks.  Participation Level:  Active  Participation Quality:  Appropriate  Affect:  Appropriate  Cognitive:  Appropriate  Insight: Appropriate  Engagement in Group:  Engaged  Modes of Intervention:  Discussion and Support  Additional Comments:  Pt states goal today, was to focus on coping skills. Pt states doing little things such as making bed or coloring, helps him feel better. Pt rates day an 8/10. Pt states something positive that happened for the pt, was going outside. Tomorrow, pt wants to continue working on recovery.  Kamri Gotsch Katrinka Blazing 09/21/2022, 1:02 AM

## 2022-09-21 NOTE — Progress Notes (Signed)
   09/21/22 0800  Psych Admission Type (Psych Patients Only)  Admission Status Voluntary  Psychosocial Assessment  Patient Complaints None  Eye Contact Brief  Facial Expression Animated  Affect Appropriate to circumstance  Speech Logical/coherent  Interaction Assertive  Motor Activity Slow  Appearance/Hygiene Unremarkable  Behavior Characteristics Appropriate to situation;Cooperative  Mood Pleasant  Thought Process  Coherency WDL  Content WDL  Delusions None reported or observed  Perception WDL  Hallucination None reported or observed  Judgment Poor  Confusion None  Danger to Self  Current suicidal ideation? Denies  Description of Suicide Plan No plan  Self-Injurious Behavior No self-injurious ideation or behavior indicators observed or expressed   Agreement Not to Harm Self Yes  Description of Agreement Pt verbally contracts for safety  Danger to Others  Danger to Others None reported or observed

## 2022-09-22 ENCOUNTER — Encounter (HOSPITAL_COMMUNITY): Payer: Self-pay | Admitting: Otolaryngology

## 2022-09-28 ENCOUNTER — Telehealth (INDEPENDENT_AMBULATORY_CARE_PROVIDER_SITE_OTHER): Payer: Self-pay | Admitting: Primary Care

## 2022-09-28 NOTE — Telephone Encounter (Signed)
Spoke with pt and will be at apt 6/11 9:30a

## 2022-09-29 ENCOUNTER — Ambulatory Visit (INDEPENDENT_AMBULATORY_CARE_PROVIDER_SITE_OTHER): Payer: Medicaid Other | Admitting: Primary Care

## 2022-09-29 ENCOUNTER — Telehealth: Payer: Self-pay

## 2022-09-29 ENCOUNTER — Encounter (INDEPENDENT_AMBULATORY_CARE_PROVIDER_SITE_OTHER): Payer: Self-pay | Admitting: Primary Care

## 2022-09-29 VITALS — BP 122/80 | HR 66 | Resp 16 | Ht 68.0 in | Wt 188.2 lb

## 2022-09-29 DIAGNOSIS — Z7689 Persons encountering health services in other specified circumstances: Secondary | ICD-10-CM

## 2022-09-29 DIAGNOSIS — F411 Generalized anxiety disorder: Secondary | ICD-10-CM

## 2022-09-29 DIAGNOSIS — F332 Major depressive disorder, recurrent severe without psychotic features: Secondary | ICD-10-CM

## 2022-09-29 DIAGNOSIS — Z1211 Encounter for screening for malignant neoplasm of colon: Secondary | ICD-10-CM

## 2022-09-29 NOTE — Patient Instructions (Addendum)
[phone] Mon-Fri  8-3 lunch 12-1- start the process 2 hrs registration & assessment  [address]  Shingles  Shingles is an infection. It gives you a painful skin rash and blisters that have fluid in them. Shingles is caused by the same germ (virus) that causes chickenpox. Shingles only happens in people who: Have had chickenpox. Have been given a shot (vaccine) to protect against chickenpox. Shingles is rare in this group. What are the causes? This condition is caused by varicella-zoster virus. This is the same germ that causes chickenpox. After a person is exposed to the germ, the germ stays in the body but is not active (dormant). Shingles develops if the germ becomes active again (is reactivated). This can happen many years after the first exposure to the germ. It is not known what causes this germ to become active again. What increases the risk? People who have had chickenpox or received the chickenpox shot are at risk for shingles. This infection is more common in people who: Are older than 57 years of age. Have a weakened disease-fighting system (immune system), such as people with: HIV (human immunodeficiency virus). AIDS (acquired immunodeficiency syndrome). Cancer. Are taking medicines that weaken the immune system, such as organ transplant medicines. Have a lot of stress. What are the signs or symptoms? The first symptoms of shingles may be itching, tingling, or pain in an area on your skin. A rash will show on your skin a few days or weeks later. This is what usually happens: The rash is likely to be on one side of your body. The rash usually has a shape like a belt or a band. Over time, the rash turns into fluid-filled blisters. The blisters will break open and change into scabs. The scabs usually dry up in about 2-3 weeks. You may also have: A fever. Chills. A headache. A feeling like you may vomit (nausea). How is this treated? The  rash may last for several weeks. There is not a specific cure for this condition. Your doctor may prescribe medicines. Medicines may: Help with pain. Help you get better sooner. Help to prevent long-term problems. Help with itching (antihistamines). If the area involved is on your face, you may need to see a specialist. This may be an eye doctor or an ear, nose, and throat (ENT) doctor. Follow these instructions at home: Medicines Take over-the-counter and prescription medicines only as told by your doctor. Put on an anti-itch cream or numbing cream where you have a rash, blisters, or scabs. Do this as told by your doctor. Helping with itching and discomfort  Put cold, wet cloths (cold compresses) on the area of the rash or blisters as told by your doctor. Cool baths can help you feel better. Try adding baking soda or dry oatmeal to the water to lessen itching. Do not bathe in hot water. Use calamine lotion as told by your doctor. Blister and rash care Keep your rash covered with a loose bandage (dressing). Wear loose clothing that does not rub on your rash. Wash your hands with soap and water for at least 20 seconds before and after you change your bandage. If you cannot use soap and water, use hand sanitizer. Change your bandage as told by your doctor. Keep your rash and blisters clean. To do this, wash the area with mild soap and cool water as told by your doctor. Check your rash every day for signs of infection. Check for: More redness,  swelling, or pain. Fluid or blood. Warmth. Pus or a bad smell. Do not scratch your rash. Do not pick at your blisters. To help you to not scratch: Keep your fingernails clean and cut short. Wear gloves or mittens when you sleep, if scratching is a problem. General instructions Rest as told by your doctor. Wash your hands often with soap and water for at least 20 seconds. If you cannot use soap and water, use hand sanitizer. Doing this lowers your  chance of getting a skin infection. Your infection can cause chickenpox in people who have never had chickenpox or never got a chickenpox vaccine shot. If you have blisters that did not change into scabs yet, try not to touch other people or be around other people, especially: Babies. Pregnant women. Children who have areas of red, itchy, or rough skin (eczema). Older people who have organ transplants. People who have a long-term (chronic) illness, like cancer or AIDS. Keep all follow-up visits. How is this prevented? A vaccine shot is the best way to prevent shingles and protect against shingles problems. If you have not had a vaccine shot, talk with your doctor about getting it. Where to find more information Centers for Disease Control and Prevention: [employer] Contact a doctor if: Your pain does not get better with medicine. Your pain does not get better after the rash heals. You have any of these signs of infection around the rash: More redness, swelling, or pain. Fluid or blood. Warmth. Pus or a bad smell. You have a fever. Get help right away if: The rash is on your face or nose. You have pain in your face or pain by your eye. You lose feeling on one side of your face. You have trouble seeing. You have ear pain, or you have ringing in your ear. You have a loss of taste. Your condition gets worse. Summary Shingles gives you a painful skin rash and blisters that have fluid in them. Shingles is caused by the same germ (virus) that causes chickenpox. Keep your rash covered with a loose bandage. Wear loose clothing that does not rub on your rash. If you have blisters that did not change into scabs yet, try not to touch other people or be around people. This information is not intended to replace advice given to you by your health care provider. Make sure you discuss any questions you have with your health care provider. Document Revised: 04/01/2020 Document Reviewed:  04/01/2020 Elsevier Patient Education  2024 [employer].

## 2022-09-29 NOTE — Telephone Encounter (Signed)
I provided Efrain Sella, NP the following information to share with the patient about scheduling a follow up appointment at [facility]:  He can call [facility] : [phone] or go in person to: [address], [place] between 0800-1500 Mon-Fri. They will be able to start the registration/ intake process. I was informed that this process can take up to 2 hours. The office is closed for lunch from 1200-1300.

## 2022-09-29 NOTE — Progress Notes (Unsigned)
[facility]   Subjective:  [name] is a 57 y.o. male presents for hospital follow up and establish care. Mom called 911 because they were suppose to go shopping and he was not answering her calls. She called 911 and [name] [name] arrived first on the screen he was in his back yard.  Admit date to the hospital was 09/15/22, patient was discharged from the hospital on 09/21/22, patient was admitted for: suicidal attempt. Discussed what pushed him this far  Patient reports "I have been battling depression my whole life" this time hopelessness, no purpose, feeling better off being dead, He will never do this again because of his purpose his mother, most importantly his son asked him :dad your trying not to see me again. I need you to still help me grow up. He also has sisters and brothers. He does not use any illicit drugs or alcohol. He is feeling better today states changes in his medication has helped. Patient has No headache, No chest pain, No abdominal pain - No Nausea, No new weakness tingling or numbness, No Cough - shortness of breath   Past Medical History:  Diagnosis Date   Depression    2009   Depression    Fracture of multiple toes 09/25/2014   right foot   History of cellulitis 10/21/2014   right foot   Numbness in right leg 09/25/2014   from knee down, per pt.     No Known Allergies    Current Outpatient Medications on File Prior to Visit  Medication Sig Dispense Refill   acetaminophen (TYLENOL) 325 MG tablet Take 2 tablets (650 mg total) by mouth every 6 (six) hours as needed for mild pain.     docusate sodium (COLACE) 100 MG capsule Take 1 capsule (100 mg total) by mouth 2 (two) times daily as needed for mild constipation. 10 capsule 0   escitalopram (LEXAPRO) 10 MG tablet Take 1 tablet (10 mg total) by mouth daily. 30 tablet 0   hydrOXYzine (ATARAX) 25 MG tablet Take 1 tablet (25 mg total) by mouth 3 (three) times daily as needed for anxiety. 30 tablet 0    ibuprofen (ADVIL,MOTRIN) 600 MG tablet Take 1 tablet (600 mg total) by mouth every 6 (six) hours as needed. (Patient not taking: Reported on 02/14/2015) 30 tablet 0   polyethylene glycol (MIRALAX / GLYCOLAX) 17 g packet Take 17 g by mouth daily as needed (constipation). 14 each 0   traZODone (DESYREL) 50 MG tablet Take 1 tablet (50 mg total) by mouth at bedtime as needed for sleep. 30 tablet 0   No current facility-administered medications on file prior to visit.     Review of System: Comprehensive ROS Pertinent positive and negative noted in HPI    Objective:  Blood Pressure 122/80   Pulse 66   Respiration 16   Height 5\' 8"  (1.727 m)   Weight 188 lb 3.2 oz (85.4 kg)   Oxygen Saturation 99%   Body Mass Index 28.62 kg/m   Filed Weights   09/29/22 0958  Weight: 188 lb 3.2 oz (85.4 kg)    Physical Exam: General Appearance: Well nourished, in no apparent distress. Eyes: PERRLA, EOMs, conjunctiva no swelling or erythema Sinuses: No Frontal/maxillary tenderness ENT/Mouth: Ext aud canals clear, TMs without erythema, bulging. No erythema, swelling, or exudate on post pharynx. Poor dentation. Hearing normal.  Neck: Supple, thyroid normal.  Respiratory: Respiratory effort normal, BS equal bilaterally without rales, rhonchi, wheezing or stridor.  Cardio: RRR  with no MRGs. Brisk peripheral pulses without edema.  Abdomen: Soft, + BS.  Non tender, no guarding, rebound, hernias, masses. Lymphatics: Non tender without lymphadenopathy.  Musculoskeletal: Full ROM, 5/5 strength, normal gait.  Skin: Warm, dry without rashes, lesions, ecchymosis.  Neuro: Cranial nerves intact. Normal muscle tone, no cerebellar symptoms. Sensation intact.  Psych: Awake and oriented X 3, normal affect, Insight and Judgment appropriate.    Assessment:  [name] [name] was seen today for hospitalization follow-up.  Diagnoses and all orders for this visit:  Encounter to establish care  MDD (major depressive disorder),  recurrent severe, without psychosis (HCC)  2/2 GAD (generalized anxiety disorder) Contacted [facility] for f/u appt with assistance if CNM  Colon cancer screening -     Cologuard    This note has been created with Education officer, environmental. Any transcriptional errors are unintentional.   [name] Sessions, NP 09/29/2022, 10:19 AM

## 2022-12-30 ENCOUNTER — Ambulatory Visit (INDEPENDENT_AMBULATORY_CARE_PROVIDER_SITE_OTHER): Payer: Self-pay | Admitting: Primary Care
